# Patient Record
Sex: Male | Born: 1965 | Race: Black or African American | Hispanic: No | Marital: Single | Smoking: Never smoker
Health system: Southern US, Community
[De-identification: ages and names within clinical notes are randomized; demographics above are authoritative.]

## PROBLEM LIST (undated history)

## (undated) HISTORY — PX: SKIN GRAFT: SHX250

## (undated) HISTORY — PX: APPENDECTOMY: SHX54

---

## 2013-04-05 ENCOUNTER — Emergency Department (HOSPITAL_COMMUNITY)
Admission: EM | Admit: 2013-04-05 | Discharge: 2013-04-05 | Disposition: A | Discharge status: Home / Self Care | Payer: BC Managed Care – PPO | Attending: Emergency Medicine | Admitting: Emergency Medicine

## 2013-04-05 ENCOUNTER — Encounter (HOSPITAL_COMMUNITY): Payer: Self-pay | Admitting: Emergency Medicine

## 2013-04-05 DIAGNOSIS — B9789 Other viral agents as the cause of diseases classified elsewhere: Secondary | ICD-10-CM | POA: Insufficient documentation

## 2013-04-05 DIAGNOSIS — J3489 Other specified disorders of nose and nasal sinuses: Secondary | ICD-10-CM | POA: Insufficient documentation

## 2013-04-05 DIAGNOSIS — J029 Acute pharyngitis, unspecified: Secondary | ICD-10-CM

## 2013-04-05 LAB — RAPID STREP SCREEN (MED CTR MEBANE ONLY): Streptococcus, Group A Screen (Direct): NEGATIVE

## 2013-04-05 NOTE — ED Notes (Signed)
Pt dc to home. Pt sts understanding to dc instructions. Pt ambulatory to exit without difficulty. 

## 2013-04-05 NOTE — ED Provider Notes (Signed)
History  This chart was scribed for non-physician practitioner Magnus Sinning, working with Doug Sou, MD, by Yevette Edwards, ED Scribe. This patient was seen in room TR08C/TR08C and the patient's care was started at 6:35 PM.   CSN: [id number]  Arrival date & time 04/05/13  1600   First MD Initiated Contact with Patient 04/05/13 1726      Chief Complaint  Patient presents with  . Sore Throat    The history is provided by the patient. No language interpreter was used.   HPI Comments: Greg Bean is a 47 y.o. male who presents to the Emergency Department complaining of gradual onset, constant pain to the left side of his throat which began three days ago and which became worse this morning. He states that he felt that the pain in his throat is worse when he swallows.  He denies any fever, ear pain, difficulty swallowing, difficulty speaking, SOB, cough, or a headache. Last week, he reports that he had a runny nose. He states that he took Goody's  this morning as well as half of a norco leftover from his root canal, but neither mitigated the pain.   History reviewed. No pertinent past medical history.  Past Surgical History  Procedure Laterality Date  . Skin graft    . Appendectomy      No family history on file.  History  Substance Use Topics  . Smoking status: Never Smoker   . Smokeless tobacco: Not on file  . Alcohol Use: Yes      Review of Systems  Constitutional: Negative for fever and chills.  HENT: Positive for sore throat and rhinorrhea. Negative for ear pain, congestion, trouble swallowing and sinus pressure.     Allergies  Review of patient's allergies indicates no known allergies.  Home Medications  No current outpatient prescriptions on file.  Triage Vitals. BP 153/84  Pulse 83  Temp(Src) 98.7 F (37.1 C) (Oral)  Resp 20  SpO2 100%  Physical Exam  Nursing note and vitals reviewed. Constitutional: He is oriented to person, place, and  time. He appears well-developed and well-nourished. No distress.  HENT:  Head: Normocephalic and atraumatic. No trismus in the jaw.  Right Ear: Tympanic membrane, external ear and ear canal normal.  Mouth/Throat: Uvula is midline and mucous membranes are normal. No edematous. Posterior oropharyngeal erythema present. No oropharyngeal exudate, posterior oropharyngeal edema or tonsillar abscesses.  Some erythema in his throat. No exudate and no edema. Normal voice phonation.  No drooling.  Handling secretions well.  Eyes: EOM are normal.  Neck: Normal range of motion. Neck supple. No tracheal deviation present.  Cardiovascular: Normal rate, regular rhythm and normal heart sounds.   Pulmonary/Chest: Effort normal and breath sounds normal. No respiratory distress.  Musculoskeletal: Normal range of motion.  Lymphadenopathy:    He has cervical adenopathy.  Anterior cervical lymph nodes   Neurological: He is alert and oriented to person, place, and time.  Skin: Skin is warm and dry.  Psychiatric: He has a normal mood and affect. His behavior is normal.    ED Course  Procedures (including critical care time)  DIAGNOSTIC STUDIES: Oxygen Saturation is 100% on room air, normal by my interpretation.    COORDINATION OF CARE:  6:36 PM- Discussed the pt's enlarged lymph nodes which indicates a possible infection. Informed pt that it is most likely not a sinus infection or an ear infection, but that a rapid strep culture will be done. Explained that if it is not  strep, then it may be a viral infection. Pt agreed.   6:59 PM- Informed pt that his strep test was negative.     Labs Reviewed  RAPID STREP SCREEN  CULTURE, GROUP A STREP   No results found.   No diagnosis found.    MDM  Patient presents with a chief complaint of sore throat.  Rapid strep negative.  No drooling, difficulty speaking, or difficulty swallowing.  Most likely Viral Pharyngitis.  Pt stable for discharge.  Return  precautions discussed.  I personally performed the services described in this documentation, which was scribed in my presence. The recorded information has been reviewed and is accurate.    Pascal Lux Brandt, PA-C 04/05/13 1924

## 2013-04-05 NOTE — ED Notes (Signed)
Pt c/o sore throat x 3 days. Pt has been taking ibuprofen with some relief. Pt denies shortness of breath. Pt talking in complete sentences without difficulty.

## 2013-04-05 NOTE — ED Notes (Signed)
PA at bedside.

## 2013-04-06 NOTE — ED Provider Notes (Signed)
Medical screening examination/treatment/procedure(s) were performed by non-physician practitioner and as supervising physician I was immediately available for consultation/collaboration.  Azavier Creson, MD 04/06/13 0128 

## 2013-04-07 LAB — CULTURE, GROUP A STREP

## 2013-10-30 ENCOUNTER — Emergency Department (HOSPITAL_COMMUNITY)
Admission: EM | Admit: 2013-10-30 | Discharge: 2013-10-30 | Disposition: A | Discharge status: Home / Self Care | Payer: BC Managed Care – PPO | Attending: Emergency Medicine | Admitting: Emergency Medicine

## 2013-10-30 ENCOUNTER — Encounter (HOSPITAL_COMMUNITY): Payer: Self-pay | Admitting: Emergency Medicine

## 2013-10-30 DIAGNOSIS — G479 Sleep disorder, unspecified: Secondary | ICD-10-CM | POA: Insufficient documentation

## 2013-10-30 DIAGNOSIS — K649 Unspecified hemorrhoids: Secondary | ICD-10-CM

## 2013-10-30 MED ORDER — POLYETHYLENE GLYCOL 3350 17 GM/SCOOP PO POWD: 17.0000 g | Freq: Two times a day (BID) | ORAL | Status: DC

## 2013-10-30 MED ORDER — OXYCODONE-ACETAMINOPHEN 5-325 MG PO TABS: 2.0000 | ORAL_TABLET | ORAL | Status: DC | PRN

## 2013-10-30 MED ORDER — HYDROCORTISONE 2.5 % RE CREA: TOPICAL_CREAM | RECTAL | Status: DC

## 2013-10-30 NOTE — ED Provider Notes (Signed)
CSN: [id number]     Arrival date & time 10/30/13  1429 History  This chart was scribed for non-physician practitioner working with Audree CamelScott T Goldston, MD by Ashley JacobsBrittany Andrews, ED scribe. This patient was seen in room TR11C/TR11C and the patient's care was started at 3:58 PM.   First MD Initiated Contact with Patient 10/30/13 1511     Chief Complaint  Patient presents with  . Rectal Pain   (Consider location/radiation/quality/duration/timing/severity/associated sxs/prior Treatment) The history is provided by the patient and medical records. No language interpreter was used.   HPI Comments: Greg Bean is a 48 y.o. male who presents to the Emergency Department complaining of constant, moderate, rectal pain for the past three days. Pt states having pain while passing stool. Pt has tried ibuprofen with no relief. He does not have any known allergies to medications. While sleeping at night the pain is worse. Pt does not have any pertinent medical complications. He denies complications with straining while passing stool while passing stool. 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  History reviewed. No pertinent past medical history. Past Surgical History  Procedure Laterality Date  . Skin graft    . Appendectomy     No family history on file. History  Substance Use Topics  . Smoking status: Never Smoker   . Smokeless tobacco: Not on file  . Alcohol Use: Yes    Review of Systems  Gastrointestinal: Positive for rectal pain.  Psychiatric/Behavioral: Positive for sleep disturbance.    Allergies  Review of patient's allergies indicates no known allergies.  Home Medications  No current outpatient prescriptions on file. BP 146/97  Pulse 87  Temp(Src) 98.5 F (36.9 C)  Resp 16  SpO2 98% Physical Exam  Nursing note and vitals reviewed. Constitutional: He is oriented to person, place, and time. He appears well-developed and well-nourished. No distress.  HENT:  Head:  Normocephalic and atraumatic.  Mouth/Throat: Oropharynx is clear and moist.  Eyes: Conjunctivae are normal. Pupils are equal, round, and reactive to light. No scleral icterus.  Neck: Neck supple.  Cardiovascular: Normal rate.   Pulmonary/Chest: Effort normal and breath sounds normal. No stridor. No respiratory distress.  Abdominal: Soft.  Genitourinary:  Grape sized hemorrhoid at the 10 o'clock position Not thrombosed Tender to palpate  Musculoskeletal: Normal range of motion.  Neurological: He is alert and oriented to person, place, and time.  Skin: Skin is warm and dry.  Psychiatric: He has a normal mood and affect. His behavior is normal.    ED Course  Procedures (including critical care time) DIAGNOSTIC STUDIES: Oxygen Saturation is 98% on room air, normal by my interpretation.    COORDINATION OF CARE:  4:02 PM Discussed course of care with pt . Pt understands and agrees.    Labs Review Labs Reviewed - No data to display Imaging Review No results found.  EKG Interpretation   None       MDM   1. Hemorrhoids    4:06 PM Patient has hemorrhoids. No thrombosis noted. Patient will be discharged with percocet for pain, anusol and miralax. Patient advised to follow up with his PCP. Vitals stable and patient afebrile.   I personally performed the services described in this documentation, which was scribed in my presence. The recorded information has been reviewed and is accurate.     Emilia BeckKaitlyn Levander Katzenstein, PA-C 10/30/13 1606

## 2013-10-30 NOTE — ED Provider Notes (Signed)
Medical screening examination/treatment/procedure(s) were performed by non-physician practitioner and as supervising physician I was immediately available for consultation/collaboration.  EKG Interpretation   None         Audree CamelScott T Chauncey Sciulli, MD 10/30/13 [phone]

## 2013-10-30 NOTE — ED Notes (Signed)
Has had rectal pain since Monday   Thinks he has a Hemorid  Motrin not helping

## 2013-10-30 NOTE — Discharge Instructions (Signed)
Take Percocet as needed for pain. Take miralax to help stools pass more comfortably. Use anusol as directed for pain and inflammation relief. Refer to attached documents for more information. Follow up with your doctor for further evaluation.

## 2020-12-01 ENCOUNTER — Other Ambulatory Visit: Payer: Self-pay | Admitting: Gastroenterology

## 2021-02-20 ENCOUNTER — Emergency Department (HOSPITAL_COMMUNITY): Payer: 59

## 2021-02-20 ENCOUNTER — Encounter (HOSPITAL_COMMUNITY): Payer: Self-pay | Admitting: Emergency Medicine

## 2021-02-20 ENCOUNTER — Emergency Department (HOSPITAL_COMMUNITY)
Admission: EM | Admit: 2021-02-20 | Discharge: 2021-02-20 | Disposition: A | Discharge status: Home / Self Care | Payer: 59 | Attending: Emergency Medicine | Admitting: Emergency Medicine

## 2021-02-20 ENCOUNTER — Other Ambulatory Visit: Payer: Self-pay

## 2021-02-20 DIAGNOSIS — M25511 Pain in right shoulder: Secondary | ICD-10-CM | POA: Diagnosis present

## 2021-02-20 DIAGNOSIS — R030 Elevated blood-pressure reading, without diagnosis of hypertension: Secondary | ICD-10-CM

## 2021-02-20 MED ORDER — TRAMADOL HCL 50 MG PO TABS: 50.0000 mg | ORAL_TABLET | Freq: Four times a day (QID) | ORAL | 0 refills | Status: DC | PRN

## 2021-02-20 MED ORDER — IBUPROFEN 400 MG PO TABS
400.0000 mg | ORAL_TABLET | Freq: Once | ORAL | Status: AC
  Administered 2021-02-20: 400 mg via ORAL
  Filled 2021-02-20: qty 1

## 2021-02-20 MED ORDER — TRAMADOL HCL 50 MG PO TABS
50.0000 mg | ORAL_TABLET | Freq: Once | ORAL | Status: AC
  Administered 2021-02-20: 50 mg via ORAL
  Filled 2021-02-20: qty 1

## 2021-02-20 NOTE — ED Triage Notes (Addendum)
R shoulder pain x 3 days.  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  Pain only with movement.

## 2021-02-20 NOTE — ED Provider Notes (Signed)
[facility] EMERGENCY DEPARTMENT Provider Note   CSN: [id number] Arrival date & time: 02/20/21  1452     History Chief Complaint  Patient presents with  . Shoulder Pain    Greg Bean is a 55 y.o. male.  Patient c/o right shoulder pain for the past 3-4 days. Symptoms acute onset, moderate, dull, non radiating, constant, persistent, worse w certain movements including abduction. States works in [employer], and job does involve repetitive lifting and use of shoulders but denies specific trauma or injury. Denies neck/radicular pain. No elbow pain. No arm swelling. No skin changes, rash, redness or increased warmth to shoulder. Pt denies fever or chills (t 100 in ED). Denies other joint pain. States otherwise feels well, not ill.   The history is provided by the patient.  Shoulder Pain Associated symptoms: no back pain, no fever and no neck pain        History reviewed. No pertinent past medical history.  There are no problems to display for this patient.   Past Surgical History:  Procedure Laterality Date  . APPENDECTOMY    . SKIN GRAFT         No family history on file.  Social History   Tobacco Use  . Smoking status: Never Smoker  Substance Use Topics  . Alcohol use: Yes  . Drug use: No    Home Medications Prior to Admission medications   Medication Sig Start Date End Date Taking? Authorizing Provider  hydrocortisone (ANUSOL-HC) 2.5 % rectal cream Apply rectally 2 times daily 10/30/13   Emilia Beck, PA-C  oxyCODONE-acetaminophen (PERCOCET/ROXICET) 5-325 MG per tablet Take 2 tablets by mouth every 4 (four) hours as needed for severe pain. 10/30/13   Szekalski, Yvonna Alanis, PA-C  polyethylene glycol powder (GLYCOLAX/MIRALAX) powder Take 17 g by mouth 2 (two) times daily. Until daily soft stools  OTC 10/30/13   Emilia Beck, PA-C    Allergies    Patient has no known allergies.  Review of Systems   Review of Systems   Constitutional: Negative for chills, diaphoresis and fever.  HENT: Negative for sore throat.   Eyes: Negative for redness.  Respiratory: Negative for cough and shortness of breath.   Cardiovascular: Negative for chest pain.  Gastrointestinal: Negative for abdominal pain, nausea and vomiting.  Genitourinary: Negative for dysuria and flank pain.  Musculoskeletal: Negative for back pain and neck pain.  Skin: Negative for rash.  Neurological: Negative for weakness, numbness and headaches.  Hematological: Does not bruise/bleed easily.  Psychiatric/Behavioral: Negative for confusion.    Physical Exam Updated Vital Signs BP (!) 148/87 (BP Location: Left Arm)   Pulse 95   Temp (!) 100.4 F (38 C) (Oral)   Resp 16   SpO2 100%   Physical Exam Vitals and nursing note reviewed.  Constitutional:      Appearance: Normal appearance. He is well-developed.  HENT:     Head: Atraumatic.     Nose: Nose normal.     Mouth/Throat:     Mouth: Mucous membranes are moist.     Pharynx: Oropharynx is clear.  Eyes:     General: No scleral icterus.    Conjunctiva/sclera: Conjunctivae normal.  Neck:     Trachea: No tracheal deviation.     Comments: No stiffness or rigidity.  Cardiovascular:     Rate and Rhythm: Normal rate and regular rhythm.     Pulses: Normal pulses.     Heart sounds: Normal heart sounds. No murmur heard. No friction rub.  No gallop.   Pulmonary:     Effort: Pulmonary effort is normal. No accessory muscle usage or respiratory distress.     Breath sounds: Normal breath sounds.  Abdominal:     General: Bowel sounds are normal. There is no distension.     Palpations: Abdomen is soft.     Tenderness: There is no abdominal tenderness. There is no guarding.  Genitourinary:    Comments: No cva tenderness. Musculoskeletal:        General: No swelling.     Cervical back: Normal range of motion and neck supple. No rigidity.     Comments: Mild tenderness right shoulder. Skin of  region of normal warmth and color. No rash/lesions. Pain w active abduction. With passive rom shoulder, able to range comfortably with no severe/significant pain. No pain w rom elbow. No RUE swelling. Radial pulse 2+. C spine non tender, aligned, normal rom.   Lymphadenopathy:     Cervical: No cervical adenopathy.  Skin:    General: Skin is warm and dry.     Findings: No rash.  Neurological:     Mental Status: He is alert.     Comments: Alert, speech clear. RUE motor/sens grossly intact. stre 5/5.   Psychiatric:        Mood and Affect: Mood normal.     ED Results / Procedures / Treatments   Labs (all labs ordered are listed, but only abnormal results are displayed) Labs Reviewed - No data to display  EKG None  Radiology DG Shoulder Right  Result Date: 02/20/2021 CLINICAL DATA:  RIGHT shoulder pain. EXAM: RIGHT SHOULDER - 2+ VIEW COMPARISON:  None. FINDINGS: Osseous alignment is normal. No fracture line or displaced fracture fragment is seen. No acute-appearing cortical irregularity or osseous lesion. No significant degenerative change at the glenohumeral or acromioclavicular joint spaces. Surrounding soft tissues are unremarkable. IMPRESSION: Negative. Electronically Signed   By: Bary Richard M.D.   On: 02/20/2021 17:34    Procedures Procedures   Medications Ordered in ED Medications  ibuprofen (ADVIL) tablet 400 mg (has no administration in time range)  traMADol (ULTRAM) tablet 50 mg (has no administration in time range)    ED Course  I have reviewed the triage vital signs and the nursing notes.  Pertinent labs & imaging results that were available during my care of the patient were reviewed by me and considered in my medical decision making (see chart for details).    MDM Rules/Calculators/A&P                         Xrays. No meds today or pta.   Ibuprofen po. Ultram po.  Reviewed nursing notes and prior charts for additional history.   Xrays reviewed/interpreted  by me - no fx.   On recheck pt comfortable, no distress. On exam, no findings of septic joint.   Pt currently appears stable for d/c.   Rec pcp f/u.  Return precautions provided.    Final Clinical Impression(s) / ED Diagnoses Final diagnoses:  None    Rx / DC Orders ED Discharge Orders    None       Cathren Laine, MD 02/20/21 [phone]

## 2021-02-20 NOTE — Discharge Instructions (Addendum)
It was our pleasure to provide your ER care today - we hope that you feel better.  May try heat or cold therapy for symptom relief.  You may use sling for comfort as needed for the next 2-3 days.   Take ibuprofen as need for pain. You may also take ultram as need for pain - no driving for the next  6 hours, or when taking ultram.   Follow up with orthopedic doctor in 1 week - call office to arrange appointment.   Also follow up with primary care doctor in the next 1-2 weeks for recheck of blood pressure which is mildly high today.   Return to ER if worse, new symptoms, high fevers, increased swelling, redness, severe or intractable pain, or other concern.   You m

## 2021-03-04 ENCOUNTER — Encounter (HOSPITAL_COMMUNITY): Payer: Self-pay | Admitting: Internal Medicine

## 2021-03-04 ENCOUNTER — Emergency Department (HOSPITAL_COMMUNITY): Payer: 59

## 2021-03-04 ENCOUNTER — Inpatient Hospital Stay (HOSPITAL_COMMUNITY)
Admission: EM | Admit: 2021-03-04 | Discharge: 2021-03-23 | DRG: 853 | Disposition: E | Payer: 59 | Attending: Internal Medicine | Admitting: Internal Medicine

## 2021-03-04 ENCOUNTER — Other Ambulatory Visit: Payer: Self-pay

## 2021-03-04 DIAGNOSIS — R52 Pain, unspecified: Secondary | ICD-10-CM

## 2021-03-04 DIAGNOSIS — R0600 Dyspnea, unspecified: Secondary | ICD-10-CM

## 2021-03-04 DIAGNOSIS — K7200 Acute and subacute hepatic failure without coma: Secondary | ICD-10-CM | POA: Diagnosis not present

## 2021-03-04 DIAGNOSIS — R0902 Hypoxemia: Secondary | ICD-10-CM

## 2021-03-04 DIAGNOSIS — D6959 Other secondary thrombocytopenia: Secondary | ICD-10-CM | POA: Diagnosis present

## 2021-03-04 DIAGNOSIS — D696 Thrombocytopenia, unspecified: Secondary | ICD-10-CM

## 2021-03-04 DIAGNOSIS — R310 Gross hematuria: Secondary | ICD-10-CM | POA: Diagnosis present

## 2021-03-04 DIAGNOSIS — Z79899 Other long term (current) drug therapy: Secondary | ICD-10-CM

## 2021-03-04 DIAGNOSIS — R531 Weakness: Secondary | ICD-10-CM | POA: Diagnosis not present

## 2021-03-04 DIAGNOSIS — D62 Acute posthemorrhagic anemia: Secondary | ICD-10-CM | POA: Diagnosis present

## 2021-03-04 DIAGNOSIS — J9601 Acute respiratory failure with hypoxia: Secondary | ICD-10-CM | POA: Diagnosis not present

## 2021-03-04 DIAGNOSIS — K7030 Alcoholic cirrhosis of liver without ascites: Secondary | ICD-10-CM | POA: Diagnosis present

## 2021-03-04 DIAGNOSIS — A4101 Sepsis due to Methicillin susceptible Staphylococcus aureus: Secondary | ICD-10-CM | POA: Diagnosis present

## 2021-03-04 DIAGNOSIS — Z20822 Contact with and (suspected) exposure to covid-19: Secondary | ICD-10-CM | POA: Diagnosis present

## 2021-03-04 DIAGNOSIS — M659 Synovitis and tenosynovitis, unspecified: Secondary | ICD-10-CM | POA: Diagnosis present

## 2021-03-04 DIAGNOSIS — R0602 Shortness of breath: Secondary | ICD-10-CM

## 2021-03-04 DIAGNOSIS — S37011A Minor contusion of right kidney, initial encounter: Secondary | ICD-10-CM | POA: Diagnosis present

## 2021-03-04 DIAGNOSIS — B9561 Methicillin susceptible Staphylococcus aureus infection as the cause of diseases classified elsewhere: Secondary | ICD-10-CM

## 2021-03-04 DIAGNOSIS — D689 Coagulation defect, unspecified: Secondary | ICD-10-CM | POA: Diagnosis not present

## 2021-03-04 DIAGNOSIS — R6520 Severe sepsis without septic shock: Secondary | ICD-10-CM | POA: Diagnosis present

## 2021-03-04 DIAGNOSIS — D638 Anemia in other chronic diseases classified elsewhere: Secondary | ICD-10-CM | POA: Diagnosis present

## 2021-03-04 DIAGNOSIS — R7881 Bacteremia: Secondary | ICD-10-CM

## 2021-03-04 DIAGNOSIS — Z832 Family history of diseases of the blood and blood-forming organs and certain disorders involving the immune mechanism: Secondary | ICD-10-CM

## 2021-03-04 DIAGNOSIS — D649 Anemia, unspecified: Secondary | ICD-10-CM | POA: Diagnosis not present

## 2021-03-04 DIAGNOSIS — K7460 Unspecified cirrhosis of liver: Secondary | ICD-10-CM | POA: Diagnosis present

## 2021-03-04 DIAGNOSIS — G9340 Encephalopathy, unspecified: Secondary | ICD-10-CM | POA: Diagnosis present

## 2021-03-04 DIAGNOSIS — D72825 Bandemia: Secondary | ICD-10-CM | POA: Diagnosis not present

## 2021-03-04 DIAGNOSIS — R6510 Systemic inflammatory response syndrome (SIRS) of non-infectious origin without acute organ dysfunction: Secondary | ICD-10-CM | POA: Diagnosis not present

## 2021-03-04 DIAGNOSIS — M25539 Pain in unspecified wrist: Secondary | ICD-10-CM

## 2021-03-04 DIAGNOSIS — M609 Myositis, unspecified: Secondary | ICD-10-CM | POA: Diagnosis present

## 2021-03-04 DIAGNOSIS — N2889 Other specified disorders of kidney and ureter: Secondary | ICD-10-CM | POA: Diagnosis present

## 2021-03-04 DIAGNOSIS — R509 Fever, unspecified: Secondary | ICD-10-CM

## 2021-03-04 DIAGNOSIS — I76 Septic arterial embolism: Secondary | ICD-10-CM | POA: Diagnosis present

## 2021-03-04 DIAGNOSIS — N179 Acute kidney failure, unspecified: Secondary | ICD-10-CM | POA: Diagnosis present

## 2021-03-04 DIAGNOSIS — F1010 Alcohol abuse, uncomplicated: Secondary | ICD-10-CM | POA: Diagnosis present

## 2021-03-04 DIAGNOSIS — E871 Hypo-osmolality and hyponatremia: Secondary | ICD-10-CM | POA: Diagnosis present

## 2021-03-04 DIAGNOSIS — R7989 Other specified abnormal findings of blood chemistry: Secondary | ICD-10-CM | POA: Diagnosis present

## 2021-03-04 DIAGNOSIS — Z811 Family history of alcohol abuse and dependence: Secondary | ICD-10-CM | POA: Diagnosis not present

## 2021-03-04 DIAGNOSIS — I868 Varicose veins of other specified sites: Secondary | ICD-10-CM | POA: Diagnosis present

## 2021-03-04 DIAGNOSIS — N390 Urinary tract infection, site not specified: Secondary | ICD-10-CM | POA: Diagnosis present

## 2021-03-04 DIAGNOSIS — D65 Disseminated intravascular coagulation [defibrination syndrome]: Secondary | ICD-10-CM | POA: Diagnosis not present

## 2021-03-04 DIAGNOSIS — K7290 Hepatic failure, unspecified without coma: Secondary | ICD-10-CM | POA: Diagnosis present

## 2021-03-04 DIAGNOSIS — A419 Sepsis, unspecified organism: Secondary | ICD-10-CM | POA: Diagnosis not present

## 2021-03-04 DIAGNOSIS — N172 Acute kidney failure with medullary necrosis: Secondary | ICD-10-CM | POA: Diagnosis not present

## 2021-03-04 DIAGNOSIS — D61818 Other pancytopenia: Secondary | ICD-10-CM | POA: Diagnosis present

## 2021-03-04 DIAGNOSIS — Z8616 Personal history of COVID-19: Secondary | ICD-10-CM

## 2021-03-04 DIAGNOSIS — I469 Cardiac arrest, cause unspecified: Secondary | ICD-10-CM | POA: Diagnosis not present

## 2021-03-04 DIAGNOSIS — M00032 Staphylococcal arthritis, left wrist: Secondary | ICD-10-CM | POA: Diagnosis present

## 2021-03-04 DIAGNOSIS — K429 Umbilical hernia without obstruction or gangrene: Secondary | ICD-10-CM | POA: Diagnosis present

## 2021-03-04 LAB — LACTIC ACID, PLASMA
Lactic Acid, Venous: 2.9 mmol/L (ref 0.5–1.9)
Lactic Acid, Venous: 1.9 mmol/L (ref 0.5–1.9)

## 2021-03-04 LAB — CBC WITH DIFFERENTIAL/PLATELET
Abs Immature Granulocytes: 0.3 10*3/uL — ABNORMAL HIGH (ref 0.00–0.07)
Basophils Absolute: 0.1 10*3/uL (ref 0.0–0.1)
Basophils Relative: 0 %
Eosinophils Absolute: 0.1 10*3/uL (ref 0.0–0.5)
Eosinophils Relative: 0 %
HCT: 23.2 % — ABNORMAL LOW (ref 39.0–52.0)
Hemoglobin: 7.8 g/dL — ABNORMAL LOW (ref 13.0–17.0)
Immature Granulocytes: 2 %
Lymphocytes Relative: 9 %
Lymphs Abs: 1.8 10*3/uL (ref 0.7–4.0)
MCH: 30.6 pg (ref 26.0–34.0)
MCHC: 33.6 g/dL (ref 30.0–36.0)
MCV: 91 fL (ref 80.0–100.0)
Monocytes Absolute: 0.5 10*3/uL (ref 0.1–1.0)
Monocytes Relative: 3 %
Neutro Abs: 16.2 10*3/uL — ABNORMAL HIGH (ref 1.7–7.7)
Neutrophils Relative %: 86 %
Platelets: 62 10*3/uL — ABNORMAL LOW (ref 150–400)
RBC: 2.55 MIL/uL — ABNORMAL LOW (ref 4.22–5.81)
RDW: 17.1 % — ABNORMAL HIGH (ref 11.5–15.5)
WBC: 18.9 10*3/uL — ABNORMAL HIGH (ref 4.0–10.5)
nRBC: 0.2 % (ref 0.0–0.2)

## 2021-03-04 LAB — COMPREHENSIVE METABOLIC PANEL
ALT: 41 U/L (ref 0–44)
AST: 76 U/L — ABNORMAL HIGH (ref 15–41)
Albumin: 1.6 g/dL — ABNORMAL LOW (ref 3.5–5.0)
Alkaline Phosphatase: 81 U/L (ref 38–126)
Anion gap: 9 (ref 5–15)
BUN: 41 mg/dL — ABNORMAL HIGH (ref 6–20)
CO2: 22 mmol/L (ref 22–32)
Calcium: 7.7 mg/dL — ABNORMAL LOW (ref 8.9–10.3)
Chloride: 95 mmol/L — ABNORMAL LOW (ref 98–111)
Creatinine, Ser: 1.5 mg/dL — ABNORMAL HIGH (ref 0.61–1.24)
GFR, Estimated: 55 mL/min — ABNORMAL LOW (ref >60)
Glucose, Bld: 108 mg/dL — ABNORMAL HIGH (ref 70–99)
Potassium: 3.9 mmol/L (ref 3.5–5.1)
Sodium: 126 mmol/L — ABNORMAL LOW (ref 135–145)
Total Bilirubin: 5.1 mg/dL — ABNORMAL HIGH (ref 0.3–1.2)
Total Protein: 8.3 g/dL — ABNORMAL HIGH (ref 6.5–8.1)

## 2021-03-04 LAB — URINALYSIS, ROUTINE W REFLEX MICROSCOPIC
Bilirubin Urine: NEGATIVE
Glucose, UA: 50 mg/dL — AB
Ketones, ur: NEGATIVE mg/dL
Leukocytes,Ua: NEGATIVE
Nitrite: NEGATIVE
Protein, ur: 100 mg/dL — AB
RBC / HPF: >50 RBC/hpf — ABNORMAL HIGH (ref 0–5)
Specific Gravity, Urine: 1.011 (ref 1.005–1.030)
pH: 6 (ref 5.0–8.0)

## 2021-03-04 LAB — RETICULOCYTES
Immature Retic Fract: 28 % — ABNORMAL HIGH (ref 2.3–15.9)
RBC.: 2.56 MIL/uL — ABNORMAL LOW (ref 4.22–5.81)
Retic Count, Absolute: 64 10*3/uL (ref 19.0–186.0)
Retic Ct Pct: 2.5 % (ref 0.4–3.1)

## 2021-03-04 LAB — RAPID URINE DRUG SCREEN, HOSP PERFORMED
Amphetamines: NOT DETECTED
Barbiturates: NOT DETECTED
Benzodiazepines: NOT DETECTED
Cocaine: NOT DETECTED
Opiates: NOT DETECTED
Tetrahydrocannabinol: NOT DETECTED

## 2021-03-04 LAB — RESP PANEL BY RT-PCR (FLU A&B, COVID) ARPGX2
Influenza A by PCR: NEGATIVE
Influenza B by PCR: NEGATIVE
SARS Coronavirus 2 by RT PCR: NEGATIVE

## 2021-03-04 LAB — ABO/RH: ABO/RH(D): B POS

## 2021-03-04 LAB — PROTIME-INR
INR: 2.7 — ABNORMAL HIGH (ref 0.8–1.2)
Prothrombin Time: 28.7 seconds — ABNORMAL HIGH (ref 11.4–15.2)

## 2021-03-04 LAB — PREPARE RBC (CROSSMATCH)

## 2021-03-04 LAB — MAGNESIUM: Magnesium: 2 mg/dL (ref 1.7–2.4)

## 2021-03-04 LAB — AMMONIA: Ammonia: 48 umol/L — ABNORMAL HIGH (ref 9–35)

## 2021-03-04 LAB — LIPASE, BLOOD: Lipase: 47 U/L (ref 11–51)

## 2021-03-04 LAB — ETHANOL: Alcohol, Ethyl (B): <10 mg/dL (ref <10)

## 2021-03-04 LAB — APTT: aPTT: 35 seconds (ref 24–36)

## 2021-03-04 LAB — FOLATE: Folate: 11 ng/mL (ref >5.9)

## 2021-03-04 MED ORDER — SODIUM CHLORIDE 0.9% IV SOLUTION: Freq: Once | INTRAVENOUS | Status: AC

## 2021-03-04 MED ORDER — ONDANSETRON HCL 4 MG/2ML IJ SOLN
4.0000 mg | Freq: Four times a day (QID) | INTRAMUSCULAR | Status: DC | PRN
  Administered 2021-03-10: 4 mg via INTRAVENOUS
  Filled 2021-03-04: qty 2

## 2021-03-04 MED ORDER — ACETAMINOPHEN 325 MG PO TABS: 650.0000 mg | ORAL_TABLET | Freq: Four times a day (QID) | ORAL | Status: DC | PRN

## 2021-03-04 MED ORDER — ALBUMIN HUMAN 25 % IV SOLN
50.0000 g | Freq: Once | INTRAVENOUS | Status: DC
  Filled 2021-03-04: qty 200

## 2021-03-04 MED ORDER — ACETAMINOPHEN 650 MG RE SUPP: 650.0000 mg | Freq: Four times a day (QID) | RECTAL | Status: DC | PRN

## 2021-03-04 MED ORDER — PHYTONADIONE 5 MG PO TABS: 2.5000 mg | ORAL_TABLET | Freq: Once | ORAL | Status: DC

## 2021-03-04 MED ORDER — FENTANYL CITRATE (PF) 100 MCG/2ML IJ SOLN
25.0000 ug | INTRAMUSCULAR | Status: DC | PRN
  Administered 2021-03-05 – 2021-03-07 (×4): 50 ug via INTRAVENOUS
  Administered 2021-03-10: 25 ug via INTRAVENOUS
  Administered 2021-03-10: 50 ug via INTRAVENOUS
  Filled 2021-03-04 (×6): qty 2

## 2021-03-04 MED ORDER — ALBUMIN HUMAN 25 % IV SOLN: 12.5000 g | Freq: Once | INTRAVENOUS | Status: DC

## 2021-03-04 MED ORDER — LACTATED RINGERS IV BOLUS
1000.0000 mL | Freq: Once | INTRAVENOUS | Status: AC
  Administered 2021-03-04: 1000 mL via INTRAVENOUS

## 2021-03-04 MED ORDER — PHYTONADIONE 5 MG PO TABS
10.0000 mg | ORAL_TABLET | Freq: Once | ORAL | Status: AC
  Administered 2021-03-04: 10 mg via ORAL
  Filled 2021-03-04: qty 2

## 2021-03-04 MED ORDER — LACTATED RINGERS IV BOLUS (SEPSIS)
1000.0000 mL | Freq: Once | INTRAVENOUS | Status: AC
  Administered 2021-03-04: 1000 mL via INTRAVENOUS

## 2021-03-04 MED ORDER — ONDANSETRON HCL 4 MG PO TABS: 4.0000 mg | ORAL_TABLET | Freq: Four times a day (QID) | ORAL | Status: DC | PRN

## 2021-03-04 MED ORDER — ACETAMINOPHEN 325 MG PO TABS
650.0000 mg | ORAL_TABLET | Freq: Once | ORAL | Status: AC
  Administered 2021-03-04: 650 mg via ORAL
  Filled 2021-03-04: qty 2

## 2021-03-04 MED ORDER — LACTATED RINGERS IV SOLN: INTRAVENOUS | Status: AC

## 2021-03-04 MED ORDER — FENTANYL CITRATE (PF) 100 MCG/2ML IJ SOLN
50.0000 ug | Freq: Once | INTRAMUSCULAR | Status: AC
Start: 2021-03-04 — End: 2021-03-04
  Administered 2021-03-04: 50 ug via INTRAVENOUS
  Filled 2021-03-04: qty 2

## 2021-03-04 MED ORDER — SODIUM CHLORIDE 0.9 % IV SOLN
1.0000 g | INTRAVENOUS | Status: DC
  Administered 2021-03-04: 1 g via INTRAVENOUS
  Filled 2021-03-04: qty 10

## 2021-03-04 NOTE — ED Triage Notes (Signed)
Patient arrives via EMS- family reported that the patient has had weight loss over the last 2 weeks and has appeared weak.  Reported that the patient had Covid in January and has not felt normal since.  The patient reports weakness, moments of confusion and urinating blood x 2 weeks

## 2021-03-04 NOTE — Progress Notes (Signed)
Case reviewed with ER provider.  Recommend IVF resuscitation to improve renal function before proceeding with CT angio, start pRBC transfusion, correct INR and continue Rocephin for empiric abx coverage.  No acute surgical intervention from a GU standpoint at this time. If hemorrhaging persists, IR will need to be consulted for angioembolization. Will continue to follow.

## 2021-03-04 NOTE — H&P (Addendum)
History and Physical    Greg Bean ZOX:[id number] DOB: [birth year] DOA: 28-Sep-2021  PCP: Pcp, No  Patient coming from: Home  I have personally briefly reviewed patient's [facility]  Chief Complaint: Hematuria  HPI: Greg Bean is a 55 y.o. male with medical history significant of no known chronic medical conditions.  Does admit to drinking EtOH daily prior to COVID illness a few months ago, but no drinking since that time.  Around Nov he developed intermittent bouts of hematuria.  These have persisted, worsened, now has had hematuria for past 2 weeks straight.  Also reports wt loss and confusion during that time frame.  Associated flank pain.  No subjective fever, dysuria, abd pain, CP, SOB, N/V/D   ED Course: Tm 100.7, WBC 18.9k.  HR 146, improved to 104 after 2L bolus.  RR 31, creat 1.5, BUN 41, hgb 7.8, platelets 62.  INR 2.7, lactate 2.9, AST 76, ALT 41, Tbili 5.1.  UA with mod HGB, > 50 RBC, gross hematuria.  CT AP w/o contrast: 1) cirrhosis, with splenic varicies 2) 5.x cm enhancing R renal lesion suggestive of hematoma, recd repeating CTA AP.  Urology: try to improve renal fxn to GFR > 60 then get CTA.  GI to see in AM.   Review of Systems: As per HPI, otherwise all review of systems negative.  No past medical history on file.  Past Surgical History:  Procedure Laterality Date  . APPENDECTOMY    . SKIN GRAFT       reports that he has never smoked. He does not have any smokeless tobacco history on file. He reports previous alcohol use. He reports that he does not use drugs.  No Known Allergies  Family History  Problem Relation Age of Onset  . Cirrhosis Father   . Alcohol abuse Father      Prior to Admission medications   Medication Sig Start Date End Date Taking? Authorizing Provider  hydrocortisone (ANUSOL-HC) 2.5 % rectal cream Apply rectally 2 times daily 10/30/13   Emilia BeckSzekalski, Kaitlyn, PA-C   oxyCODONE-acetaminophen (PERCOCET/ROXICET) 5-325 MG per tablet Take 2 tablets by mouth every 4 (four) hours as needed for severe pain. 10/30/13   Szekalski, Yvonna AlanisKaitlyn, PA-C  polyethylene glycol powder (GLYCOLAX/MIRALAX) powder Take 17 g by mouth 2 (two) times daily. Until daily soft stools  OTC 10/30/13   Szekalski, ChamberlayneKaitlyn, PA-C  traMADol (ULTRAM) 50 MG tablet Take 1 tablet (50 mg total) by mouth every 6 (six) hours as needed. 02/20/21   Cathren LaineSteinl, Kevin, MD    Physical Exam: Vitals:   01/20/21 2015 01/20/21 2030 01/20/21 2045 01/20/21 2100  BP: 95/64 (!) 104/58 (!) 105/59 (!) 110/58  Pulse: (!) 103 (!) 103 (!) 103 (!) 104  Resp: (!) 31 (!) 30 (!) 30 (!) 31  Temp:      TempSrc:      SpO2: 93% 92% 93% 92%  Weight:      Height:        Constitutional: NAD, calm, comfortable Eyes: PERRL, lids and conjunctivae normal ENMT: Mucous membranes are moist. Posterior pharynx clear of any exudate or lesions.Normal dentition.  Neck: normal, supple, no masses, no thyromegaly Respiratory: clear to auscultation bilaterally, no wheezing, no crackles. Normal respiratory effort. No accessory muscle use.  Cardiovascular: Regular rate and rhythm, no murmurs / rubs / gallops. No extremity edema. 2+ pedal pulses. No carotid bruits.  Abdomen: no tenderness, no masses palpated. No hepatosplenomegaly. Bowel sounds positive.  Musculoskeletal: no clubbing /  cyanosis. No joint deformity upper and lower extremities. Good ROM, no contractures. Normal muscle tone.  Skin: no rashes, lesions, ulcers. No induration Neurologic: CN 2-12 grossly intact. Sensation intact, DTR normal. Strength 5/5 in all 4.  Psychiatric: Normal judgment and insight. Alert and oriented x 3. Normal mood.    Labs on Admission: I have personally reviewed following labs and imaging studies  CBC: Recent Labs  Lab 03/19/2021 1530  WBC 18.9*  NEUTROABS 16.2*  HGB 7.8*  HCT 23.2*  MCV 91.0  PLT 62*   Basic Metabolic Panel: Recent Labs  Lab  03/14/2021 1619  NA 126*  K 3.9  CL 95*  CO2 22  GLUCOSE 108*  BUN 41*  CREATININE 1.50*  CALCIUM 7.7*  MG 2.0   GFR: Estimated Creatinine Clearance: 47.5 mL/min (A) (by C-G formula based on SCr of 1.5 mg/dL (H)). Liver Function Tests: Recent Labs  Lab 02/27/2021 1619  AST 76*  ALT 41  ALKPHOS 81  BILITOT 5.1*  PROT 8.3*  ALBUMIN 1.6*   Recent Labs  Lab 02/21/2021 1619  LIPASE 47   No results for input(s): AMMONIA in the last 168 hours. Coagulation Profile: Recent Labs  Lab 03/02/2021 1530  INR 2.7*   Cardiac Enzymes: No results for input(s): CKTOTAL, CKMB, CKMBINDEX, TROPONINI in the last 168 hours. BNP (last 3 results) No results for input(s): PROBNP in the last 8760 hours. HbA1C: No results for input(s): HGBA1C in the last 72 hours. CBG: No results for input(s): GLUCAP in the last 168 hours. Lipid Profile: No results for input(s): CHOL, HDL, LDLCALC, TRIG, CHOLHDL, LDLDIRECT in the last 72 hours. Thyroid Function Tests: No results for input(s): TSH, T4TOTAL, FREET4, T3FREE, THYROIDAB in the last 72 hours. Anemia Panel: Recent Labs    03/20/2021 1530  RETICCTPCT 2.5   Urine analysis:    Component Value Date/Time   COLORURINE RED (A) 02/25/2021 1528   APPEARANCEUR HAZY (A) 03/02/2021 1528   LABSPEC 1.011 02/25/2021 1528   PHURINE 6.0 03/14/2021 1528   GLUCOSEU 50 (A) 02/26/2021 1528   HGBUR MODERATE (A) 03/14/2021 1528   BILIRUBINUR NEGATIVE 03/13/2021 1528   KETONESUR NEGATIVE 03/06/2021 1528   PROTEINUR 100 (A) 03/21/2021 1528   NITRITE NEGATIVE 03/03/2021 1528   LEUKOCYTESUR NEGATIVE 03/07/2021 1528    Radiological Exams on Admission: CT ABDOMEN PELVIS WO CONTRAST  Result Date: 03/21/2021 CLINICAL DATA:  Fever with hematuria EXAM: CT ABDOMEN AND PELVIS WITHOUT CONTRAST TECHNIQUE: Multidetector CT imaging of the abdomen and pelvis was performed following the standard protocol without oral or IV contrast. COMPARISON:  None. FINDINGS: Lower chest:  There are small pleural effusions bilaterally. There is scarring with lower lobe bronchiectatic change in the base regions. There are scattered foci of coronary artery calcification. Hepatobiliary: The liver contour is nodular, likely due to a degree of underlying hepatic cirrhosis. No focal liver lesions are appreciable on this noncontrast enhanced study. Gallbladder wall is not appreciably thickened. There is no biliary duct dilatation. Pancreas: There is no appreciable pancreatic mass or inflammatory focus. Spleen: No splenic lesions are evident. There are apparent varices in the splenic region. Adrenals/Urinary Tract: Adrenals bilaterally appear normal. The right kidney appears edematous. There is mild soft tissue stranding in the perinephric fascia on the right. There is an area of increased attenuation in the upper pole the right kidney measuring 5.5 x 5.3 x 5.2 cm. This lesion has an appearance suggesting focal hemorrhage within this area. This lesion may connect with the upper pole collecting system.  There is no hydronephrosis on the right. There is no demonstrable mass in the left kidney on this noncontrast enhanced study. No hydronephrosis. There is a 3 x 3 mm calculus in the lower pole the right kidney. There is no appreciable ureteral calculus on either side. Urinary bladder is midline with wall thickness within normal limits. Stomach/Bowel: There is no appreciable bowel wall or mesenteric thickening. No appreciable bowel obstruction. Terminal ileum appears normal. Appendix absent. No free air or portal venous air. Vascular/Lymphatic: No abdominal aortic aneurysm. Foci of calcification noted in each common iliac artery. No adenopathy is evident in the abdomen or pelvis. Subcentimeter inguinal lymph nodes are considered nonspecific. Reproductive: Occasional prostatic calculi noted. Prostate and seminal vesicles normal in size and contour. Other: There is a focal umbilical region hernia containing fat and  mild panniculitis. This hernia at its neck measures 1.4 cm from right to left dimension and 1.5 cm from superior to inferior dimension. No bowel containing hernia is evident. There is no abscess or ascites in the abdomen or pelvis. Musculoskeletal: No blastic or lytic bone lesions. No intramuscular lesions. IMPRESSION: 1. Area of increased attenuation in the upper pole of the right kidney measuring 5.5 x 5.3 x 5.2 cm which has an appearance concerning for localized hemorrhage. Etiology for focal hemorrhage in this area is uncertain. Hemorrhage within a mass or vascular malformation could present in this manner. This finding may warrant CT angiogram for further assessment and characterization. This lesion abuts and may invade a portion of the upper pole collecting system which could account for hematuria. Note that the right kidney appears edematous with mild stranding in the right perinephric fascia. 2. Nonobstructing calculus in the lower pole right kidney measuring 3 x 3 mm. 3. 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 No focal liver lesions evident on noncontrast enhanced study. 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 Spleen is not enlarged. 4. Umbilical hernia containing fat and mild panniculitis. No bowel containing hernia evident. 5. No evident bowel obstruction. No abscess in the abdomen or pelvis. Appendix absent. 6. Small pleural effusions bilaterally. Areas of scarring and bronchiectasis in the lung bases. 7.  Foci of iliac artery and coronary artery calcification. Electronically Signed   By: Bretta Bang III M.D.   On: 02/22/2021 20:08   DG Chest Port 1 View  Result Date: 03/02/2021 CLINICAL DATA:  Weakness EXAM: PORTABLE CHEST 1 VIEW COMPARISON:  None. FINDINGS: There is mild fullness of the AP window, possibly due to technique. Heart is normal in size. No pleural effusion. No pneumothorax. No acute pleuroparenchymal abnormality. Visualized  abdomen is unremarkable. No acute osseous abnormality noted. IMPRESSION: 1. Mild fullness of the AP window, likely due to technique. Consider repeat PA and lateral chest radiograph for improved evaluation. 2. Otherwise no acute cardiopulmonary abnormality. Electronically Signed   By: Meda Klinefelter MD   On: 02/20/2021 16:29    EKG: Independently reviewed.  Assessment/Plan Principal Problem:   Renal hemorrhage, right Active Problems:   SIRS (systemic inflammatory response syndrome) (HCC)   Cirrhosis of liver (HCC)   ABLA (acute blood loss anemia)   AKI (acute kidney injury) (HCC)    1. Renal hemorrhage - 1. Correcting coagulopathy as best as possible: 1. 3u FFP 2. 10mg  vit K 3. Repeat INR post transfusions 2. IVF: 2L LR bolus and LR at 100 3. See what albumin is post transfusions (FFP contains albumin I believe so will hold off on ordering  albumin for the moment). 4. Urology consulted: 1. Try to resuscitate for now 2. If creat improves, try to get CTA AP 3. No emergent surgery or IR just yet 2. ABLA - 1. 1u PRBC transfusion 2. Repeat CBC post transfusion 3. Cirrhosis of liver - 1. GI consult to see in AM 2. Repeat CMP post transfusions 3. Ammonia pending 4. AKI - 1. Likely combo of pre-renal due to above as well as direct renal injury due to the hemorrhage from R kidney 2. resuscitate as above and repeat CMP 5. SIRS - 1. Empiric rocephin for the moment 2. BCx 3. UCx  DVT prophylaxis: SCDs Code Status: Full Family Communication: No family in room Disposition Plan: Home after treatment for renal hemorrhage, cirrhosis work up etc Consults called: Dr. Ewing Schlein, Dr. Liliane Shi (GI and urology respectively) Admission status: Admit to inpatient  Severity of Illness: The appropriate patient status for this patient is INPATIENT. Inpatient status is judged to be reasonable and necessary in order to provide the required intensity of service to ensure the patient's safety. The  patient's presenting symptoms, physical exam findings, and initial radiographic and laboratory data in the context of their chronic comorbidities is felt to place them at high risk for further clinical deterioration. Furthermore, it is not anticipated that the patient will be medically stable for discharge from the hospital within 2 midnights of admission. The following factors support the patient status of inpatient.   IP status due to renal hemorrhage, ABLA requiring transfusions, coagulopathy from new dx of apparent cirrhosis, etc.   * I certify that at the point of admission it is my clinical judgment that the patient will require inpatient hospital care spanning beyond 2 midnights from the point of admission due to high intensity of service, high risk for further deterioration and high frequency of surveillance required.*    Greg Bean M. DO [facility]  How to contact the [facility] Attending or Consulting provider 7A - 7P or covering provider during after hours 7P -7A, for this patient?  1. Check the care team in [facility] and look for a) attending/consulting [facility] provider listed and b) the [facility] team listed 2. Log into [email or web address]  [employer] Physician Scheduling and messaging for groups and whole hospitals  On call and physician scheduling software for group practices, residents, hospitalists and other medical providers for call, clinic, rotation and shift schedules. On[employer] is a hospital-wide system for scheduling doctors and paging doctors on call. EasyPlot is for scientific plotting and data analysis.  [email or web address]  and use [place]'s universal password to access. If you do not have the password, please contact the hospital operator.  3. Locate the [facility] provider you are looking for under [facility] and page to a number that you can be directly reached. 4. If you still have difficulty reaching the provider, please page the [facility] (Director on Call) for the Hospitalists listed  on amion for assistance.  03/18/2021, 10:10 PM

## 2021-03-04 NOTE — ED Provider Notes (Signed)
[facility]-EMERGENCY DEPT Provider Note   CSN: [id number] Arrival date & time: 02/20/2021  1427     History Chief Complaint  Patient presents with  . Fever    Greg Bean is a 55 y.o. male.  HPI      Greg Bean is a 55 y.o. male, patient with no known past medical history., presenting to the ED with hematuria for the last 2 weeks. Also endorses fatigue, chills, loss of appetite, and body aches over the same time period. However, the above information was only obtained later in the interview.  Initially, patient states he came to the ED because "my girlfriend kept bugging me to get checked out because I was not eating." Denies any recent antibiotics.  Denies IV drug use, HIV, alcohol use, immune compromise.  Denies known tick bites.  Denies known fever at home, dysuria, rectal pain, abdominal pain, chest pain, shortness of breath, cough, back/flank pain, wounds, neck pain/stiffness, rashes, focused joint pain, N/V/D, or any other complaints.  History reviewed. No pertinent past medical history.  Patient Active Problem List   Diagnosis Date Noted  . Renal hemorrhage, right 02/25/2021  . SIRS (systemic inflammatory response syndrome) (HCC) 03/16/2021  . Cirrhosis of liver (HCC) 03/03/2021  . ABLA (acute blood loss anemia) 02/22/2021  . AKI (acute kidney injury) (HCC) 02/20/2021    Past Surgical History:  Procedure Laterality Date  . APPENDECTOMY    . SKIN GRAFT         Family History  Problem Relation Age of Onset  . Cirrhosis Father   . Alcohol abuse Father     Social History   Tobacco Use  . Smoking status: Never Smoker  Substance Use Topics  . Alcohol use: Not Currently    Comment: Daily drinker prior to Nov.  . Drug use: No    Home Medications Prior to Admission medications   Medication Sig Start Date End Date Taking? Authorizing Provider  hydrocortisone (ANUSOL-HC) 2.5 % rectal cream Apply rectally 2 times daily 10/30/13    Emilia Beck, PA-C  oxyCODONE-acetaminophen (PERCOCET/ROXICET) 5-325 MG per tablet Take 2 tablets by mouth every 4 (four) hours as needed for severe pain. 10/30/13   Szekalski, Yvonna Alanis, PA-C  polyethylene glycol powder (GLYCOLAX/MIRALAX) powder Take 17 g by mouth 2 (two) times daily. Until daily soft stools  OTC 10/30/13   Szekalski, [place], PA-C  traMADol (ULTRAM) 50 MG tablet Take 1 tablet (50 mg total) by mouth every 6 (six) hours as needed. 02/20/21   Cathren Laine, MD    Allergies    Patient has no known allergies.  Review of Systems   Review of Systems  Constitutional: Positive for appetite change, chills and fatigue.  HENT: Negative for congestion, dental problem, ear pain, facial swelling, sore throat, trouble swallowing and voice change.   Respiratory: Negative for cough and shortness of breath.   Gastrointestinal: Negative for abdominal pain, blood in stool, diarrhea, nausea and vomiting.  Genitourinary: Positive for hematuria. Negative for dysuria, flank pain, frequency, penile discharge, penile swelling, scrotal swelling and testicular pain.  Musculoskeletal: Positive for myalgias. Negative for back pain, neck pain and neck stiffness.  Skin: Negative for rash and wound.  Neurological: Negative for dizziness, seizures, syncope, speech difficulty and numbness.  All other systems reviewed and are negative.   Physical Exam Updated Vital Signs BP 125/76 (BP Location: Left Arm)   Pulse (!) 114   Temp (!) 100.7 F (38.2 C) (Oral)   Resp (!) 22  Ht  (1.753 m)   Wt 60.3 kg   SpO2 98%   BMI 19.64 kg/m   Physical Exam Vitals and nursing note reviewed.  Constitutional:      General: He is not in acute distress.    Appearance: He is well-developed. He is ill-appearing. He is not diaphoretic.  HENT:     Head: Normocephalic and atraumatic.     Mouth/Throat:     Mouth: Mucous membranes are moist.     Pharynx: Oropharynx is clear.  Eyes:     General: Scleral icterus  present.     Conjunctiva/sclera: Conjunctivae normal.  Cardiovascular:     Rate and Rhythm: Regular rhythm. Tachycardia present.     Pulses: Normal pulses.          Radial pulses are 2+ on the right side and 2+ on the left side.       Posterior tibial pulses are 2+ on the right side and 2+ on the left side.     Heart sounds: Normal heart sounds.     Comments: Tactile temperature in the extremities appropriate and equal bilaterally. Pulmonary:     Effort: Pulmonary effort is normal. No respiratory distress.     Breath sounds: Normal breath sounds.  Abdominal:     Palpations: Abdomen is soft.     Tenderness: There is no abdominal tenderness. There is no guarding.  Genitourinary:    Comments: Genital Exam: Penis, scrotum, and testicles without swelling, lesions, or tenderness. No penile discharge.   No inguinal hernia noted. No inguinal lymphadenopathy.  Overall normal male genitalia.  RN, Aram Beecham, served as chaperone during the exam. Musculoskeletal:     Cervical back: Neck supple.     Right lower leg: No edema.     Left lower leg: No edema.  Lymphadenopathy:     Cervical: No cervical adenopathy.  Skin:    General: Skin is warm and dry.  Neurological:     Mental Status: He is alert.  Psychiatric:        Mood and Affect: Mood and affect normal.        Speech: Speech normal.        Behavior: Behavior normal.     ED Results / Procedures / Treatments   Labs (all labs ordered are listed, but only abnormal results are displayed) Labs Reviewed  LACTIC ACID, PLASMA - Abnormal; Notable for the following components:      Result Value   Lactic Acid, Venous 2.9 (*)    All other components within normal limits  CBC WITH DIFFERENTIAL/PLATELET - Abnormal; Notable for the following components:   WBC 18.9 (*)    RBC 2.55 (*)    Hemoglobin 7.8 (*)    HCT 23.2 (*)    RDW 17.1 (*)    Platelets 62 (*)    Neutro Abs 16.2 (*)    Abs Immature Granulocytes 0.30 (*)    All other components  within normal limits  PROTIME-INR - Abnormal; Notable for the following components:   Prothrombin Time 28.7 (*)    INR 2.7 (*)    All other components within normal limits  URINALYSIS, ROUTINE W REFLEX MICROSCOPIC - Abnormal; Notable for the following components:   Color, Urine RED (*)    APPearance HAZY (*)    Glucose, UA 50 (*)    Hgb urine dipstick MODERATE (*)    Protein, ur 100 (*)    RBC / HPF >50 (*)    Bacteria, UA RARE (*)  All other components within normal limits  COMPREHENSIVE METABOLIC PANEL - Abnormal; Notable for the following components:   Sodium 126 (*)    Chloride 95 (*)    Glucose, Bld 108 (*)    BUN 41 (*)    Creatinine, Ser 1.50 (*)    Calcium 7.7 (*)    Total Protein 8.3 (*)    Albumin 1.6 (*)    AST 76 (*)    Total Bilirubin 5.1 (*)    GFR, Estimated 55 (*)    All other components within normal limits  AMMONIA - Abnormal; Notable for the following components:   Ammonia 48 (*)    All other components within normal limits  RETICULOCYTES - Abnormal; Notable for the following components:   RBC. 2.56 (*)    Immature Retic Fract 28.0 (*)    All other components within normal limits  RESP PANEL BY RT-PCR (FLU A&B, COVID) ARPGX2  CULTURE, BLOOD (ROUTINE X 2)  CULTURE, BLOOD (ROUTINE X 2)  URINE CULTURE  LACTIC ACID, PLASMA  APTT  RAPID URINE DRUG SCREEN, HOSP PERFORMED  LIPASE, BLOOD  MAGNESIUM  ETHANOL  FOLATE  HEPATITIS PANEL, ACUTE  VITAMIN B12  IRON AND TIBC  FERRITIN  HEPATIC FUNCTION PANEL  HIV ANTIBODY (ROUTINE TESTING W REFLEX)  CBC  COMPREHENSIVE METABOLIC PANEL  TYPE AND SCREEN  PREPARE RBC (CROSSMATCH)  PREPARE FRESH FROZEN PLASMA  ABO/RH    EKG EKG Interpretation  Date/Time:  Friday 2021/03/25 16:51:26 EDT Ventricular Rate:  113 PR Interval:  152 QRS Duration: 90 QT Interval:  368 QTC Calculation: 505 R Axis:   57 Text Interpretation: Sinus tachycardia Left ventricular hypertrophy Prolonged QT interval No old tracing  to compare Confirmed by Mancel Bale [phone]) on 03-25-2021 11:34:54 PM   Radiology CT ABDOMEN PELVIS WO CONTRAST  Result Date: 03-25-2021 CLINICAL DATA:  Fever with hematuria EXAM: CT ABDOMEN AND PELVIS WITHOUT CONTRAST TECHNIQUE: Multidetector CT imaging of the abdomen and pelvis was performed following the standard protocol without oral or IV contrast. COMPARISON:  None. FINDINGS: Lower chest: There are small pleural effusions bilaterally. There is scarring with lower lobe bronchiectatic change in the base regions. There are scattered foci of coronary artery calcification. Hepatobiliary: The liver contour is nodular, likely due to a degree of underlying hepatic cirrhosis. No focal liver lesions are appreciable on this noncontrast enhanced study. Gallbladder wall is not appreciably thickened. There is no biliary duct dilatation. Pancreas: There is no appreciable pancreatic mass or inflammatory focus. Spleen: No splenic lesions are evident. There are apparent varices in the splenic region. Adrenals/Urinary Tract: Adrenals bilaterally appear normal. The right kidney appears edematous. There is mild soft tissue stranding in the perinephric fascia on the right. There is an area of increased attenuation in the upper pole the right kidney measuring 5.5 x 5.3 x 5.2 cm. This lesion has an appearance suggesting focal hemorrhage within this area. This lesion may connect with the upper pole collecting system. There is no hydronephrosis on the right. There is no demonstrable mass in the left kidney on this noncontrast enhanced study. No hydronephrosis. There is a 3 x 3 mm calculus in the lower pole the right kidney. There is no appreciable ureteral calculus on either side. Urinary bladder is midline with wall thickness within normal limits. Stomach/Bowel: There is no appreciable bowel wall or mesenteric thickening. No appreciable bowel obstruction. Terminal ileum appears normal. Appendix absent. No free air or portal  venous air. Vascular/Lymphatic: No abdominal aortic aneurysm. Foci  of calcification noted in each common iliac artery. No adenopathy is evident in the abdomen or pelvis. Subcentimeter inguinal lymph nodes are considered nonspecific. Reproductive: Occasional prostatic calculi noted. Prostate and seminal vesicles normal in size and contour. Other: There is a focal umbilical region hernia containing fat and mild panniculitis. This hernia at its neck measures 1.4 cm from right to left dimension and 1.5 cm from superior to inferior dimension. No bowel containing hernia is evident. There is no abscess or ascites in the abdomen or pelvis. Musculoskeletal: No blastic or lytic bone lesions. No intramuscular lesions. IMPRESSION: 1. Area of increased attenuation in the upper pole of the right kidney measuring 5.5 x 5.3 x 5.2 cm which has an appearance concerning for localized hemorrhage. Etiology for focal hemorrhage in this area is uncertain. Hemorrhage within a mass or vascular malformation could present in this manner. This finding may warrant CT angiogram for further assessment and characterization. This lesion abuts and may invade a portion of the upper pole collecting system which could account for hematuria. Note that the right kidney appears edematous with mild stranding in the right perinephric fascia. 2. Nonobstructing calculus in the lower pole right kidney measuring 3 x 3 mm. 3. 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 No focal liver lesions evident on noncontrast enhanced study. 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 Spleen is not enlarged. 4. Umbilical hernia containing fat and mild panniculitis. No bowel containing hernia evident. 5. No evident bowel obstruction. No abscess in the abdomen or pelvis. Appendix absent. 6. Small pleural effusions bilaterally. Areas of scarring and bronchiectasis in the lung bases. 7.  Foci of iliac artery and coronary artery  calcification. Electronically Signed   By: Bretta Bang III M.D.   On: Apr 03, 2021 20:08   DG Chest Port 1 View  Result Date: 03-Apr-2021 CLINICAL DATA:  Weakness EXAM: PORTABLE CHEST 1 VIEW COMPARISON:  None. FINDINGS: There is mild fullness of the AP window, possibly due to technique. Heart is normal in size. No pleural effusion. No pneumothorax. No acute pleuroparenchymal abnormality. Visualized abdomen is unremarkable. No acute osseous abnormality noted. IMPRESSION: 1. Mild fullness of the AP window, likely due to technique. Consider repeat PA and lateral chest radiograph for improved evaluation. 2. Otherwise no acute cardiopulmonary abnormality. Electronically Signed   By: Meda Klinefelter MD   On: 2021-04-03 16:29    Procedures .Critical Care E&M Performed by: Anselm Pancoast, PA-C  Critical care provider statement:    Critical care time (minutes):  60   Critical care time was exclusive of:  Separately billable procedures and treating other patients   Critical care was necessary to treat or prevent imminent or life-threatening deterioration of the following conditions:  Hepatic failure and sepsis   Critical care was time spent personally by me on the following activities:  Ordering and performing treatments and interventions, ordering and review of radiographic studies, ordering and review of laboratory studies, pulse oximetry, re-evaluation of patient's condition, review of old charts, development of treatment plan with patient or surrogate, discussions with consultants, evaluation of patient's response to treatment, examination of patient and obtaining history from patient or surrogate   I assumed direction of critical care for this patient from another provider in my specialty: no     Care discussed with: admitting provider   After initial E/M assessment, critical care services were subsequently performed that were exclusive of separately billable procedures or treatment.  Medications Ordered in ED Medications  lactated ringers infusion ( Intravenous Rate/Dose Change 03/12/2021 2222)  cefTRIAXone (ROCEPHIN) 1 g in sodium chloride 0.9 % 100 mL IVPB (0 g Intravenous Stopped 03/12/2021 1812)  0.9 %  sodium chloride infusion (Manually program via Guardrails IV Fluids) (has no administration in time range)  ondansetron (ZOFRAN) tablet 4 mg (has no administration in time range)    Or  ondansetron (ZOFRAN) injection 4 mg (has no administration in time range)  fentaNYL (SUBLIMAZE) injection 25-50 mcg (has no administration in time range)  lactated ringers bolus 1,000 mL (0 mLs Intravenous Stopped 02/21/2021 1812)  acetaminophen (TYLENOL) tablet 650 mg (650 mg Oral Given 02/28/2021 1807)  lactated ringers bolus 1,000 mL (0 mLs Intravenous Stopped 03/12/2021 1910)  fentaNYL (SUBLIMAZE) injection 50 mcg (50 mcg Intravenous Given 03/20/2021 2125)  phytonadione (VITAMIN K) tablet 10 mg (10 mg Oral Given 03/07/2021 2226)    ED Course  I have reviewed the triage vital signs and the nursing notes.  Pertinent labs & imaging results that were available during my care of the patient were reviewed by me and considered in my medical decision making (see chart for details).  Clinical Course as of 03/02/2021 2355  Fri Mar 04, 2021  1714 Patient reassessed.  No additional complaints.  No increased work of breathing.  No rales on exam.  No orthopnea. [SJ]  2102 Spoke with Dr. Liliane ShiWinter, urology.  Reviewed the patient's presenting complaints, vital signs, lab abnormalities, CT scan.  Specifically, we spoke about the likely area of hemorrhage in the right kidney. He states there is nothing immediately surgical on the CT.  We do not need to contact IR at this time.  Would be valuable to obtain repeat CT abdomen/pelvis with contrast once the GFR has improved to greater than 60. Recommends RBC transfusion. There may also be an element of pyelonephritis based on CT scan appearance.  The already administered 1  g of Rocephin is sufficient at this time. [SJ]  2119 Spoke with Dr[facility] Ewing [facility]od[facility]. Requests we give the patient vitamin K. GI will see patient in the morning. [SJ]  2123 Spoke with Dr. Julian ReilGardner, hospitalist. States they will take care of all of the further transfusion orders. [SJ]    Clinical Course User Index [SJ] Cregg Jutte C, PA-C   MDM Rules/Calculators/A&P                          Patient arrives complaining of hematuria, fatigue, loss of appetite for the last couple weeks. Febrile, ill-appearing, tachycardic, but not toxic appearing, not hypotensive, not hypoxic.  Code sepsis activated.  Suspected source of fever as urinary due to the patient's complaint.   I reviewed the patient's chart for any additional information. I personally reviewed and interpreted the patient's labs and imaging studies. Anemia.  Thrombocytopenia.  No previous CBC with which to compare. Hyperbilirubinemia with total bilirubin of 5.1, last value noted in the system 1.4 two years ago. Hyponatremia. CT with evidence of hepatic cirrhosis, area of right renal hemorrhage, splenic varices.  Patient admitted for further management.  Systemwide critical shortage of IV contrast dye necessitated this patient's CT performed as noncontrast.  Findings and plan of care discussed with attending physician, Mancel BaleElliott Wentz, MD.   Vitals:   03/22/2021 2045 03/07/2021 2100 03/16/2021 2300 03/02/2021 2316  BP: (!) 105/59 (!) 110/58 103/60 105/65  Pulse: (!) 103 (!) 104 (!) 104 (!) 105  Resp: (!) 30 (!) 31 (!)  30 19  Temp:   98.2 F (36.8 C) 98 F (36.7 C)  TempSrc:   Oral Oral  SpO2: 93% 92% 90% 96%  Weight:      Height:         Final Clinical Impression(s) / ED Diagnoses Final diagnoses:  Febrile illness  Anemia, unspecified type  Thrombocytopenia (HCC)  Hyponatremia  Hyperbilirubinemia  Alcoholic cirrhosis of liver without ascites [facility])    Rx / DC Orders ED Discharge Orders    None       Concepcion Living 03/22/2021 2355    Mancel Bale, MD 03/07/21 1555

## 2021-03-04 NOTE — Progress Notes (Signed)
elink monitoring for sepsis protocol 

## 2021-03-05 ENCOUNTER — Inpatient Hospital Stay (HOSPITAL_COMMUNITY): Payer: 59

## 2021-03-05 ENCOUNTER — Other Ambulatory Visit: Payer: Self-pay

## 2021-03-05 ENCOUNTER — Encounter (HOSPITAL_COMMUNITY): Payer: Self-pay | Admitting: Internal Medicine

## 2021-03-05 DIAGNOSIS — R7881 Bacteremia: Secondary | ICD-10-CM

## 2021-03-05 DIAGNOSIS — A419 Sepsis, unspecified organism: Secondary | ICD-10-CM | POA: Diagnosis not present

## 2021-03-05 DIAGNOSIS — K7460 Unspecified cirrhosis of liver: Secondary | ICD-10-CM

## 2021-03-05 DIAGNOSIS — R6510 Systemic inflammatory response syndrome (SIRS) of non-infectious origin without acute organ dysfunction: Secondary | ICD-10-CM

## 2021-03-05 DIAGNOSIS — N2889 Other specified disorders of kidney and ureter: Secondary | ICD-10-CM

## 2021-03-05 DIAGNOSIS — R6520 Severe sepsis without septic shock: Secondary | ICD-10-CM

## 2021-03-05 DIAGNOSIS — K7200 Acute and subacute hepatic failure without coma: Secondary | ICD-10-CM

## 2021-03-05 DIAGNOSIS — D689 Coagulation defect, unspecified: Secondary | ICD-10-CM

## 2021-03-05 DIAGNOSIS — D72825 Bandemia: Secondary | ICD-10-CM

## 2021-03-05 DIAGNOSIS — R6521 Severe sepsis with septic shock: Secondary | ICD-10-CM

## 2021-03-05 DIAGNOSIS — B9561 Methicillin susceptible Staphylococcus aureus infection as the cause of diseases classified elsewhere: Secondary | ICD-10-CM

## 2021-03-05 DIAGNOSIS — N179 Acute kidney failure, unspecified: Secondary | ICD-10-CM | POA: Diagnosis not present

## 2021-03-05 DIAGNOSIS — N172 Acute kidney failure with medullary necrosis: Secondary | ICD-10-CM

## 2021-03-05 DIAGNOSIS — M00032 Staphylococcal arthritis, left wrist: Secondary | ICD-10-CM

## 2021-03-05 DIAGNOSIS — D62 Acute posthemorrhagic anemia: Secondary | ICD-10-CM

## 2021-03-05 DIAGNOSIS — D65 Disseminated intravascular coagulation [defibrination syndrome]: Secondary | ICD-10-CM

## 2021-03-05 DIAGNOSIS — I76 Septic arterial embolism: Secondary | ICD-10-CM

## 2021-03-05 LAB — COMPREHENSIVE METABOLIC PANEL
ALT: 37 U/L (ref 0–44)
AST: 64 U/L — ABNORMAL HIGH (ref 15–41)
Albumin: 1.8 g/dL — ABNORMAL LOW (ref 3.5–5.0)
Alkaline Phosphatase: 85 U/L (ref 38–126)
Anion gap: 8 (ref 5–15)
BUN: 38 mg/dL — ABNORMAL HIGH (ref 6–20)
CO2: 24 mmol/L (ref 22–32)
Calcium: 8.2 mg/dL — ABNORMAL LOW (ref 8.9–10.3)
Chloride: 101 mmol/L (ref 98–111)
Creatinine, Ser: 1.22 mg/dL (ref 0.61–1.24)
GFR, Estimated: >60 mL/min (ref >60)
Glucose, Bld: 101 mg/dL — ABNORMAL HIGH (ref 70–99)
Potassium: 3.8 mmol/L (ref 3.5–5.1)
Sodium: 133 mmol/L — ABNORMAL LOW (ref 135–145)
Total Bilirubin: 6.5 mg/dL — ABNORMAL HIGH (ref 0.3–1.2)
Total Protein: 8 g/dL (ref 6.5–8.1)

## 2021-03-05 LAB — ECHOCARDIOGRAM COMPLETE
Area-P 1/2: 4.21 cm2
Height: 69 in
S' Lateral: 2.8 cm
Weight: 2306.89 oz

## 2021-03-05 LAB — BLOOD CULTURE ID PANEL (REFLEXED) - BCID2

## 2021-03-05 LAB — HEPATITIS PANEL, ACUTE
HCV Ab: NONREACTIVE
Hep A IgM: NONREACTIVE
Hep B C IgM: NONREACTIVE
Hepatitis B Surface Ag: NONREACTIVE

## 2021-03-05 LAB — PROTIME-INR
INR: 1.9 — ABNORMAL HIGH (ref 0.8–1.2)
Prothrombin Time: 21.8 seconds — ABNORMAL HIGH (ref 11.4–15.2)

## 2021-03-05 LAB — CBC
HCT: 25 % — ABNORMAL LOW (ref 39.0–52.0)
Hemoglobin: 8.6 g/dL — ABNORMAL LOW (ref 13.0–17.0)
MCH: 30.9 pg (ref 26.0–34.0)
MCHC: 34.4 g/dL (ref 30.0–36.0)
MCV: 89.9 fL (ref 80.0–100.0)
Platelets: 97 10*3/uL — ABNORMAL LOW (ref 150–400)
RBC: 2.78 MIL/uL — ABNORMAL LOW (ref 4.22–5.81)
RDW: 16.6 % — ABNORMAL HIGH (ref 11.5–15.5)
WBC: 19 10*3/uL — ABNORMAL HIGH (ref 4.0–10.5)
nRBC: 0 % (ref 0.0–0.2)

## 2021-03-05 LAB — HEPATIC FUNCTION PANEL
ALT: 40 U/L (ref 0–44)
AST: 71 U/L — ABNORMAL HIGH (ref 15–41)
Albumin: 1.4 g/dL — ABNORMAL LOW (ref 3.5–5.0)
Alkaline Phosphatase: 76 U/L (ref 38–126)
Bilirubin, Direct: 2.1 mg/dL — ABNORMAL HIGH (ref 0.0–0.2)
Indirect Bilirubin: 2.6 mg/dL — ABNORMAL HIGH (ref 0.3–0.9)
Total Bilirubin: 4.7 mg/dL — ABNORMAL HIGH (ref 0.3–1.2)
Total Protein: 7.3 g/dL (ref 6.5–8.1)

## 2021-03-05 LAB — FERRITIN: Ferritin: 1805 ng/mL — ABNORMAL HIGH (ref 24–336)

## 2021-03-05 LAB — MRSA PCR SCREENING: MRSA by PCR: NEGATIVE

## 2021-03-05 LAB — IRON AND TIBC
Iron: 109 ug/dL (ref 45–182)
Saturation Ratios: 82 % — ABNORMAL HIGH (ref 17.9–39.5)
TIBC: 133 ug/dL — ABNORMAL LOW (ref 250–450)
UIBC: 24 ug/dL

## 2021-03-05 LAB — URIC ACID: Uric Acid, Serum: 5 mg/dL (ref 3.7–8.6)

## 2021-03-05 LAB — VITAMIN B12: Vitamin B-12: 2357 pg/mL — ABNORMAL HIGH (ref 180–914)

## 2021-03-05 MED ORDER — CEFAZOLIN SODIUM-DEXTROSE 2-4 GM/100ML-% IV SOLN
2.0000 g | Freq: Three times a day (TID) | INTRAVENOUS | Status: DC
  Administered 2021-03-05 – 2021-03-10 (×16): 2 g via INTRAVENOUS
  Filled 2021-03-05 (×20): qty 100

## 2021-03-05 MED ORDER — SODIUM CHLORIDE (PF) 0.9 % IJ SOLN
INTRAMUSCULAR | Status: AC
  Filled 2021-03-05: qty 50

## 2021-03-05 MED ORDER — CHLORHEXIDINE GLUCONATE CLOTH 2 % EX PADS
6.0000 | MEDICATED_PAD | Freq: Every day | CUTANEOUS | Status: DC
  Administered 2021-03-06 – 2021-03-10 (×5): 6 via TOPICAL

## 2021-03-05 MED ORDER — CEFAZOLIN SODIUM-DEXTROSE 2-4 GM/100ML-% IV SOLN
2.0000 g | Freq: Three times a day (TID) | INTRAVENOUS | Status: DC
  Filled 2021-03-05: qty 100

## 2021-03-05 MED ORDER — GADOBUTROL 1 MMOL/ML IV SOLN
6.0000 mL | Freq: Once | INTRAVENOUS | Status: AC | PRN
  Administered 2021-03-05: 6 mL via INTRAVENOUS

## 2021-03-05 MED ORDER — DEXTROSE IN LACTATED RINGERS 5 % IV SOLN: INTRAVENOUS | Status: AC

## 2021-03-05 MED ORDER — IOHEXOL 350 MG/ML SOLN
100.0000 mL | Freq: Once | INTRAVENOUS | Status: AC | PRN
  Administered 2021-03-05: 100 mL via INTRAVENOUS

## 2021-03-05 MED ORDER — PHYTONADIONE 5 MG PO TABS
10.0000 mg | ORAL_TABLET | Freq: Once | ORAL | Status: AC
  Administered 2021-03-05: 10 mg via ORAL
  Filled 2021-03-05: qty 2

## 2021-03-05 MED ORDER — ORAL CARE MOUTH RINSE
15.0000 mL | Freq: Two times a day (BID) | OROMUCOSAL | Status: DC
  Administered 2021-03-05: 15 mL via OROMUCOSAL

## 2021-03-05 MED ORDER — NAFCILLIN SODIUM 2 G IJ SOLR
2.0000 g | INTRAMUSCULAR | Status: DC
  Filled 2021-03-05 (×2): qty 2000

## 2021-03-05 NOTE — Consult Note (Signed)
Reason for Consult: Probable cirrhosis Referring Physician: Hospital team  Greg Bean is an 55 y.o. male.  HPI: Patient seen and examined in hospital computer chart reviewed and case discussed last night with the ER physician and he denies ever being told he had a liver problem and his last set of labs 2 years ago were only minimally elevated but alcoholism runs in the family but other than his hematuria he has no other complaints including no specific GI complaints and has not seen any blood in his bowels and is actually been constipated but is tired of questions and just wants to rest History reviewed. No pertinent past medical history.  Past Surgical History:  Procedure Laterality Date  . APPENDECTOMY    . SKIN GRAFT      Family History  Problem Relation Age of Onset  . Cirrhosis Father   . Alcohol abuse Father     Social History:  reports that he has never smoked. He has never used smokeless tobacco. He reports previous alcohol use. He reports that he does not use drugs.  Allergies: No Known Allergies  Medications: I have reviewed the patient's current medications.  Results for orders placed or performed during the hospital encounter of 03/02/2021 (from the past 48 hour(s))  Resp Panel by RT-PCR (Flu A&B, Covid) Nasopharyngeal Swab     Status: None   Collection Time: 02/27/2021  3:28 PM   Specimen: Nasopharyngeal Swab; Nasopharyngeal(NP) swabs in vial transport medium  Result Value Ref Range   SARS Coronavirus 2 by RT PCR NEGATIVE NEGATIVE    Comment: (NOTE) SARS-CoV-2 target nucleic acids are NOT DETECTED.  The SARS-CoV-2 RNA is generally detectable in upper respiratory specimens during the acute phase of infection. The lowest concentration of SARS-CoV-2 viral copies this assay can detect is 138 copies/mL. A negative result does not preclude SARS-Cov-2 infection and should not be used as the sole basis for treatment or other patient management decisions. A negative result  may occur with  improper specimen collection/handling, submission of specimen other than nasopharyngeal swab, presence of viral mutation(s) within the areas targeted by this assay, and inadequate number of viral copies(<138 copies/mL). A negative result must be combined with clinical observations, patient history, and epidemiological information. The expected result is Negative.  Fact Sheet for Patients:  BloggerCourse.com  Fact Sheet for Healthcare Providers:  SeriousBroker.it  This test is no t yet approved or cleared by the [place] FDA and  has been authorized for detection and/or diagnosis of SARS-CoV-2 by FDA under an Emergency Use Authorization (EUA). This EUA will remain  in effect (meaning this test can be used) for the duration of the COVID-19 declaration under Section 564(b)(1) of the Act, 21 U.S.C.section 360bbb-3(b)(1), unless the authorization is terminated  or revoked sooner.       Influenza A by PCR NEGATIVE NEGATIVE   Influenza B by PCR NEGATIVE NEGATIVE    Comment: (NOTE) The Xpert Xpress SARS-CoV-2/FLU/RSV plus assay is intended as an aid in the diagnosis of influenza from Nasopharyngeal swab specimens and should not be used as a sole basis for treatment. Nasal washings and aspirates are unacceptable for Xpert Xpress SARS-CoV-2/FLU/RSV testing.  Fact Sheet for Patients: BloggerCourse.com  Fact Sheet for Healthcare Providers: SeriousBroker.it  This test is not yet approved or cleared by the [place] FDA and has been authorized for detection and/or diagnosis of SARS-CoV-2 by FDA under an Emergency Use Authorization (EUA). This EUA will remain in effect (meaning this test can be used)  for the duration of the COVID-19 declaration under Section 564(b)(1) of the Act, 21 U.S.C. section 360bbb-3(b)(1), unless the authorization is terminated  or revoked.  Performed at [facility], [address], [place], [place] [address]   Lactic acid, plasma     Status: Abnormal   Collection Time: 03/18/21  3:28 PM  Result Value Ref Range   Lactic Acid, Venous 2.9 (HH) 0.5 - 1.9 mmol/L    Comment: CRITICAL RESULT CALLED TO, READ BACK BY AND VERIFIED WITH: ARIEL, RN AT 1652 ON Mar 18, 2021 BY N.THOMPSON Performed at [facility], [address] [place], [place] [address]   Urinalysis, Routine w reflex microscopic Urine, Clean Catch     Status: Abnormal   Collection Time: 2021/03/18  3:28 PM  Result Value Ref Range   Color, Urine RED (A) YELLOW   APPearance HAZY (A) CLEAR   Specific Gravity, Urine 1.011 1.005 - 1.030   pH 6.0 5.0 - 8.0   Glucose, UA 50 (A) NEGATIVE mg/dL   Hgb urine dipstick MODERATE (A) NEGATIVE   Bilirubin Urine NEGATIVE NEGATIVE   Ketones, ur NEGATIVE NEGATIVE mg/dL   Protein, ur 098 (A) NEGATIVE mg/dL   Nitrite NEGATIVE NEGATIVE   Leukocytes,Ua NEGATIVE NEGATIVE   RBC / HPF >50 (H) 0 - 5 RBC/hpf   Bacteria, UA RARE (A) NONE SEEN    Comment: Performed at [facility], [address], [place], [place] [address]  Urine rapid drug screen (hosp performed)     Status: None   Collection Time: 2021/03/18  3:28 PM  Result Value Ref Range   Opiates NONE DETECTED NONE DETECTED   Cocaine NONE DETECTED NONE DETECTED   Benzodiazepines NONE DETECTED NONE DETECTED   Amphetamines NONE DETECTED NONE DETECTED   Tetrahydrocannabinol NONE DETECTED NONE DETECTED   Barbiturates NONE DETECTED NONE DETECTED    Comment: (NOTE) DRUG SCREEN FOR MEDICAL PURPOSES ONLY.  IF CONFIRMATION IS NEEDED FOR ANY PURPOSE, NOTIFY LAB WITHIN 5 DAYS.  LOWEST DETECTABLE LIMITS FOR URINE DRUG SCREEN Drug Class                     Cutoff (ng/mL) Amphetamine and metabolites    1000 Barbiturate and metabolites    200 Benzodiazepine                 200 Tricyclics and metabolites     300 Opiates  and metabolites        300 Cocaine and metabolites        300 THC                            50 Performed at [facility], [address], [place], [place] [address]   CBC WITH DIFFERENTIAL     Status: Abnormal   Collection Time: 03-18-21  3:30 PM  Result Value Ref Range   WBC 18.9 (H) 4.0 - 10.5 K/uL   RBC 2.55 (L) 4.22 - 5.81 MIL/uL   Hemoglobin 7.8 (L) 13.0 - 17.0 g/dL   HCT 62.1 (L) 30.8 - 65.7 %   MCV 91.0 80.0 - 100.0 fL   MCH 30.6 26.0 - 34.0 pg   MCHC 33.6 30.0 - 36.0 g/dL   RDW 84.6 (H) 96.2 - 95.2 %   Platelets 62 (L) 150 - 400 K/uL    Comment: Immature Platelet Fraction may be clinically indicated, consider ordering this additional test [id number]  nRBC 0.2 0.0 - 0.2 %   Neutrophils Relative % 86 %   Neutro Abs 16.2 (H) 1.7 - 7.7 K/uL   Lymphocytes Relative 9 %   Lymphs Abs 1.8 0.7 - 4.0 K/uL   Monocytes Relative 3 %   Monocytes Absolute 0.5 0.1 - 1.0 K/uL   Eosinophils Relative 0 %   Eosinophils Absolute 0.1 0.0 - 0.5 K/uL   Basophils Relative 0 %   Basophils Absolute 0.1 0.0 - 0.1 K/uL   Immature Granulocytes 2 %   Abs Immature Granulocytes 0.30 (H) 0.00 - 0.07 K/uL    Comment: Performed at [facility], [address], [place], [place] [address]  Protime-INR     Status: Abnormal   Collection Time: 2021/03/19  3:30 PM  Result Value Ref Range   Prothrombin Time 28.7 (H) 11.4 - 15.2 seconds   INR 2.7 (H) 0.8 - 1.2    Comment: (NOTE) INR goal varies based on device and disease states. Performed at [facility], [address] [place], [place] [address]   APTT     Status: None   Collection Time: 19-Mar-2021  3:30 PM  Result Value Ref Range   aPTT 35 24 - 36 seconds    Comment: Performed at [facility], [address], [place], [place] [address]  Blood Culture (routine x 2)     Status: None (Preliminary result)   Collection Time: 03/19/2021  3:30 PM   Specimen: BLOOD  Result Value Ref  Range   Specimen Description      BLOOD LEFT ARM Performed at [facility], [address] [place], [place] [address]    Special Requests      BOTTLES DRAWN AEROBIC AND ANAEROBIC Blood Culture results may not be optimal due to an inadequate volume of blood received in culture bottles Performed at [facility], [address] [place], [place] [address]    Culture  Setup Time      GRAM POSITIVE COCCI IN CLUSTERS IN BOTH AEROBIC AND ANAEROBIC BOTTLES CRITICAL RESULT CALLED TO, READ BACK BY AND VERIFIED WITH: SCOTT CHRISTY PHARMD  03/05/21 EB Performed at [facility], [address], [place], [place] [address]    Culture GRAM POSITIVE COCCI    Report Status PENDING   Reticulocytes     Status: Abnormal   Collection Time: 2021-03-19  3:30 PM  Result Value Ref Range   Retic Ct Pct 2.5 0.4 - 3.1 %   RBC. 2.56 (L) 4.22 - 5.81 MIL/uL   Retic Count, Absolute 64.0 19.0 - 186.0 K/uL   Immature Retic Fract 28.0 (H) 2.3 - 15.9 %    Comment: Performed at [facility], [address] [place], [place] [address]  Blood Culture ID Panel (Reflexed)     Status: Abnormal   Collection Time: Mar 19, 2021  3:30 PM  Result Value Ref Range   Enterococcus faecalis NOT DETECTED NOT DETECTED   Enterococcus Faecium NOT DETECTED NOT DETECTED   Listeria monocytogenes NOT DETECTED NOT DETECTED   Staphylococcus species DETECTED (A) NOT DETECTED    Comment: CRITICAL RESULT CALLED TO, READ BACK BY AND VERIFIED WITH: SCOTT CHRISTY PHARMD  03/05/21 EB    Staphylococcus aureus (BCID) DETECTED (A) NOT DETECTED    Comment: CRITICAL RESULT CALLED TO, READ BACK BY AND VERIFIED WITH: SCOTT CHRISTY PHARMD  03/05/21 EB    Staphylococcus epidermidis NOT DETECTED NOT DETECTED   Staphylococcus lugdunensis NOT DETECTED NOT  DETECTED   Streptococcus species NOT DETECTED NOT DETECTED   Streptococcus agalactiae NOT DETECTED NOT DETECTED   Streptococcus  pneumoniae NOT DETECTED NOT DETECTED   Streptococcus pyogenes NOT DETECTED NOT DETECTED   A.calcoaceticus-baumannii NOT DETECTED NOT DETECTED   Bacteroides fragilis NOT DETECTED NOT DETECTED   Enterobacterales NOT DETECTED NOT DETECTED   Enterobacter cloacae complex NOT DETECTED NOT DETECTED   Escherichia coli NOT DETECTED NOT DETECTED   Klebsiella aerogenes NOT DETECTED NOT DETECTED   Klebsiella oxytoca NOT DETECTED NOT DETECTED   Klebsiella pneumoniae NOT DETECTED NOT DETECTED   Proteus species NOT DETECTED NOT DETECTED   Salmonella species NOT DETECTED NOT DETECTED   Serratia marcescens NOT DETECTED NOT DETECTED   Haemophilus influenzae NOT DETECTED NOT DETECTED   Neisseria meningitidis NOT DETECTED NOT DETECTED   Pseudomonas aeruginosa NOT DETECTED NOT DETECTED   Stenotrophomonas maltophilia NOT DETECTED NOT DETECTED   Candida albicans NOT DETECTED NOT DETECTED   Candida auris NOT DETECTED NOT DETECTED   Candida glabrata NOT DETECTED NOT DETECTED   Candida krusei NOT DETECTED NOT DETECTED   Candida parapsilosis NOT DETECTED NOT DETECTED   Candida tropicalis NOT DETECTED NOT DETECTED   Cryptococcus neoformans/gattii NOT DETECTED NOT DETECTED   Meth resistant mecA/C and MREJ NOT DETECTED NOT DETECTED    Comment: Performed at [facility], [address] [place], [place] [address]  Comprehensive metabolic panel     Status: Abnormal   Collection Time: 02/28/2021  4:19 PM  Result Value Ref Range   Sodium 126 (L) 135 - 145 mmol/L   Potassium 3.9 3.5 - 5.1 mmol/L   Chloride 95 (L) 98 - 111 mmol/L   CO2 22 22 - 32 mmol/L   Glucose, Bld 108 (H) 70 - 99 mg/dL    Comment: Glucose reference range applies only to samples taken after fasting for at least 8 hours.   BUN 41 (H) 6 - 20 mg/dL   Creatinine, Ser 6.19 (H) 0.61 - 1.24 mg/dL   Calcium 7.7 (L) 8.9 - 10.3 mg/dL   Total Protein 8.3 (H) 6.5 - 8.1 g/dL   Albumin 1.6 (L) 3.5 - 5.0 g/dL   AST 76 (H) 15 - 41 U/L   ALT 41 0 - 44  U/L   Alkaline Phosphatase 81 38 - 126 U/L   Total Bilirubin 5.1 (H) 0.3 - 1.2 mg/dL   GFR, Estimated 55 (L) >60 mL/min    Comment: (NOTE) Calculated using the CKD-EPI Creatinine Equation (2021)    Anion gap 9 5 - 15    Comment: Performed at [facility], [address], [place], [place] [address]  Lipase, blood     Status: None   Collection Time: Mar 08, 2021  4:19 PM  Result Value Ref Range   Lipase 47 11 - 51 U/L    Comment: Performed at [facility], [address] [place], [place] [address]  Magnesium     Status: None   Collection Time: 03/02/2021  4:19 PM  Result Value Ref Range   Magnesium 2.0 1.7 - 2.4 mg/dL    Comment: Performed at [facility], [address] [place], [place] [address]  Hepatitis panel, acute     Status: None   Collection Time: 08-Mar-2021  6:50 PM  Result Value Ref Range   Hepatitis B Surface Ag NON REACTIVE NON REACTIVE   HCV Ab NON REACTIVE NON REACTIVE    Comment: (NOTE) Nonreactive HCV antibody screen is consistent with no  HCV infections,  unless recent infection is suspected or other evidence exists to indicate HCV infection.     Hep A IgM NON REACTIVE NON REACTIVE   Hep B C IgM NON REACTIVE NON REACTIVE    Comment: Performed at [facility], [address], [place], [place] [address]  Type and screen [facility]     Status: None (Preliminary result)   Collection Time: 03/09/2021  6:58 PM  Result Value Ref Range   ABO/RH(D) B POS    Antibody Screen NEG    Sample Expiration 03/07/2021,2359    Unit Number [id number]    Blood Component Type RED CELLS,LR    Unit division 00    Status of Unit ISSUED    Transfusion Status OK TO TRANSFUSE    Crossmatch Result      Compatible Performed at [facility], [address] [place], [place] [address]   Folate     Status: None   Collection Time: 03/06/2021  7:02 PM  Result Value Ref Range   Folate 11.0 >5.9  ng/mL    Comment: Performed at [facility], [address], [place], [place] [address]  ABO/Rh     Status: None   Collection Time: 03/05/2021  7:20 PM  Result Value Ref Range   ABO/RH(D)      B POS Performed at [facility], [address], [place], [place] [address]   Prepare RBC (crossmatch)     Status: None   Collection Time: 03/18/2021  9:25 PM  Result Value Ref Range   Order Confirmation      ORDER PROCESSED BY BLOOD BANK Performed at [facility], [address] [place], [place] [address]   Prepare fresh frozen plasma     Status: None (Preliminary result)   Collection Time: 03/21/2021  9:25 PM  Result Value Ref Range   Unit Number [id number]    Blood Component Type THW PLS APHR    Unit division B0    Status of Unit ISSUED    Transfusion Status OK TO TRANSFUSE    Unit Number [id number]    Blood Component Type THAWED PLASMA    Unit division 00    Status of Unit ISSUED    Transfusion Status      OK TO TRANSFUSE Performed at [facility], [address], [place], [place] [address]    Unit Number [id number]    Blood Component Type THW PLS APHR    Unit division 00    Status of Unit ISSUED    Transfusion Status OK TO TRANSFUSE   Lactic acid, plasma     Status: None   Collection Time: 03/14/2021  9:27 PM  Result Value Ref Range   Lactic Acid, Venous 1.9 0.5 - 1.9 mmol/L    Comment: Performed at [facility], [address], [place], [place] [address]  Ammonia     Status: Abnormal   Collection Time: 03/22/2021  9:27 PM  Result Value Ref Range   Ammonia 48 (H) 9 - 35 umol/L    Comment: Performed at [facility], [address], [place], [place] [address]  Ethanol     Status: None   Collection Time: 03/15/2021  9:27 PM  Result Value Ref Range   Alcohol, Ethyl (B) <10 <10 mg/dL    Comment: (NOTE) Lowest detectable limit for serum alcohol is 10 mg/dL.  For  medical purposes only. Performed at  [facility], [address] [place], [place] [address]   Hepatic function panel     Status: Abnormal   Collection Time: 02/22/2021  9:27 PM  Result Value Ref Range   Total Protein 7.3 6.5 - 8.1 g/dL   Albumin 1.4 (L) 3.5 - 5.0 g/dL   AST 71 (H) 15 - 41 U/L   ALT 40 0 - 44 U/L   Alkaline Phosphatase 76 38 - 126 U/L   Total Bilirubin 4.7 (H) 0.3 - 1.2 mg/dL   Bilirubin, Direct 2.1 (H) 0.0 - 0.2 mg/dL   Indirect Bilirubin 2.6 (H) 0.3 - 0.9 mg/dL    Comment: Performed at [facility], [address] [place], [place] [address]  Protime-INR     Status: Abnormal   Collection Time: 03/05/21  8:24 AM  Result Value Ref Range   Prothrombin Time 21.8 (H) 11.4 - 15.2 seconds   INR 1.9 (H) 0.8 - 1.2    Comment: (NOTE) INR goal varies based on device and disease states. Performed at [facility], [address], [place], [place] [address]   CBC     Status: Abnormal   Collection Time: 03/05/21  8:24 AM  Result Value Ref Range   WBC 19.0 (H) 4.0 - 10.5 K/uL   RBC 2.78 (L) 4.22 - 5.81 MIL/uL   Hemoglobin 8.6 (L) 13.0 - 17.0 g/dL   HCT 91.4 (L) 78.2 - 95.6 %   MCV 89.9 80.0 - 100.0 fL   MCH 30.9 26.0 - 34.0 pg   MCHC 34.4 30.0 - 36.0 g/dL   RDW 21.3 (H) 08.6 - 57.8 %   Platelets 97 (L) 150 - 400 K/uL    Comment: Immature Platelet Fraction may be clinically indicated, consider ordering this additional test [id number]    nRBC 0.0 0.0 - 0.2 %    Comment: Performed at [facility], [address], [place], [place] [address]  Comprehensive metabolic panel     Status: Abnormal   Collection Time: 03/05/21  8:24 AM  Result Value Ref Range   Sodium 133 (L) 135 - 145 mmol/L    Comment: DELTA CHECK NOTED   Potassium 3.8 3.5 - 5.1 mmol/L   Chloride 101 98 - 111 mmol/L   CO2 24 22 - 32 mmol/L   Glucose, Bld 101 (H) 70 - 99 mg/dL    Comment: Glucose reference range applies only to  samples taken after fasting for at least 8 hours.   BUN 38 (H) 6 - 20 mg/dL   Creatinine, Ser 4.40 0.61 - 1.24 mg/dL   Calcium 8.2 (L) 8.9 - 10.3 mg/dL   Total Protein 8.0 6.5 - 8.1 g/dL   Albumin 1.8 (L) 3.5 - 5.0 g/dL   AST 64 (H) 15 - 41 U/L   ALT 37 0 - 44 U/L   Alkaline Phosphatase 85 38 - 126 U/L   Total Bilirubin 6.5 (H) 0.3 - 1.2 mg/dL   GFR, Estimated >10 >27 mL/min    Comment: (NOTE) Calculated using the CKD-EPI Creatinine Equation (2021)    Anion gap 8 5 - 15    Comment: Performed at [facility], [address], [place], [place] [address]    CT ABDOMEN PELVIS WO CONTRAST  Result Date: 03/01/2021 CLINICAL DATA:  Fever with hematuria EXAM: CT ABDOMEN AND PELVIS WITHOUT CONTRAST TECHNIQUE: Multidetector CT imaging of the abdomen and pelvis was performed following the standard protocol without oral or IV contrast. COMPARISON:  None. FINDINGS: Lower chest: There are small pleural effusions bilaterally. There is scarring with lower lobe bronchiectatic change in the base regions. There are scattered foci of coronary artery calcification. Hepatobiliary: The liver contour is nodular, likely due to a degree of underlying hepatic cirrhosis. No focal liver lesions are appreciable on this noncontrast enhanced study. Gallbladder wall is not appreciably thickened. There is no biliary duct dilatation. Pancreas: There is no appreciable pancreatic mass or inflammatory focus. Spleen: No splenic lesions are evident. There are apparent varices in the splenic region. Adrenals/Urinary Tract: Adrenals bilaterally appear normal. The right kidney appears edematous. There is mild soft tissue stranding in the perinephric fascia on the right. There is an area of increased attenuation in the upper pole the right kidney measuring 5.5 x 5.3 x 5.2 cm. This lesion has an appearance suggesting focal hemorrhage within this area. This lesion may connect with the upper pole collecting system. There is  no hydronephrosis on the right. There is no demonstrable mass in the left kidney on this noncontrast enhanced study. No hydronephrosis. There is a 3 x 3 mm calculus in the lower pole the right kidney. There is no appreciable ureteral calculus on either side. Urinary bladder is midline with wall thickness within normal limits. Stomach/Bowel: There is no appreciable bowel wall or mesenteric thickening. No appreciable bowel obstruction. Terminal ileum appears normal. Appendix absent. No free air or portal venous air. Vascular/Lymphatic: No abdominal aortic aneurysm. Foci of calcification noted in each common iliac artery. No adenopathy is evident in the abdomen or pelvis. Subcentimeter inguinal lymph nodes are considered nonspecific. Reproductive: Occasional prostatic calculi noted. Prostate and seminal vesicles normal in size and contour. Other: There is a focal umbilical region hernia containing fat and mild panniculitis. This hernia at its neck measures 1.4 cm from right to left dimension and 1.5 cm from superior to inferior dimension. No bowel containing hernia is evident. There is no abscess or ascites in the abdomen or pelvis. Musculoskeletal: No blastic or lytic bone lesions. No intramuscular lesions. IMPRESSION: 1. Area of increased attenuation in the upper pole of the right kidney measuring 5.5 x 5.3 x 5.2 cm which has an appearance concerning for localized hemorrhage. Etiology for focal hemorrhage in this area is uncertain. Hemorrhage within a mass or vascular malformation could present in this manner. This finding may warrant CT angiogram for further assessment and characterization. This lesion abuts and may invade a portion of the upper pole collecting system which could account for hematuria. Note that the right kidney appears edematous with mild stranding in the right perinephric fascia. 2. Nonobstructing calculus in the lower pole right kidney measuring 3 x 3 mm. 3. Liver contour is nodular, likely  indicative of a degree of hepatic cirrhosis. No focal liver lesions evident on noncontrast enhanced study. 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 Spleen is not enlarged. 4. Umbilical hernia containing fat and mild panniculitis. No bowel containing hernia evident. 5. No evident bowel obstruction. No abscess in the abdomen or pelvis. Appendix absent. 6. Small pleural effusions bilaterally. Areas of scarring and bronchiectasis in the lung bases. 7.  Foci of iliac artery and coronary artery calcification. Electronically Signed   By: Bretta Bang III M.D.   On: 03/21/2021 20:08   DG Chest Port 1 View  Result Date: 02/25/2021 CLINICAL DATA:  Weakness EXAM: PORTABLE CHEST 1 VIEW COMPARISON:  None. FINDINGS: There is mild fullness of the AP window, possibly due to technique. Heart is normal in size.  No pleural effusion. No pneumothorax. No acute pleuroparenchymal abnormality. Visualized abdomen is unremarkable. No acute osseous abnormality noted. IMPRESSION: 1. Mild fullness of the AP window, likely due to technique. Consider repeat PA and lateral chest radiograph for improved evaluation. 2. Otherwise no acute cardiopulmonary abnormality. Electronically Signed   By: Meda KlinefelterStephanie  Peacock MD   On: 03/19/2021 16:29    Review of Systems negative except above Blood pressure 130/79, pulse (!) 112, temperature 99.2 F (37.3 C), temperature source Axillary, resp. rate (!) 22, height 5\' 9"  (1.753 m), weight 65.4 kg, SpO2 94 %. Physical Exam vital signs stable afebrile no acute distress abdomen is soft nontender no pedal edema CT reviewed probable cirrhosis increased BUN and creatinine bilirubin slightly more indirect minimal elevated other liver test INR increased improved with FFP viral serologies negative white count increased hemoglobin decreased low platelet count  Assessment/Plan: Almost certainly alcoholic cirrhosis Plan: Recommend urologic consultation probably needs inpatient rehab for  alcohol consider drawing other cirrhotic labs to include ferritin and iron studies ceruloplasmin alpha-1 antitrypsin and anti-smooth muscle antibody just to be complete otherwise correct INR with vitamin K and FFP as needed and may slowly advance diet since no signs of bleeding or obvious GI issues currently and please let me know if I can be of any further assistance this weekend  [facility] Medical CenterMAGOD,Giorgio Chabot E 03/05/2021, 9:46 AM

## 2021-03-05 NOTE — Progress Notes (Signed)
Pharmacy Antibiotic Note  Greg Bean is a 55 y.o. male admitted on 03/06/2021 with renal hemorrhage.  Pharmacy has been consulted for cefazolin dosing for MSSA bacteremia.  Plan: Cefazolin 2g IV q8h No dose adjustments needed, Pharmacy will sign off note writing, but will continue to follow renal function peripherally  Height: 5\' 9"  (175.3 cm) Weight: 65.4 kg (144 lb 2.9 oz) IBW/kg (Calculated) : 70.7  Temp (24hrs), Avg:99.1 F (37.3 C), Min:98 F (36.7 C), Max:100.7 F (38.2 C)  Recent Labs  Lab 03/09/2021 1528 03/08/2021 1530 03/09/2021 1619 03/09/2021 2127 03/05/21 0824  WBC  --  18.9*  --   --  19.0*  CREATININE  --   --  1.50*  --  1.22  LATICACIDVEN 2.9*  --   --  1.9  --     Estimated Creatinine Clearance: 63.3 mL/min (by C-G formula based on SCr of 1.22 mg/dL).    No Known Allergies  Antimicrobials this admission:  5/13 CTX x 1 5/14 Ancef >>  Dose adjustments this admission:   Microbiology results:  5/13 BCx: both bottles of 1 set MSSA 5/13 UCx: 5/13 Hepatitis panel: neg 5/14 MRSA PCR: Thank you for allowing pharmacy to be a part of this patient's care.  6/14, PharmD, BCPS Pharmacy: [phone] 03/05/2021 10:12 AM

## 2021-03-05 NOTE — Progress Notes (Addendum)
TTE with PFO but no other significant finding.  MRI advised rest voice infectious myocarditis and 1.6 cm abscess as well as small radiocarpal and distal radial ulnar joint complex effusion concerning for septic arthritis.  Consulted hand surgery, Dr. Izora Ribas. Left message with an operator.  Addendum Cr. Coley called back and recommended IR consult for tapping. He will see patient.

## 2021-03-05 NOTE — Plan of Care (Signed)
  Problem: Education: Goal: Knowledge of General Education information will improve Description: Including pain rating scale, medication(s)/side effects and non-pharmacologic comfort measures Outcome: Progressing   Problem: Health Behavior/Discharge Planning: Goal: Ability to manage health-related needs will improve Outcome: Progressing   Problem: Clinical Measurements: Goal: Ability to maintain clinical measurements within normal limits will improve Outcome: Progressing Goal: Will remain free from infection Outcome: Progressing Goal: Diagnostic test results will improve Outcome: Progressing Goal: Respiratory complications will improve Outcome: Progressing Goal: Cardiovascular complication will be avoided Outcome: Progressing   Problem: Activity: Goal: Risk for activity intolerance will decrease Outcome: Progressing   Problem: Nutrition: Goal: Adequate nutrition will be maintained Outcome: Progressing   Problem: Coping: Goal: Level of anxiety will decrease Outcome: Progressing   Problem: Elimination: Goal: Will not experience complications related to bowel motility Outcome: Progressing Goal: Will not experience complications related to urinary retention Outcome: Progressing   Problem: Pain Managment: Goal: General experience of comfort will improve Outcome: Progressing   Problem: Safety: Goal: Ability to remain free from injury will improve Outcome: Progressing   Problem: Skin Integrity: Goal: Risk for impaired skin integrity will decrease Outcome: Progressing   Problem: Fluid Volume: Goal: Hemodynamic stability will improve Outcome: Progressing   Problem: Clinical Measurements: Goal: Diagnostic test results will improve Outcome: Progressing Goal: Signs and symptoms of infection will decrease Outcome: Progressing   Problem: Respiratory: Goal: Ability to maintain adequate ventilation will improve Outcome: Progressing   Problem: Education: Goal:  Knowledge of disease and its progression will improve Outcome: Progressing   Problem: Health Behavior/Discharge Planning: Goal: Ability to manage health-related needs will improve Outcome: Progressing   Problem: Clinical Measurements: Goal: Complications related to the disease process or treatment will be avoided or minimized Outcome: Progressing Goal: Dialysis access will remain free of complications Outcome: Progressing   Problem: Activity: Goal: Activity intolerance will improve Outcome: Progressing   Problem: Fluid Volume: Goal: Fluid volume balance will be maintained or improved Outcome: Progressing   Problem: Nutritional: Goal: Ability to make appropriate dietary choices will improve Outcome: Progressing   Problem: Respiratory: Goal: Respiratory symptoms related to disease process will be avoided Outcome: Progressing   Problem: Self-Concept: Goal: Body image disturbance will be avoided or minimized Outcome: Progressing   Problem: Urinary Elimination: Goal: Progression of disease will be identified and treated Outcome: Progressing

## 2021-03-05 NOTE — Progress Notes (Signed)
PROGRESS NOTE  Greg Bean UVO:[id number] DOB: 1965-12-25   PCP: Pcp, No  Patient is from: Home  DOA: 03/02/2021 LOS: 1  Chief complaints: Hematuria, confusion and flank pain  Brief Narrative / Interim history: 55 year old M with no PMH other than previous alcohol abuse and COVID-19 illness presenting with hematuria, flank pain, weight loss and confusion, and found to have SIRS, liver cirrhosis with splenic varices, coagulopathy, hyperbilirubinemia, ABLA and AKI.   Patient received 3 units of FFP, 10 mg of IV vitamin K and 1u pRBCs.  Started on IV ceftriaxone for SIRS, and IVF for AKI.  GI and urology consulted.   The next day, blood culture with MSSA in 3 out of 4 bottles.  ID also consulted.   Subjective: Seen and examined earlier this morning.  No major events overnight or this morning.  Complains left wrist pain that has been going on for about a week.  Also feels thirsty.  He says his urine is clear now.  He denies chest pain, shortness of breath, nausea, vomiting, abdominal pain and back pain but states that his back is tight after he stopped working. He seems uncomfortable in bed.  He says he has not had alcohol since November 2021.  Denies history of hepatitis.  Denies IVDU.  Objective: Vitals:   03/05/21 0625 03/05/21 0700 03/05/21 0800 03/05/21 1000  BP: 120/69 125/67 130/79 133/74  Pulse:  (!) 112 (!) 112 (!) 114  Resp:  (!) 30 (!) 22 (!) 22  Temp: 98.8 F (37.1 C) 98.9 F (37.2 C) 99.2 F (37.3 C)   TempSrc: Oral Oral Axillary   SpO2:  91% 94% 95%  Weight:      Height:        Intake/Output Summary (Last 24 hours) at 03/05/2021 1300 Last data filed at 03/05/2021 1053 Gross per 24 hour  Intake 6029.51 ml  Output 1325 ml  Net 4704.51 ml   Filed Weights   03/11/2021 1451 03/05/21 0040  Weight: 60.3 kg 65.4 kg    Examination:  GENERAL: No apparent distress.  Nontoxic. HEENT: Chapped lips.  Sclera anicteric. NECK: Supple.  No apparent JVD.  RESP:  No IWOB.   Fair aeration bilaterally. CVS: Tachycardic to 110s.  Regular rhythm. Heart sounds normal.  No murmur ABD/GI/GU: BS+. Abd soft, NTND.  MSK/EXT:  Moves extremities. No apparent deformity.  TTP in his left wrist.  Limited ROM. SKIN: no apparent skin lesion or wound NEURO: Awake, alert and oriented appropriately.  No apparent focal neuro deficit. PSYCH: Calm. Normal affect.   Procedures:  None  Microbiology summarized: [id number] and influenza PCR nonreactive. Blood cultures with MSSA.  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  Met criteria with fever, tachycardia, leukocytosis and multiorgan failure including coagulopathy, hyperbilirubinemia, encephalopathy and lactic acidosis.  Blood cultures with MSSA. -ID autoconsulted by the system -Ceftriaxone>> Ancef -Follow MRI left wrist, thoracic spine, TTE, repeat blood cultures  Renal hemorrhage/hematuria: CT abdomen and pelvis w/o contrast showed 5 cm enhancing right renal lesion suggestive for hematoma.  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 AST slightly elevated suggesting recent alcohol consumption although patient denies alcohol in the last 4 to 5 months.  Acute hepatitis panel negative.  Low suspicion for SBP. -Appreciate input by GI -Follow speciality labs -Recheck LFTs and bilirubin in the morning  Elevated LFT/hyperbilirubinemia-bilirubin trended up. Recent Labs  Lab 03/05/2021 1619 03/09/2021 2127 03/05/21 0824  AST 76* 71* 64*  ALT 41 40 37  ALKPHOS 81 76 85  BILITOT 5.1* 4.7* 6.5*  PROT 8.3* 7.3 8.0  ALBUMIN 1.6* 1.4* 1.8*  -Management as above  ABLA due to hematuria.  He could have underlying anemia of chronic disease Recent Labs    02/22/2021 1530 03/05/21 0824  HGB 7.8* 8.6*  -Transfused 1 unit with appropriate  response. -Continue monitoring  Coagulopathy: APTT/PT/INR 35/28.7/2.7.  INR down to 1.9.  Coagulopathy could be due to cirrhosis and sepsis -Received 3 units of FFP and 10 mg vitamin K -We will give p.o. vitamin K x1 today  Hepatic encephalopathy?-Oriented x4 but slow.  Ammonia 48. -Start p.o. lactulose after CT and MRI results.  AKI/azotemia: Resolving. Recent Labs    03/15/2021 1619 03/05/21 0824  BUN 41* 38*  CREATININE 1.50* 1.22  -Continue monitoring  Leukocytosis/bandemia: Likely due to #1. -Continue trending.   Body mass index is 21.29 kg/m.         DVT prophylaxis:  SCDs Start: 03/06/2021 2147  Code Status: Full code Family Communication: Patient and/or RN.  Attempted to call patient's mother for update but no answer. Level of care: Stepdown Status is: Inpatient  Remains inpatient appropriate because:Hemodynamically unstable, Ongoing active pain requiring inpatient pain management, Ongoing diagnostic testing needed not appropriate for outpatient work up, Unsafe d/c plan, IV treatments appropriate due to intensity of illness or inability to take PO and Inpatient level of care appropriate due to severity of illness   Dispo: The patient is from: Home              Anticipated d/c is to: Home              Patient currently is not medically stable to d/c.   Difficult to place patient No       Consultants:  Infectious disease Urology Gastroenterology   Sch Meds:  Scheduled Meds: . Chlorhexidine Gluconate Cloth  6 each Topical Daily  . mouth rinse  15 mL Mouth Rinse BID  . sodium chloride (PF)       Continuous Infusions: .  ceFAZolin (ANCEF) IV     PRN Meds:.fentaNYL (SUBLIMAZE) injection, iohexol, ondansetron **OR** ondansetron (ZOFRAN) IV  Antimicrobials: Anti-infectives (From admission, onward)   Start     Dose/Rate Route Frequency Ordered Stop   03/05/21 1030  nafcillin injection 2 g  Status:  Discontinued        2 g Intravenous Every 4 hours  03/05/21 0917 03/05/21 0937   03/05/21 1030  ceFAZolin (ANCEF) IVPB 2g/100 mL premix  Status:  Discontinued        2 g 200 mL/hr over 30 Minutes Intravenous Every 8 hours 03/05/21 0938 03/05/21 0941   03/05/21 1030  ceFAZolin (ANCEF) IVPB 2g/100 mL premix        2 g 200 mL/hr over 30 Minutes Intravenous Every 8 hours 03/05/21 0950     03/03/2021 1530  cefTRIAXone (ROCEPHIN) 1 g in sodium chloride 0.9 % 100 mL IVPB  Status:  Discontinued        1 g 200 mL/hr over 30 Minutes Intravenous Every 24 hours 03/11/2021 1529 03/05/21 0915       I have personally reviewed the following labs and images: CBC: Recent Labs  Lab 02/26/2021 1530 03/05/21 0824  WBC 18.9* 19.0*  NEUTROABS 16.2*  --   HGB 7.8* 8.6*  HCT 23.2* 25.0*  MCV 91.0 89.9  PLT 62* 97*  BMP &GFR Recent Labs  Lab 03/14/2021 1619 03/05/21 0824  NA 126* 133*  K 3.9 3.8  CL 95* 101  CO2 22 24  GLUCOSE 108* 101*  BUN 41* 38*  CREATININE 1.50* 1.22  CALCIUM 7.7* 8.2*  MG 2.0  --    Estimated Creatinine Clearance: 63.3 mL/min (by C-G formula based on SCr of 1.22 mg/dL). Liver & Pancreas: Recent Labs  Lab 03/18/2021 1619 03/21/2021 2127 03/05/21 0824  AST 76* 71* 64*  ALT 41 40 37  ALKPHOS 81 76 85  BILITOT 5.1* 4.7* 6.5*  PROT 8.3* 7.3 8.0  ALBUMIN 1.6* 1.4* 1.8*   Recent Labs  Lab 03/09/2021 1619  LIPASE 47   Recent Labs  Lab 03/01/2021 2127  AMMONIA 48*   Diabetic: No results for input(s): HGBA1C in the last 72 hours. No results for input(s): GLUCAP in the last 168 hours. Cardiac Enzymes: No results for input(s): CKTOTAL, CKMB, CKMBINDEX, TROPONINI in the last 168 hours. No results for input(s): PROBNP in the last 8760 hours. Coagulation Profile: Recent Labs  Lab 03/01/2021 1530 03/05/21 0824  INR 2.7* 1.9*   Thyroid Function Tests: No results for input(s): TSH, T4TOTAL, FREET4, T3FREE, THYROIDAB in the last 72 hours. Lipid Profile: No results for input(s): CHOL, HDL, LDLCALC, TRIG, CHOLHDL, LDLDIRECT  in the last 72 hours. Anemia Panel: Recent Labs    03/07/2021 1530 03/09/2021 1902 03/05/21 0824  VITAMINB12  --   --  2,357*  FOLATE  --  11.0  --   FERRITIN  --   --  1,805*  TIBC  --   --  133*  IRON  --   --  109  RETICCTPCT 2.5  --   --    Urine analysis:    Component Value Date/Time   COLORURINE RED (A) 03/20/2021 1528   APPEARANCEUR HAZY (A) 02/21/2021 1528   LABSPEC 1.011 03/09/2021 1528   PHURINE 6.0 02/25/2021 1528   GLUCOSEU 50 (A) 03/18/2021 1528   HGBUR MODERATE (A) 03/19/2021 1528   BILIRUBINUR NEGATIVE 02/22/2021 [place] 02/25/2021 1528   PROTEINUR 100 (A) 03/16/2021 1528   NITRITE NEGATIVE 03/20/2021 [place] 03/03/2021 1528   Sepsis Labs: Invalid input(s): PROCALCITONIN, [place]  Microbiology: Recent Results (from the past 240 hour(s))  Resp Panel by RT-PCR (Flu A&B, Covid) Nasopharyngeal Swab     Status: None   Collection Time: 02/28/2021  3:28 PM   Specimen: Nasopharyngeal Swab; Nasopharyngeal(NP) swabs in vial transport medium  Result Value Ref Range Status   SARS Coronavirus 2 by RT PCR NEGATIVE NEGATIVE Final    Comment: (NOTE) SARS-CoV-2 target nucleic acids are NOT DETECTED.  The SARS-CoV-2 RNA is generally detectable in upper respiratory specimens during the acute phase of infection. The lowest concentration of SARS-CoV-2 viral copies this assay can detect is 138 copies/mL. A negative result does not preclude SARS-Cov-2 infection and should not be used as the sole basis for treatment or other patient management decisions. A negative result may occur with  improper specimen collection/handling, submission of specimen other than nasopharyngeal swab, presence of viral mutation(s) within the areas targeted by this assay, and inadequate number of viral copies(<138 copies/mL). A negative result must be combined with clinical observations, patient history, and epidemiological information. The expected result is  Negative.  Fact Sheet for Patients:  EntrepreneurPulse.com.au  Fact Sheet for Healthcare Providers:  IncredibleEmployment.be  This test is no t yet approved or cleared by the [place] FDA and  has  been authorized for detection and/or diagnosis of SARS-CoV-2 by FDA under an Emergency Use Authorization (EUA). This EUA will remain  in effect (meaning this test can be used) for the duration of the COVID-19 declaration under Section 564(b)(1) of the Act, 21 U.S.C.section 360bbb-3(b)(1), unless the authorization is terminated  or revoked sooner.       Influenza A by PCR NEGATIVE NEGATIVE Final   Influenza B by PCR NEGATIVE NEGATIVE Final    Comment: (NOTE) The Xpert Xpress SARS-CoV-2/FLU/RSV plus assay is intended as an aid in the diagnosis of influenza from Nasopharyngeal swab specimens and should not be used as a sole basis for treatment. Nasal washings and aspirates are unacceptable for Xpert Xpress SARS-CoV-2/FLU/RSV testing.  Fact Sheet for Patients: EntrepreneurPulse.com.au  Fact Sheet for Healthcare Providers: IncredibleEmployment.be  This test is not yet approved or cleared by the [place] FDA and has been authorized for detection and/or diagnosis of SARS-CoV-2 by FDA under an Emergency Use Authorization (EUA). This EUA will remain in effect (meaning this test can be used) for the duration of the COVID-19 declaration under Section 564(b)(1) of the Act, 21 U.S.C. section 360bbb-3(b)(1), unless the authorization is terminated or revoked.  Performed at [facility], [place] [address] [place], [place] [address]   Blood Culture (routine x 2)     Status: None (Preliminary result)   Collection Time: 03/03/2021  3:30 PM   Specimen: BLOOD  Result Value Ref Range Status   Specimen Description   Final    BLOOD LEFT ARM Performed at [facility] [address] [place], [place] [address]    Special Requests   Final    BOTTLES DRAWN AEROBIC AND ANAEROBIC Blood Culture results may not be optimal due to an inadequate volume of blood received in culture bottles Performed at [facility] [address], [place], [place] [address]    Culture  Setup Time   Final    GRAM POSITIVE COCCI IN CLUSTERS IN BOTH AEROBIC AND ANAEROBIC BOTTLES CRITICAL RESULT CALLED TO, READ BACK BY AND VERIFIED WITH: SCOTT CHRISTY PHARMD '@0850'  03/05/21 EB Performed at [facility], [place] [address], [place], [place] [address]    Culture GRAM POSITIVE COCCI  Final   Report Status PENDING  Incomplete  Blood Culture ID Panel (Reflexed)     Status: Abnormal   Collection Time: 02/20/2021  3:30 PM  Result Value Ref Range Status   Enterococcus faecalis NOT DETECTED NOT DETECTED Final   Enterococcus Faecium NOT DETECTED NOT DETECTED Final   Listeria monocytogenes NOT DETECTED NOT DETECTED Final   Staphylococcus species DETECTED (A) NOT DETECTED Final    Comment: CRITICAL RESULT CALLED TO, READ BACK BY AND VERIFIED WITH: SCOTT CHRISTY PHARMD '@0850'  03/05/21 EB    Staphylococcus aureus (BCID) DETECTED (A) NOT DETECTED Final    Comment: CRITICAL RESULT CALLED TO, READ BACK BY AND VERIFIED WITH: SCOTT CHRISTY PHARMD '@0850'  03/05/21 EB    Staphylococcus epidermidis NOT DETECTED NOT DETECTED Final   Staphylococcus lugdunensis NOT DETECTED NOT DETECTED Final   Streptococcus species NOT DETECTED NOT DETECTED Final   Streptococcus agalactiae NOT DETECTED NOT DETECTED Final   Streptococcus pneumoniae NOT DETECTED NOT DETECTED Final   Streptococcus pyogenes NOT DETECTED NOT DETECTED Final   A.calcoaceticus-baumannii NOT DETECTED NOT DETECTED Final   Bacteroides fragilis NOT DETECTED NOT DETECTED Final   Enterobacterales NOT DETECTED NOT DETECTED Final   Enterobacter cloacae complex NOT DETECTED NOT DETECTED Final   Escherichia  coli NOT DETECTED NOT DETECTED Final    Klebsiella aerogenes NOT DETECTED NOT DETECTED Final   Klebsiella oxytoca NOT DETECTED NOT DETECTED Final   Klebsiella pneumoniae NOT DETECTED NOT DETECTED Final   Proteus species NOT DETECTED NOT DETECTED Final   Salmonella species NOT DETECTED NOT DETECTED Final   Serratia marcescens NOT DETECTED NOT DETECTED Final   Haemophilus influenzae NOT DETECTED NOT DETECTED Final   Neisseria meningitidis NOT DETECTED NOT DETECTED Final   Pseudomonas aeruginosa NOT DETECTED NOT DETECTED Final   Stenotrophomonas maltophilia NOT DETECTED NOT DETECTED Final   Candida albicans NOT DETECTED NOT DETECTED Final   Candida auris NOT DETECTED NOT DETECTED Final   Candida glabrata NOT DETECTED NOT DETECTED Final   Candida krusei NOT DETECTED NOT DETECTED Final   Candida parapsilosis NOT DETECTED NOT DETECTED Final   Candida tropicalis NOT DETECTED NOT DETECTED Final   Cryptococcus neoformans/gattii NOT DETECTED NOT DETECTED Final   Meth resistant mecA/C and MREJ NOT DETECTED NOT DETECTED Final    Comment: Performed at [facility], [place] [address] [place], [place] [address]  Blood Culture (routine x 2)     Status: None (Preliminary result)   Collection Time: 03/03/2021  3:33 PM   Specimen: BLOOD  Result Value Ref Range Status   Specimen Description   Final    BLOOD RIGHT ANTECUBITAL Performed at [facility] [address], [place], [place] [address]    Special Requests   Final    BOTTLES DRAWN AEROBIC AND ANAEROBIC Blood Culture results may not be optimal due to an inadequate volume of blood received in culture bottles Performed at [facility] [address], [place], [place] [address]    Culture  Setup Time   Final    GRAM POSITIVE COCCI IN CLUSTERS ANAEROBIC BOTTLE ONLY CRITICAL VALUE NOTED.  VALUE IS CONSISTENT WITH PREVIOUSLY REPORTED AND CALLED VALUE. Performed at [facility], [place] [address] [place], [place] [address]    Culture GRAM  POSITIVE COCCI  Final   Report Status PENDING  Incomplete  MRSA PCR Screening     Status: None   Collection Time: 03/05/21  8:19 AM   Specimen: Nasal Mucosa; Nasopharyngeal  Result Value Ref Range Status   MRSA by PCR NEGATIVE NEGATIVE Final    Comment:        The GeneXpert MRSA Assay (FDA approved for NASAL specimens only), is one component of a comprehensive MRSA colonization surveillance program. It is not intended to diagnose MRSA infection nor to guide or monitor treatment for MRSA infections. Performed at [facility], [place] [address] [place], [place] [address]     Radiology Studies: CT ABDOMEN PELVIS WO CONTRAST  Result Date: 03/12/2021 CLINICAL DATA:  Fever with hematuria EXAM: CT ABDOMEN AND PELVIS WITHOUT CONTRAST TECHNIQUE: Multidetector CT imaging of the abdomen and pelvis was performed following the standard protocol without oral or IV contrast. COMPARISON:  None. FINDINGS: Lower chest: There are small pleural effusions bilaterally. There is scarring with lower lobe bronchiectatic change in the base regions. There are scattered foci of coronary artery calcification. Hepatobiliary: The liver contour is nodular, likely due to a degree of underlying hepatic cirrhosis. No focal liver lesions are appreciable on this noncontrast enhanced study. Gallbladder wall is not appreciably thickened. There is no biliary duct dilatation. Pancreas: There is no appreciable pancreatic mass or inflammatory focus. Spleen: No splenic lesions are evident. There are apparent varices in the splenic region. Adrenals/Urinary  Tract: Adrenals bilaterally appear normal. The right kidney appears edematous. There is mild soft tissue stranding in the perinephric fascia on the right. There is an area of increased attenuation in the upper pole the right kidney measuring 5.5 x 5.3 x 5.2 cm. This lesion has an appearance suggesting focal hemorrhage within this area. This lesion may connect with the  upper pole collecting system. There is no hydronephrosis on the right. There is no demonstrable mass in the left kidney on this noncontrast enhanced study. No hydronephrosis. There is a 3 x 3 mm calculus in the lower pole the right kidney. There is no appreciable ureteral calculus on either side. Urinary bladder is midline with wall thickness within normal limits. Stomach/Bowel: There is no appreciable bowel wall or mesenteric thickening. No appreciable bowel obstruction. Terminal ileum appears normal. Appendix absent. No free air or portal venous air. Vascular/Lymphatic: No abdominal aortic aneurysm. Foci of calcification noted in each common iliac artery. No adenopathy is evident in the abdomen or pelvis. Subcentimeter inguinal lymph nodes are considered nonspecific. Reproductive: Occasional prostatic calculi noted. Prostate and seminal vesicles normal in size and contour. Other: There is a focal umbilical region hernia containing fat and mild panniculitis. This hernia at its neck measures 1.4 cm from right to left dimension and 1.5 cm from superior to inferior dimension. No bowel containing hernia is evident. There is no abscess or ascites in the abdomen or pelvis. Musculoskeletal: No blastic or lytic bone lesions. No intramuscular lesions. IMPRESSION: 1. Area of increased attenuation in the upper pole of the right kidney measuring 5.5 x 5.3 x 5.2 cm which has an appearance concerning for localized hemorrhage. Etiology for focal hemorrhage in this area is uncertain. Hemorrhage within a mass or vascular malformation could present in this manner. This finding may warrant CT angiogram for further assessment and characterization. This lesion abuts and may invade a portion of the upper pole collecting system which could account for hematuria. Note that the right kidney appears edematous with mild stranding in the right perinephric fascia. 2. Nonobstructing calculus in the lower pole right kidney measuring 3 x 3 mm. 3.  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 No focal liver lesions evident on noncontrast enhanced study. 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 Spleen is not enlarged. 4. Umbilical hernia containing fat and mild panniculitis. No bowel containing hernia evident. 5. No evident bowel obstruction. No abscess in the abdomen or pelvis. Appendix absent. 6. Small pleural effusions bilaterally. Areas of scarring and bronchiectasis in the lung bases. 7.  Foci of iliac artery and coronary artery calcification. Electronically Signed   By: Lowella Grip III M.D.   On: 03/17/2021 20:08   DG Wrist Complete Left  Result Date: 03/05/2021 CLINICAL DATA:  Left wrist pain for years. Base of first metacarpal phalangeal joint pain. EXAM: LEFT WRIST - COMPLETE 3+ VIEW COMPARISON:  None. FINDINGS: No signs of acute fracture or dislocation. There is narrowing of the radiocarpal joint with degenerative changes at the radiocarpal and distal radioulnar joint. Mild degenerative changes are also noted at the basilar joint. IMPRESSION: 1. 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 2. No acute fracture or dislocation. Electronically Signed   By: Kerby Moors M.D.   On: 03/05/2021 11:18   DG Chest Port 1 View  Result Date: 03/01/2021 CLINICAL DATA:  Weakness EXAM: PORTABLE CHEST 1 VIEW COMPARISON:  None. FINDINGS: There is mild fullness of the  AP window, possibly due to technique. Heart is normal in size. No pleural effusion. No pneumothorax. No acute pleuroparenchymal abnormality. Visualized abdomen is unremarkable. No acute osseous abnormality noted. IMPRESSION: 1. Mild fullness of the AP window, likely due to technique. Consider repeat PA and lateral chest radiograph for improved evaluation. 2. Otherwise no acute cardiopulmonary abnormality. Electronically Signed   By: Valentino Saxon MD   On: 03/02/2021 16:29       Greg Bean [address]  If 7PM-7AM, please contact night-coverage [email or web address] 03/05/2021, 1:00 PM

## 2021-03-05 NOTE — Consult Note (Signed)
Date of Admission:  02/22/2021          Reason for Consult: MSSA bacteremia    Referring Provider: [facility] consult and Dr. Alanda Slim   Assessment:  1. MSSA bacteremia (NOTE NEVER EVER EVER regard MSSA or MRSA as a contaminant no matter how many blood cultures are +--unless seen by [place] and we believe it to be which is nearly never) 2. 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 Including renal hemorrhage that could be an embolization 4. Worsening left wrist pain rule out septic arthritis of left wrist 5. Alcoholic cirrhosis 6. Acute hepatic injury --likely multifactorial in setting of alcoholism and sepsis 7. Pancytopenia 8. Acute renal injury 9. Coagulopathy    Plan:  1. Narrow antibiotics to cefazolin 2. Repeat blood cultures 3. DO NOT PLACE central line until he is cleared his blood cultures 4. 2D echocardiogram 5. MRI of the left wrist 6. TEE when and if it is safe to do so but I would also presumptively treat him for endocarditis and I do not think he would be a surgical candidate should he have a surgical need for treatment of his endocarditis such as continued embolization on effective antibiotic therapy 7. Consider MRI of T-spine with contrast  Principal Problem:   MSSA bacteremia Active Problems:   Renal hemorrhage, right   SIRS (systemic inflammatory response syndrome) (HCC)   Cirrhosis of liver (HCC)   ABLA (acute blood loss anemia)   AKI (acute kidney injury) (HCC)   Scheduled Meds: . Chlorhexidine Gluconate Cloth  6 each Topical Daily  . mouth rinse  15 mL Mouth Rinse BID   Continuous Infusions: .  ceFAZolin (ANCEF) IV     PRN Meds:.fentaNYL (SUBLIMAZE) injection, ondansetron **OR** ondansetron (ZOFRAN) IV  HPI: Greg Bean is a 55 y.o. male past medical history for heavy alcohol abuse, prior COVID infection in January who was developing hematuria long with worsening weight loss flank pain and then confusion.  Currently his wife  and mother pushed him to come to the hospital and he was evaluated in the ER where he was febrile and had leukocytosis of nearly 19,000, pancytopenia acute on acute renal failure, coagulopathy and transaminitis with hyperbilirubinemia he had gross hematuria on his UA.  Blood cultures were taken CT of the abdomen and pelvis was performed which showed in the upper pole of the right kidney 5 x 5 by 5 x 3 x 5.2 cm concerning for hemorrhage.  He also has edema in this kidney with some right perinephric stranding and a nonobstructive calculus in the left pole of the right kidney 3 x 3 cm CT also Verdene Lennert showed evidence of cirrhosis with splenic varices.  Blood cultures have come back with 1 out of 2 sites positive for methicillin sensitive Staph aureus.  On exam he is complaining of back pain though he says he only has the pain when he tries to get out of bed and was not having it much before he came into the hospital it is in the mid spine.  Has had some chronic pain in his left wrist but it is more painful than before and on exam he is quite tender there does not like me manipulating the wrist at all as a look for Janeway lesions splinter hemorrhages or Osler's nodes.  His wrist is also warm to palpation and I suspect he may have a septic wrist.   I spent greater than 80 minutes with the patient including greater than  50% of time in face to face counsel of the patient and personally reviewing his radiographic studies his laboratory data and in coordination of his care.   Review of Systems: Review of Systems  Constitutional: Negative for chills, diaphoresis, fever, malaise/fatigue and weight loss.  HENT: Negative for congestion, hearing loss, sore throat and tinnitus.   Eyes: Negative for blurred vision and double vision.  Respiratory: Negative for cough, sputum production, shortness of breath and wheezing.   Cardiovascular: Negative for chest pain, palpitations and leg swelling.  Gastrointestinal:  Positive for abdominal pain. Negative for blood in stool, constipation, diarrhea, heartburn, melena, nausea and vomiting.  Genitourinary: Negative for dysuria, flank pain and hematuria.  Musculoskeletal: Positive for back pain, joint pain and myalgias. Negative for falls.  Skin: Negative for itching and rash.  Neurological: Negative for dizziness, sensory change, focal weakness, loss of consciousness, weakness and headaches.  Endo/Heme/Allergies: Does not bruise/bleed easily.  Psychiatric/Behavioral: Positive for substance abuse. Negative for depression, memory loss and suicidal ideas. The patient is not nervous/anxious.     History reviewed. No pertinent past medical history.  Social History   Tobacco Use  . Smoking status: Never Smoker  . Smokeless tobacco: Never Used  Substance Use Topics  . Alcohol use: Not Currently    Comment: Daily drinker prior to Nov.  . Drug use: No    Family History  Problem Relation Age of Onset  . Cirrhosis Father   . Alcohol abuse Father    No Known Allergies  OBJECTIVE: Blood pressure 133/74, pulse (!) 114, temperature 99.2 F (37.3 C), temperature source Axillary, resp. rate (!) 22, height 5\' 9"  (1.753 m), weight 65.4 kg, SpO2 95 %.  Physical Exam Constitutional:      General: He is not in acute distress.    Appearance: Normal appearance. He is well-developed. He is not ill-appearing or diaphoretic.  HENT:     Head: Normocephalic and atraumatic.     Right Ear: Hearing and external ear normal.     Left Ear: Hearing and external ear normal.     Nose: No nasal deformity or rhinorrhea.  Eyes:     General: No scleral icterus.    Conjunctiva/sclera: Conjunctivae normal.     Right eye: Right conjunctiva is not injected.     Left eye: Left conjunctiva is not injected.     Pupils: Pupils are equal, round, and reactive to light.  Neck:     Vascular: No JVD.  Cardiovascular:     Rate and Rhythm: Regular rhythm. Tachycardia present.     Heart  sounds: Normal heart sounds, S1 normal and S2 normal. No murmur heard. No friction rub.  Pulmonary:     Effort: Pulmonary effort is normal. No respiratory distress.     Breath sounds: Normal breath sounds. No stridor. No wheezing or rhonchi.  Abdominal:     General: Abdomen is flat. Bowel sounds are normal. There is no distension.     Palpations: Abdomen is soft. There is no mass.     Tenderness: There is no abdominal tenderness.     Hernia: No hernia is present.  Musculoskeletal:     Right shoulder: Normal.     Left shoulder: Tenderness and bony tenderness present. Decreased range of motion.     Cervical back: Normal range of motion and neck supple.     Right hip: Normal.     Left hip: Normal.     Right knee: Normal.     Left knee: Normal.  Lymphadenopathy:     Head:     Right side of head: No submandibular, preauricular or posterior auricular adenopathy.     Left side of head: No submandibular, preauricular or posterior auricular adenopathy.     Cervical: No cervical adenopathy.     Right cervical: No superficial or deep cervical adenopathy.    Left cervical: No superficial or deep cervical adenopathy.  Skin:    General: Skin is warm and dry.     Coloration: Skin is not pale.     Findings: No abrasion, bruising, ecchymosis, erythema, lesion or rash.     Nails: There is no clubbing.  Neurological:     General: No focal deficit present.     Mental Status: He is alert and oriented to person, place, and time.     Sensory: No sensory deficit.     Coordination: Coordination normal.     Gait: Gait normal.  Psychiatric:        Attention and Perception: He is attentive.        Mood and Affect: Mood is depressed.        Speech: Speech is delayed.        Behavior: Behavior is slowed. Behavior is cooperative.        Thought Content: Thought content normal.        Cognition and Memory: Memory is impaired. He exhibits impaired recent memory.     Lab Results Lab Results  Component  Value Date   WBC 19.0 (H) 03/05/2021   HGB 8.6 (L) 03/05/2021   HCT 25.0 (L) 03/05/2021   MCV 89.9 03/05/2021   PLT 97 (L) 03/05/2021    Lab Results  Component Value Date   CREATININE 1.22 03/05/2021   BUN 38 (H) 03/05/2021   NA 133 (L) 03/05/2021   K 3.8 03/05/2021   CL 101 03/05/2021   CO2 24 03/05/2021    Lab Results  Component Value Date   ALT 37 03/05/2021   AST 64 (H) 03/05/2021   ALKPHOS 85 03/05/2021   BILITOT 6.5 (H) 03/05/2021     Microbiology: Recent Results (from the past 240 hour(s))  Resp Panel by RT-PCR (Flu A&B, Covid) Nasopharyngeal Swab     Status: None   Collection Time: 03-13-21  3:28 PM   Specimen: Nasopharyngeal Swab; Nasopharyngeal(NP) swabs in vial transport medium  Result Value Ref Range Status   SARS Coronavirus 2 by RT PCR NEGATIVE NEGATIVE Final    Comment: (NOTE) SARS-CoV-2 target nucleic acids are NOT DETECTED.  The SARS-CoV-2 RNA is generally detectable in upper respiratory specimens during the acute phase of infection. The lowest concentration of SARS-CoV-2 viral copies this assay can detect is 138 copies/mL. A negative result does not preclude SARS-Cov-2 infection and should not be used as the sole basis for treatment or other patient management decisions. A negative result may occur with  improper specimen collection/handling, submission of specimen other than nasopharyngeal swab, presence of viral mutation(s) within the areas targeted by this assay, and inadequate number of viral copies(<138 copies/mL). A negative result must be combined with clinical observations, patient history, and epidemiological information. The expected result is Negative.  Fact Sheet for Patients:  BloggerCourse.com  Fact Sheet for Healthcare Providers:  SeriousBroker.it  This test is no t yet approved or cleared by the [place] FDA and  has been authorized for detection and/or diagnosis of SARS-CoV-2  by FDA under an Emergency Use Authorization (EUA). This EUA will remain  in effect (meaning this test  can be used) for the duration of the COVID-19 declaration under Section 564(b)(1) of the Act, 21 U.S.C.section 360bbb-3(b)(1), unless the authorization is terminated  or revoked sooner.       Influenza A by PCR NEGATIVE NEGATIVE Final   Influenza B by PCR NEGATIVE NEGATIVE Final    Comment: (NOTE) The Xpert Xpress SARS-CoV-2/FLU/RSV plus assay is intended as an aid in the diagnosis of influenza from Nasopharyngeal swab specimens and should not be used as a sole basis for treatment. Nasal washings and aspirates are unacceptable for Xpert Xpress SARS-CoV-2/FLU/RSV testing.  Fact Sheet for Patients: BloggerCourse.comhttps://www.fda.gov/media/152166/download  Fact Sheet for Healthcare Providers: SeriousBroker.ithttps://www.fda.gov/media/152162/download  This test is not yet approved or cleared by the [place] FDA and has been authorized for detection and/or diagnosis of SARS-CoV-2 by FDA under an Emergency Use Authorization (EUA). This EUA will remain in effect (meaning this test can be used) for the duration of the COVID-19 declaration under Section 564(b)(1) of the Act, 21 U.S.C. section 360bbb-3(b)(1), unless the authorization is terminated or revoked.  Performed at [facility], [address] [place], [place] [address]   Blood Culture (routine x 2)     Status: None (Preliminary result)   Collection Time: 06-16-21  3:30 PM   Specimen: BLOOD  Result Value Ref Range Status   Specimen Description   Final    BLOOD LEFT ARM Performed at [facility], [address] [place], [place] [address]    Special Requests   Final    BOTTLES DRAWN AEROBIC AND ANAEROBIC Blood Culture results may not be optimal due to an inadequate volume of blood received in culture bottles Performed at [facility], [address] [place], [place] [address]    Culture   Setup Time   Final    GRAM POSITIVE COCCI IN CLUSTERS IN BOTH AEROBIC AND ANAEROBIC BOTTLES CRITICAL RESULT CALLED TO, READ BACK BY AND VERIFIED WITH: SCOTT CHRISTY PHARMD @0850  03/05/21 EB Performed at [facility], [address] [place], [place] [address]    Culture GRAM POSITIVE COCCI  Final   Report Status PENDING  Incomplete  Blood Culture ID Panel (Reflexed)     Status: Abnormal   Collection Time: 06-16-21  3:30 PM  Result Value Ref Range Status   Enterococcus faecalis NOT DETECTED NOT DETECTED Final   Enterococcus Faecium NOT DETECTED NOT DETECTED Final   Listeria monocytogenes NOT DETECTED NOT DETECTED Final   Staphylococcus species DETECTED (A) NOT DETECTED Final    Comment: CRITICAL RESULT CALLED TO, READ BACK BY AND VERIFIED WITH: SCOTT CHRISTY PHARMD @0850  03/05/21 EB    Staphylococcus aureus (BCID) DETECTED (A) NOT DETECTED Final    Comment: CRITICAL RESULT CALLED TO, READ BACK BY AND VERIFIED WITH: SCOTT CHRISTY PHARMD @0850  03/05/21 EB    Staphylococcus epidermidis NOT DETECTED NOT DETECTED Final   Staphylococcus lugdunensis NOT DETECTED NOT DETECTED Final   Streptococcus species NOT DETECTED NOT DETECTED Final   Streptococcus agalactiae NOT DETECTED NOT DETECTED Final   Streptococcus pneumoniae NOT DETECTED NOT DETECTED Final   Streptococcus pyogenes NOT DETECTED NOT DETECTED Final   A.calcoaceticus-baumannii NOT DETECTED NOT DETECTED Final   Bacteroides fragilis NOT DETECTED NOT DETECTED Final   Enterobacterales NOT DETECTED NOT DETECTED Final   Enterobacter cloacae complex NOT DETECTED NOT DETECTED Final   Escherichia coli NOT DETECTED NOT DETECTED Final   Klebsiella aerogenes NOT DETECTED NOT DETECTED Final   Klebsiella oxytoca NOT DETECTED NOT DETECTED Final  Klebsiella pneumoniae NOT DETECTED NOT DETECTED Final   Proteus species NOT DETECTED NOT DETECTED Final   Salmonella species NOT DETECTED NOT DETECTED Final   Serratia marcescens NOT DETECTED NOT  DETECTED Final   Haemophilus influenzae NOT DETECTED NOT DETECTED Final   Neisseria meningitidis NOT DETECTED NOT DETECTED Final   Pseudomonas aeruginosa NOT DETECTED NOT DETECTED Final   Stenotrophomonas maltophilia NOT DETECTED NOT DETECTED Final   Candida albicans NOT DETECTED NOT DETECTED Final   Candida auris NOT DETECTED NOT DETECTED Final   Candida glabrata NOT DETECTED NOT DETECTED Final   Candida krusei NOT DETECTED NOT DETECTED Final   Candida parapsilosis NOT DETECTED NOT DETECTED Final   Candida tropicalis NOT DETECTED NOT DETECTED Final   Cryptococcus neoformans/gattii NOT DETECTED NOT DETECTED Final   Meth resistant mecA/C and MREJ NOT DETECTED NOT DETECTED Final    Comment: Performed at [facility], [address], [place], [place] [address]  Blood Culture (routine x 2)     Status: None (Preliminary result)   Collection Time: 03/13/2021  3:33 PM   Specimen: BLOOD  Result Value Ref Range Status   Specimen Description   Final    BLOOD RIGHT ANTECUBITAL Performed at [facility], [address], [place], [place] [address]    Special Requests   Final    BOTTLES DRAWN AEROBIC AND ANAEROBIC Blood Culture results may not be optimal due to an inadequate volume of blood received in culture bottles Performed at [facility], [address], [place], [place] [address]    Culture  Setup Time   Final    GRAM POSITIVE COCCI IN CLUSTERS ANAEROBIC BOTTLE ONLY CRITICAL VALUE NOTED.  VALUE IS CONSISTENT WITH PREVIOUSLY REPORTED AND CALLED VALUE. Performed at [facility], [address], [place], [place] [address]    Culture GRAM POSITIVE COCCI  Final   Report Status PENDING  Incomplete  MRSA PCR Screening     Status: None   Collection Time: 03/05/21  8:19 AM   Specimen: Nasal Mucosa; Nasopharyngeal  Result Value Ref Range Status   MRSA by PCR NEGATIVE NEGATIVE Final    Comment:        The GeneXpert MRSA Assay (FDA approved for  NASAL specimens only), is one component of a comprehensive MRSA colonization surveillance program. It is not intended to diagnose MRSA infection nor to guide or monitor treatment for MRSA infections. Performed at [facility], [address], [place], [place] [address]     Acey Lav, MD [facility] for Infectious Disease [facility] [phone] pager  03/05/2021, 11:31 AM

## 2021-03-05 NOTE — Progress Notes (Signed)
PHARMACY - PHYSICIAN COMMUNICATION CRITICAL VALUE ALERT - BLOOD CULTURE IDENTIFICATION (BCID)  Greg Bean is an 55 y.o. male who presented to [facility] on 03-13-21 with a chief complaint of hematuria and flank pain.   Assessment:  Blood cultures with GPC in clusters in 2/4 bottles, 1/2 sets. MSSA per BCID. This could represent contaminants but is in 2 bottles. MSSA is sensitive to ceftriaxone so would make sense to continue current course until we have more information, especially since we are treating a urinary source   Name of physician (or Provider) Contacted: Dr. Alanda Slim  Current antibiotics: ceftriaxone  Changes to prescribed antibiotics recommended:  none  Results for orders placed or performed during the hospital encounter of 03/13/21  Blood Culture ID Panel (Reflexed) (Collected: 03/13/2021  3:30 PM)  Result Value Ref Range   Enterococcus faecalis NOT DETECTED NOT DETECTED   Enterococcus Faecium NOT DETECTED NOT DETECTED   Listeria monocytogenes NOT DETECTED NOT DETECTED   Staphylococcus species DETECTED (A) NOT DETECTED   Staphylococcus aureus (BCID) DETECTED (A) NOT DETECTED   Staphylococcus epidermidis NOT DETECTED NOT DETECTED   Staphylococcus lugdunensis NOT DETECTED NOT DETECTED   Streptococcus species NOT DETECTED NOT DETECTED   Streptococcus agalactiae NOT DETECTED NOT DETECTED   Streptococcus pneumoniae NOT DETECTED NOT DETECTED   Streptococcus pyogenes NOT DETECTED NOT DETECTED   A.calcoaceticus-baumannii NOT DETECTED NOT DETECTED   Bacteroides fragilis NOT DETECTED NOT DETECTED   Enterobacterales NOT DETECTED NOT DETECTED   Enterobacter cloacae complex NOT DETECTED NOT DETECTED   Escherichia coli NOT DETECTED NOT DETECTED   Klebsiella aerogenes NOT DETECTED NOT DETECTED   Klebsiella oxytoca NOT DETECTED NOT DETECTED   Klebsiella pneumoniae NOT DETECTED NOT DETECTED   Proteus species NOT DETECTED NOT DETECTED   Salmonella species NOT DETECTED NOT DETECTED    Serratia marcescens NOT DETECTED NOT DETECTED   Haemophilus influenzae NOT DETECTED NOT DETECTED   Neisseria meningitidis NOT DETECTED NOT DETECTED   Pseudomonas aeruginosa NOT DETECTED NOT DETECTED   Stenotrophomonas maltophilia NOT DETECTED NOT DETECTED   Candida albicans NOT DETECTED NOT DETECTED   Candida auris NOT DETECTED NOT DETECTED   Candida glabrata NOT DETECTED NOT DETECTED   Candida krusei NOT DETECTED NOT DETECTED   Candida parapsilosis NOT DETECTED NOT DETECTED   Candida tropicalis NOT DETECTED NOT DETECTED   Cryptococcus neoformans/gattii NOT DETECTED NOT DETECTED   Meth resistant mecA/C and MREJ NOT DETECTED NOT DETECTED    Luisa Hart D 03/05/2021  9:07 AM

## 2021-03-05 NOTE — Consult Note (Signed)
Urology Consult   Physician requesting consult: Nestor LewandowskyJerry Gardner, DO  Reason for consult: Right renal hemorrhage  History of Present Illness: Greg Glenetta Bean is a 55 y.o. male with a 2 to 3 week intermittent episodes of history of gross hematuria associated with dull, intermittent, nonradiating right-sided flank pain.  He presented to the [facility] emergency department on 10-21-2021 with worsening flank pain and fatigue.  CT stone study revealed a hemorrhagic lesion involving the right kidney with concern for an underlying mass.  He was found to be profoundly anemic and thrombocytopenic on admission with an H&H of 7.8/23.2.  He states that he smoked on and off in his 5320s.  The patient denies a history of voiding or storage urinary symptoms, UTIs, STDs, urolithiasis, GU malignancy/trauma/surgery.  History reviewed. No pertinent past medical history.  Past Surgical History:  Procedure Laterality Date  . APPENDECTOMY    . SKIN GRAFT      Current Hospital Medications:  Home Meds:  No outpatient medications have been marked as taking for the 12/25/2020 encounter [facility](Hospital Encounter).    Scheduled Meds: . Chlorhexidine Gluconate Cloth  6 each Topical Daily  . mouth rinse  15 mL Mouth Rinse BID   Continuous Infusions: .  ceFAZolin (ANCEF) IV    . lactated ringers 100 mL/hr at 03/05/21 0925   PRN Meds:.fentaNYL (SUBLIMAZE) injection, ondansetron **OR** ondansetron (ZOFRAN) IV  Allergies: No Known Allergies  Family History  Problem Relation Age of Onset  . Cirrhosis Father   . Alcohol abuse Father     Social History:  reports that he has never smoked. He has never used smokeless tobacco. He reports previous alcohol use. He reports that he does not use drugs.  ROS: A complete review of systems was performed.  All systems are negative except for pertinent findings as noted.  Physical Exam:  Vital signs in last 24 hours: Temp:  [98 F (36.7 C)-100.7 F (38.2 C)] 99.2 F (37.3 C)  (05/14 0800) Pulse Rate:  [103-146] 112 (05/14 0800) Resp:  [17-42] 22 (05/14 0800) BP: (95-140)/(55-85) 130/79 (05/14 0800) SpO2:  [89 %-100 %] 94 % (05/14 0800) Weight:  [60.3 kg-65.4 kg] 65.4 kg (05/14 0040) Constitutional:  Alert and oriented, No acute distress Cardiovascular: Regular rate and rhythm, No JVD Respiratory: Normal respiratory effort, Lungs clear bilaterally GI: Abdomen is soft, nontender, nondistended, no abdominal masses GU: No CVA tenderness Lymphatic: No lymphadenopathy Neurologic: Grossly intact, no focal deficits Psychiatric: Normal mood and affect  Laboratory Data:  Recent Labs    003/02/2021 1530 03/05/21 0824  WBC 18.9* 19.0*  HGB 7.8* 8.6*  HCT 23.2* 25.0*  PLT 62* 97*    Recent Labs    003/02/2021 1619 03/05/21 0824  NA 126* 133*  K 3.9 3.8  CL 95* 101  GLUCOSE 108* 101*  BUN 41* 38*  CALCIUM 7.7* 8.2*  CREATININE 1.50* 1.22     Results for orders placed or performed during the hospital encounter of 003/02/2021 (from the past 24 hour(s))  Resp Panel by RT-PCR (Flu A&B, Covid) Nasopharyngeal Swab     Status: None   Collection Time: 003/02/2021  3:28 PM   Specimen: Nasopharyngeal Swab; Nasopharyngeal(NP) swabs in vial transport medium  Result Value Ref Range   SARS Coronavirus 2 by RT PCR NEGATIVE NEGATIVE   Influenza A by PCR NEGATIVE NEGATIVE   Influenza B by PCR NEGATIVE NEGATIVE  Lactic acid, plasma     Status: Abnormal   Collection Time: 003/02/2021  3:28 PM  Result  Value Ref Range   Lactic Acid, Venous 2.9 (HH) 0.5 - 1.9 mmol/L  Urinalysis, Routine w reflex microscopic Urine, Clean Catch     Status: Abnormal   Collection Time: 02/27/2021  3:28 PM  Result Value Ref Range   Color, Urine RED (A) YELLOW   APPearance HAZY (A) CLEAR   Specific Gravity, Urine 1.011 1.005 - 1.030   pH 6.0 5.0 - 8.0   Glucose, UA 50 (A) NEGATIVE mg/dL   Hgb urine dipstick MODERATE (A) NEGATIVE   Bilirubin Urine NEGATIVE NEGATIVE   Ketones, ur NEGATIVE NEGATIVE mg/dL    Protein, ur 191 (A) NEGATIVE mg/dL   Nitrite NEGATIVE NEGATIVE   Leukocytes,Ua NEGATIVE NEGATIVE   RBC / HPF >50 (H) 0 - 5 RBC/hpf   Bacteria, UA RARE (A) NONE SEEN  Urine rapid drug screen (hosp performed)     Status: None   Collection Time: 03/20/2021  3:28 PM  Result Value Ref Range   Opiates NONE DETECTED NONE DETECTED   Cocaine NONE DETECTED NONE DETECTED   Benzodiazepines NONE DETECTED NONE DETECTED   Amphetamines NONE DETECTED NONE DETECTED   Tetrahydrocannabinol NONE DETECTED NONE DETECTED   Barbiturates NONE DETECTED NONE DETECTED  CBC WITH DIFFERENTIAL     Status: Abnormal   Collection Time: 02/22/2021  3:30 PM  Result Value Ref Range   WBC 18.9 (H) 4.0 - 10.5 K/uL   RBC 2.55 (L) 4.22 - 5.81 MIL/uL   Hemoglobin 7.8 (L) 13.0 - 17.0 g/dL   HCT 47.8 (L) 29.5 - 62.1 %   MCV 91.0 80.0 - 100.0 fL   MCH 30.6 26.0 - 34.0 pg   MCHC 33.6 30.0 - 36.0 g/dL   RDW 30.8 (H) 65.7 - 84.6 %   Platelets 62 (L) 150 - 400 K/uL   nRBC 0.2 0.0 - 0.2 %   Neutrophils Relative % 86 %   Neutro Abs 16.2 (H) 1.7 - 7.7 K/uL   Lymphocytes Relative 9 %   Lymphs Abs 1.8 0.7 - 4.0 K/uL   Monocytes Relative 3 %   Monocytes Absolute 0.5 0.1 - 1.0 K/uL   Eosinophils Relative 0 %   Eosinophils Absolute 0.1 0.0 - 0.5 K/uL   Basophils Relative 0 %   Basophils Absolute 0.1 0.0 - 0.1 K/uL   Immature Granulocytes 2 %   Abs Immature Granulocytes 0.30 (H) 0.00 - 0.07 K/uL  Protime-INR     Status: Abnormal   Collection Time: 03/11/2021  3:30 PM  Result Value Ref Range   Prothrombin Time 28.7 (H) 11.4 - 15.2 seconds   INR 2.7 (H) 0.8 - 1.2  APTT     Status: None   Collection Time: 03/19/2021  3:30 PM  Result Value Ref Range   aPTT 35 24 - 36 seconds  Blood Culture (routine x 2)     Status: None (Preliminary result)   Collection Time: 03/02/2021  3:30 PM   Specimen: BLOOD  Result Value Ref Range   Specimen Description      BLOOD LEFT ARM Performed at [facility], [address]  [place], [place] [address]    Special Requests      BOTTLES DRAWN AEROBIC AND ANAEROBIC Blood Culture results may not be optimal due to an inadequate volume of blood received in culture bottles Performed at [facility], [address], [place], [place] [address]    Culture  Setup Time      GRAM POSITIVE COCCI IN CLUSTERS IN BOTH AEROBIC  AND ANAEROBIC BOTTLES CRITICAL RESULT CALLED TO, READ BACK BY AND VERIFIED WITH: SCOTT CHRISTY PHARMD  03/05/21 EB Performed at [facility], [address] [place], [place] [address]    Culture GRAM POSITIVE COCCI    Report Status PENDING   Reticulocytes     Status: Abnormal   Collection Time: 02/28/2021  3:30 PM  Result Value Ref Range   Retic Ct Pct 2.5 0.4 - 3.1 %   RBC. 2.56 (L) 4.22 - 5.81 MIL/uL   Retic Count, Absolute 64.0 19.0 - 186.0 K/uL   Immature Retic Fract 28.0 (H) 2.3 - 15.9 %  Blood Culture ID Panel (Reflexed)     Status: Abnormal   Collection Time: 03/01/2021  3:30 PM  Result Value Ref Range   Enterococcus faecalis NOT DETECTED NOT DETECTED   Enterococcus Faecium NOT DETECTED NOT DETECTED   Listeria monocytogenes NOT DETECTED NOT DETECTED   Staphylococcus species DETECTED (A) NOT DETECTED   Staphylococcus aureus (BCID) DETECTED (A) NOT DETECTED   Staphylococcus epidermidis NOT DETECTED NOT DETECTED   Staphylococcus lugdunensis NOT DETECTED NOT DETECTED   Streptococcus species NOT DETECTED NOT DETECTED   Streptococcus agalactiae NOT DETECTED NOT DETECTED   Streptococcus pneumoniae NOT DETECTED NOT DETECTED   Streptococcus pyogenes NOT DETECTED NOT DETECTED   A.calcoaceticus-baumannii NOT DETECTED NOT DETECTED   Bacteroides fragilis NOT DETECTED NOT DETECTED   Enterobacterales NOT DETECTED NOT DETECTED   Enterobacter cloacae complex NOT DETECTED NOT DETECTED   Escherichia coli NOT DETECTED NOT DETECTED   Klebsiella aerogenes NOT DETECTED NOT DETECTED   Klebsiella oxytoca NOT DETECTED NOT DETECTED    Klebsiella pneumoniae NOT DETECTED NOT DETECTED   Proteus species NOT DETECTED NOT DETECTED   Salmonella species NOT DETECTED NOT DETECTED   Serratia marcescens NOT DETECTED NOT DETECTED   Haemophilus influenzae NOT DETECTED NOT DETECTED   Neisseria meningitidis NOT DETECTED NOT DETECTED   Pseudomonas aeruginosa NOT DETECTED NOT DETECTED   Stenotrophomonas maltophilia NOT DETECTED NOT DETECTED   Candida albicans NOT DETECTED NOT DETECTED   Candida auris NOT DETECTED NOT DETECTED   Candida glabrata NOT DETECTED NOT DETECTED   Candida krusei NOT DETECTED NOT DETECTED   Candida parapsilosis NOT DETECTED NOT DETECTED   Candida tropicalis NOT DETECTED NOT DETECTED   Cryptococcus neoformans/gattii NOT DETECTED NOT DETECTED   Meth resistant mecA/C and MREJ NOT DETECTED NOT DETECTED  Blood Culture (routine x 2)     Status: None (Preliminary result)   Collection Time: 03/21/2021  3:33 PM   Specimen: BLOOD  Result Value Ref Range   Specimen Description      BLOOD RIGHT ANTECUBITAL Performed at [facility], [address] [place], [place] [address]    Special Requests      BOTTLES DRAWN AEROBIC AND ANAEROBIC Blood Culture results may not be optimal due to an inadequate volume of blood received in culture bottles Performed at [facility], [address], [place], [place] [address]    Culture  Setup Time      GRAM POSITIVE COCCI IN CLUSTERS ANAEROBIC BOTTLE ONLY CRITICAL VALUE NOTED.  VALUE IS CONSISTENT WITH PREVIOUSLY REPORTED AND CALLED VALUE. Performed at [facility], [address] [place], [place] [address]    Culture GRAM POSITIVE COCCI    Report Status PENDING   Comprehensive metabolic panel     Status: Abnormal   Collection Time: 02/20/2021  4:19 PM  Result Value Ref Range   Sodium 126 (L) 135 -  145 mmol/L   Potassium 3.9 3.5 - 5.1 mmol/L   Chloride 95 (L) 98 - 111 mmol/L   CO2 22 22 - 32 mmol/L   Glucose, Bld 108 (H) 70 - 99 mg/dL    BUN 41 (H) 6 - 20 mg/dL   Creatinine, Ser 1.61 (H) 0.61 - 1.24 mg/dL   Calcium 7.7 (L) 8.9 - 10.3 mg/dL   Total Protein 8.3 (H) 6.5 - 8.1 g/dL   Albumin 1.6 (L) 3.5 - 5.0 g/dL   AST 76 (H) 15 - 41 U/L   ALT 41 0 - 44 U/L   Alkaline Phosphatase 81 38 - 126 U/L   Total Bilirubin 5.1 (H) 0.3 - 1.2 mg/dL   GFR, Estimated 55 (L) >60 mL/min   Anion gap 9 5 - 15  Lipase, blood     Status: None   Collection Time: 02/22/2021  4:19 PM  Result Value Ref Range   Lipase 47 11 - 51 U/L  Magnesium     Status: None   Collection Time: 02/23/2021  4:19 PM  Result Value Ref Range   Magnesium 2.0 1.7 - 2.4 mg/dL  Hepatitis panel, acute     Status: None   Collection Time: 03/19/2021  6:50 PM  Result Value Ref Range   Hepatitis B Surface Ag NON REACTIVE NON REACTIVE   HCV Ab NON REACTIVE NON REACTIVE   Hep A IgM NON REACTIVE NON REACTIVE   Hep B C IgM NON REACTIVE NON REACTIVE  Type and screen [facility]     Status: None (Preliminary result)   Collection Time: 02/24/2021  6:58 PM  Result Value Ref Range   ABO/RH(D) B POS    Antibody Screen NEG    Sample Expiration 03/07/2021,2359    Unit Number [id number]    Blood Component Type RED CELLS,LR    Unit division 00    Status of Unit ISSUED    Transfusion Status OK TO TRANSFUSE    Crossmatch Result      Compatible Performed at [facility], [address], [place], [place] [address]   Folate     Status: None   Collection Time: 02/28/2021  7:02 PM  Result Value Ref Range   Folate 11.0 >5.9 ng/mL  ABO/Rh     Status: None   Collection Time: 03/03/2021  7:20 PM  Result Value Ref Range   ABO/RH(D)      B POS Performed at [facility], [address] [place], [place] [address]   Prepare RBC (crossmatch)     Status: None   Collection Time: 03/20/2021  9:25 PM  Result Value Ref Range   Order Confirmation      ORDER PROCESSED BY BLOOD BANK Performed at [facility], [address], [place], [place] [address]   Prepare fresh frozen plasma     Status: None (Preliminary result)   Collection Time: 03/14/2021  9:25 PM  Result Value Ref Range   Unit Number [id number]    Blood Component Type THW PLS APHR    Unit division B0    Status of Unit ISSUED    Transfusion Status OK TO TRANSFUSE    Unit Number [id number]    Blood Component Type THAWED PLASMA    Unit division 00    Status of Unit ISSUED    Transfusion Status      OK TO TRANSFUSE Performed at [facility], [address] [place], [place] [address]  Unit Number [id number]    Blood Component Type THW PLS APHR    Unit division 00    Status of Unit ISSUED    Transfusion Status OK TO TRANSFUSE   Lactic acid, plasma     Status: None   Collection Time: 03/22/2021  9:27 PM  Result Value Ref Range   Lactic Acid, Venous 1.9 0.5 - 1.9 mmol/L  Ammonia     Status: Abnormal   Collection Time: Mar 22, 2021  9:27 PM  Result Value Ref Range   Ammonia 48 (H) 9 - 35 umol/L  Ethanol     Status: None   Collection Time: Mar 22, 2021  9:27 PM  Result Value Ref Range   Alcohol, Ethyl (B) <10 <10 mg/dL  Hepatic function panel     Status: Abnormal   Collection Time: 03-22-2021  9:27 PM  Result Value Ref Range   Total Protein 7.3 6.5 - 8.1 g/dL   Albumin 1.4 (L) 3.5 - 5.0 g/dL   AST 71 (H) 15 - 41 U/L   ALT 40 0 - 44 U/L   Alkaline Phosphatase 76 38 - 126 U/L   Total Bilirubin 4.7 (H) 0.3 - 1.2 mg/dL   Bilirubin, Direct 2.1 (H) 0.0 - 0.2 mg/dL   Indirect Bilirubin 2.6 (H) 0.3 - 0.9 mg/dL  MRSA PCR Screening     Status: None   Collection Time: 03/05/21  8:19 AM   Specimen: Nasal Mucosa; Nasopharyngeal  Result Value Ref Range   MRSA by PCR NEGATIVE NEGATIVE  Protime-INR     Status: Abnormal   Collection Time: 03/05/21  8:24 AM  Result Value Ref Range   Prothrombin Time 21.8 (H) 11.4 - 15.2 seconds   INR 1.9 (H) 0.8 - 1.2  CBC     Status: Abnormal   Collection Time: 03/05/21  8:24 AM  Result  Value Ref Range   WBC 19.0 (H) 4.0 - 10.5 K/uL   RBC 2.78 (L) 4.22 - 5.81 MIL/uL   Hemoglobin 8.6 (L) 13.0 - 17.0 g/dL   HCT 11.9 (L) 14.7 - 82.9 %   MCV 89.9 80.0 - 100.0 fL   MCH 30.9 26.0 - 34.0 pg   MCHC 34.4 30.0 - 36.0 g/dL   RDW 56.2 (H) 13.0 - 86.5 %   Platelets 97 (L) 150 - 400 K/uL   nRBC 0.0 0.0 - 0.2 %  Comprehensive metabolic panel     Status: Abnormal   Collection Time: 03/05/21  8:24 AM  Result Value Ref Range   Sodium 133 (L) 135 - 145 mmol/L   Potassium 3.8 3.5 - 5.1 mmol/L   Chloride 101 98 - 111 mmol/L   CO2 24 22 - 32 mmol/L   Glucose, Bld 101 (H) 70 - 99 mg/dL   BUN 38 (H) 6 - 20 mg/dL   Creatinine, Ser 7.84 0.61 - 1.24 mg/dL   Calcium 8.2 (L) 8.9 - 10.3 mg/dL   Total Protein 8.0 6.5 - 8.1 g/dL   Albumin 1.8 (L) 3.5 - 5.0 g/dL   AST 64 (H) 15 - 41 U/L   ALT 37 0 - 44 U/L   Alkaline Phosphatase 85 38 - 126 U/L   Total Bilirubin 6.5 (H) 0.3 - 1.2 mg/dL   GFR, Estimated >69 >62 mL/min   Anion gap 8 5 - 15  Uric acid     Status: None   Collection Time: 03/05/21  8:24 AM  Result Value Ref Range   Uric Acid, Serum 5.0 3.7 - 8.6 mg/dL  Recent Results (from the past 240 hour(s))  Resp Panel by RT-PCR (Flu A&B, Covid) Nasopharyngeal Swab     Status: None   Collection Time: 2021/03/17  3:28 PM   Specimen: Nasopharyngeal Swab; Nasopharyngeal(NP) swabs in vial transport medium  Result Value Ref Range Status   SARS Coronavirus 2 by RT PCR NEGATIVE NEGATIVE Final    Comment: (NOTE) SARS-CoV-2 target nucleic acids are NOT DETECTED.  The SARS-CoV-2 RNA is generally detectable in upper respiratory specimens during the acute phase of infection. The lowest concentration of SARS-CoV-2 viral copies this assay can detect is 138 copies/mL. A negative result does not preclude SARS-Cov-2 infection and should not be used as the sole basis for treatment or other patient management decisions. A negative result may occur with  improper specimen collection/handling, submission  of specimen other than nasopharyngeal swab, presence of viral mutation(s) within the areas targeted by this assay, and inadequate number of viral copies(<138 copies/mL). A negative result must be combined with clinical observations, patient history, and epidemiological information. The expected result is Negative.  Fact Sheet for Patients:  BloggerCourse.com  Fact Sheet for Healthcare Providers:  SeriousBroker.it  This test is no t yet approved or cleared by the [place] FDA and  has been authorized for detection and/or diagnosis of SARS-CoV-2 by FDA under an Emergency Use Authorization (EUA). This EUA will remain  in effect (meaning this test can be used) for the duration of the COVID-19 declaration under Section 564(b)(1) of the Act, 21 U.S.C.section 360bbb-3(b)(1), unless the authorization is terminated  or revoked sooner.       Influenza A by PCR NEGATIVE NEGATIVE Final   Influenza B by PCR NEGATIVE NEGATIVE Final    Comment: (NOTE) The Xpert Xpress SARS-CoV-2/FLU/RSV plus assay is intended as an aid in the diagnosis of influenza from Nasopharyngeal swab specimens and should not be used as a sole basis for treatment. Nasal washings and aspirates are unacceptable for Xpert Xpress SARS-CoV-2/FLU/RSV testing.  Fact Sheet for Patients: BloggerCourse.com  Fact Sheet for Healthcare Providers: SeriousBroker.it  This test is not yet approved or cleared by the [place] FDA and has been authorized for detection and/or diagnosis of SARS-CoV-2 by FDA under an Emergency Use Authorization (EUA). This EUA will remain in effect (meaning this test can be used) for the duration of the COVID-19 declaration under Section 564(b)(1) of the Act, 21 U.S.C. section 360bbb-3(b)(1), unless the authorization is terminated or revoked.  Performed at [facility], [address] [place], [place] [address]   Blood Culture (routine x 2)     Status: None (Preliminary result)   Collection Time: 2021/03/17  3:30 PM   Specimen: BLOOD  Result Value Ref Range Status   Specimen Description   Final    BLOOD LEFT ARM Performed at [facility], [address] [place], [place] [address]    Special Requests   Final    BOTTLES DRAWN AEROBIC AND ANAEROBIC Blood Culture results may not be optimal due to an inadequate volume of blood received in culture bottles Performed at [facility], [address], [place], [place] [address]    Culture  Setup Time   Final    GRAM POSITIVE COCCI IN CLUSTERS IN BOTH AEROBIC AND ANAEROBIC BOTTLES CRITICAL RESULT CALLED TO, READ BACK BY AND VERIFIED WITH: SCOTT CHRISTY PHARMD @0850  03/05/21 EB Performed at [facility], [address] [place], [place]    Culture GRAM POSITIVE COCCI  Final   Report Status PENDING  Incomplete  Blood Culture ID Panel (Reflexed)     Status: Abnormal   Collection Time: 03/09/2021  3:30 PM  Result Value Ref Range Status   Enterococcus faecalis NOT DETECTED NOT DETECTED Final   Enterococcus Faecium NOT DETECTED NOT DETECTED Final   Listeria monocytogenes NOT DETECTED NOT DETECTED Final   Staphylococcus species DETECTED (A) NOT DETECTED Final    Comment: CRITICAL RESULT CALLED TO, READ BACK BY AND VERIFIED WITH: SCOTT CHRISTY PHARMD @0850  03/05/21 EB    Staphylococcus aureus (BCID) DETECTED (A) NOT DETECTED Final    Comment: CRITICAL RESULT CALLED TO, READ BACK BY AND VERIFIED WITH: SCOTT CHRISTY PHARMD @0850  03/05/21 EB    Staphylococcus epidermidis NOT DETECTED NOT DETECTED Final   Staphylococcus lugdunensis NOT DETECTED NOT DETECTED Final   Streptococcus species NOT DETECTED NOT DETECTED Final   Streptococcus agalactiae NOT DETECTED NOT DETECTED Final   Streptococcus pneumoniae NOT DETECTED NOT DETECTED Final   Streptococcus pyogenes NOT DETECTED  NOT DETECTED Final   A.calcoaceticus-baumannii NOT DETECTED NOT DETECTED Final   Bacteroides fragilis NOT DETECTED NOT DETECTED Final   Enterobacterales NOT DETECTED NOT DETECTED Final   Enterobacter cloacae complex NOT DETECTED NOT DETECTED Final   Escherichia coli NOT DETECTED NOT DETECTED Final   Klebsiella aerogenes NOT DETECTED NOT DETECTED Final   Klebsiella oxytoca NOT DETECTED NOT DETECTED Final   Klebsiella pneumoniae NOT DETECTED NOT DETECTED Final   Proteus species NOT DETECTED NOT DETECTED Final   Salmonella species NOT DETECTED NOT DETECTED Final   Serratia marcescens NOT DETECTED NOT DETECTED Final   Haemophilus influenzae NOT DETECTED NOT DETECTED Final   Neisseria meningitidis NOT DETECTED NOT DETECTED Final   Pseudomonas aeruginosa NOT DETECTED NOT DETECTED Final   Stenotrophomonas maltophilia NOT DETECTED NOT DETECTED Final   Candida albicans NOT DETECTED NOT DETECTED Final   Candida auris NOT DETECTED NOT DETECTED Final   Candida glabrata NOT DETECTED NOT DETECTED Final   Candida krusei NOT DETECTED NOT DETECTED Final   Candida parapsilosis NOT DETECTED NOT DETECTED Final   Candida tropicalis NOT DETECTED NOT DETECTED Final   Cryptococcus neoformans/gattii NOT DETECTED NOT DETECTED Final   Meth resistant mecA/C and MREJ NOT DETECTED NOT DETECTED Final    Comment: Performed at [facility], [address], [place], [place] [address]  Blood Culture (routine x 2)     Status: None (Preliminary result)   Collection Time: 03/02/2021  3:33 PM   Specimen: BLOOD  Result Value Ref Range Status   Specimen Description   Final    BLOOD RIGHT ANTECUBITAL Performed at [facility], [address] [place], [place] [address]    Special Requests   Final    BOTTLES DRAWN AEROBIC AND ANAEROBIC Blood Culture results may not be optimal due to an inadequate volume of blood received in culture bottles Performed at [facility], [address] [place], [place] [address]    Culture  Setup Time   Final    GRAM POSITIVE COCCI IN CLUSTERS ANAEROBIC BOTTLE ONLY CRITICAL VALUE NOTED.  VALUE IS CONSISTENT WITH PREVIOUSLY REPORTED AND CALLED VALUE. Performed at [facility], [address] [place], [place] [address]    Culture GRAM POSITIVE COCCI  Final   Report Status PENDING  Incomplete  MRSA PCR Screening     Status: None   Collection Time: 03/05/21  8:19 AM   Specimen: Nasal Mucosa; Nasopharyngeal  Result Value Ref  Range Status   MRSA by PCR NEGATIVE NEGATIVE Final    Comment:        The GeneXpert MRSA Assay (FDA approved for NASAL specimens only), is one component of a comprehensive MRSA colonization surveillance program. It is not intended to diagnose MRSA infection nor to guide or monitor treatment for MRSA infections. Performed at [facility], [address], [place], [place] [address]     Renal Function: Recent Labs    03/20/2021 1619 03/05/21 0824  CREATININE 1.50* 1.22   Estimated Creatinine Clearance: 63.3 mL/min (by C-G formula based on SCr of 1.22 mg/dL).  Radiologic Imaging: CT ABDOMEN PELVIS WO CONTRAST  Result Date: 02/22/2021 CLINICAL DATA:  Fever with hematuria EXAM: CT ABDOMEN AND PELVIS WITHOUT CONTRAST TECHNIQUE: Multidetector CT imaging of the abdomen and pelvis was performed following the standard protocol without oral or IV contrast. COMPARISON:  None. FINDINGS: Lower chest: There are small pleural effusions bilaterally. There is scarring with lower lobe bronchiectatic change in the base regions. There are scattered foci of coronary artery calcification. Hepatobiliary: The liver contour is nodular, likely due to a degree of underlying hepatic cirrhosis. No focal liver lesions are appreciable on this noncontrast enhanced study. Gallbladder wall is not appreciably thickened. There is no biliary duct dilatation. Pancreas: There is no appreciable pancreatic mass or inflammatory  focus. Spleen: No splenic lesions are evident. There are apparent varices in the splenic region. Adrenals/Urinary Tract: Adrenals bilaterally appear normal. The right kidney appears edematous. There is mild soft tissue stranding in the perinephric fascia on the right. There is an area of increased attenuation in the upper pole the right kidney measuring 5.5 x 5.3 x 5.2 cm. This lesion has an appearance suggesting focal hemorrhage within this area. This lesion may connect with the upper pole collecting system. There is no hydronephrosis on the right. There is no demonstrable mass in the left kidney on this noncontrast enhanced study. No hydronephrosis. There is a 3 x 3 mm calculus in the lower pole the right kidney. There is no appreciable ureteral calculus on either side. Urinary bladder is midline with wall thickness within normal limits. Stomach/Bowel: There is no appreciable bowel wall or mesenteric thickening. No appreciable bowel obstruction. Terminal ileum appears normal. Appendix absent. No free air or portal venous air. Vascular/Lymphatic: No abdominal aortic aneurysm. Foci of calcification noted in each common iliac artery. No adenopathy is evident in the abdomen or pelvis. Subcentimeter inguinal lymph nodes are considered nonspecific. Reproductive: Occasional prostatic calculi noted. Prostate and seminal vesicles normal in size and contour. Other: There is a focal umbilical region hernia containing fat and mild panniculitis. This hernia at its neck measures 1.4 cm from right to left dimension and 1.5 cm from superior to inferior dimension. No bowel containing hernia is evident. There is no abscess or ascites in the abdomen or pelvis. Musculoskeletal: No blastic or lytic bone lesions. No intramuscular lesions. IMPRESSION: 1. Area of increased attenuation in the upper pole of the right kidney measuring 5.5 x 5.3 x 5.2 cm which has an appearance concerning for localized hemorrhage. Etiology for focal  hemorrhage in this area is uncertain. Hemorrhage within a mass or vascular malformation could present in this manner. This finding may warrant CT angiogram for further assessment and characterization. This lesion abuts and may invade a portion of the upper pole collecting system which could account for hematuria. Note that the right kidney appears edematous with mild stranding in the right perinephric fascia. 2. Nonobstructing calculus  in the lower pole right kidney measuring 3 x 3 mm. 3. 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 No focal liver lesions evident on noncontrast enhanced study. 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 Spleen is not enlarged. 4. Umbilical hernia containing fat and mild panniculitis. No bowel containing hernia evident. 5. No evident bowel obstruction. No abscess in the abdomen or pelvis. Appendix absent. 6. Small pleural effusions bilaterally. Areas of scarring and bronchiectasis in the lung bases. 7.  Foci of iliac artery and coronary artery calcification. Electronically Signed   By: Bretta Bang III M.D.   On: 03-09-2021 20:08   DG Chest Port 1 View  Result Date: 03/09/2021 CLINICAL DATA:  Weakness EXAM: PORTABLE CHEST 1 VIEW COMPARISON:  None. FINDINGS: There is mild fullness of the AP window, possibly due to technique. Heart is normal in size. No pleural effusion. No pneumothorax. No acute pleuroparenchymal abnormality. Visualized abdomen is unremarkable. No acute osseous abnormality noted. IMPRESSION: 1. Mild fullness of the AP window, likely due to technique. Consider repeat PA and lateral chest radiograph for improved evaluation. 2. Otherwise no acute cardiopulmonary abnormality. Electronically Signed   By: Meda Klinefelter MD   On: 2021/03/09 16:29    I independently reviewed the above imaging studies.  Impression/Recommendation 55 year old male with a hemorrhagic mass involving the right kidney associated  with symptomatic anemia, elevated INR, acute kidney injury and leukocytosis  -CT angio pending to assess for persistent hemorrhage from the right kidney and to identify an area that could be amenable to angioembolization. -Continue IV fluid resuscitation -Transfuse as needed -Will continue to monitor  Rhoderick Moody, MD [facility] 03/05/2021, 10:32 AM

## 2021-03-05 NOTE — Progress Notes (Signed)
  Echocardiogram 2D Echocardiogram has been performed.  Greg Bean 03/05/2021, 2:27 PM

## 2021-03-06 ENCOUNTER — Inpatient Hospital Stay (HOSPITAL_COMMUNITY): Payer: 59

## 2021-03-06 DIAGNOSIS — N179 Acute kidney failure, unspecified: Secondary | ICD-10-CM | POA: Diagnosis not present

## 2021-03-06 DIAGNOSIS — K7030 Alcoholic cirrhosis of liver without ascites: Secondary | ICD-10-CM

## 2021-03-06 DIAGNOSIS — D696 Thrombocytopenia, unspecified: Secondary | ICD-10-CM

## 2021-03-06 DIAGNOSIS — D649 Anemia, unspecified: Secondary | ICD-10-CM

## 2021-03-06 DIAGNOSIS — R7881 Bacteremia: Secondary | ICD-10-CM | POA: Diagnosis not present

## 2021-03-06 DIAGNOSIS — D62 Acute posthemorrhagic anemia: Secondary | ICD-10-CM | POA: Diagnosis not present

## 2021-03-06 DIAGNOSIS — R531 Weakness: Secondary | ICD-10-CM

## 2021-03-06 DIAGNOSIS — A419 Sepsis, unspecified organism: Secondary | ICD-10-CM | POA: Diagnosis not present

## 2021-03-06 DIAGNOSIS — M00032 Staphylococcal arthritis, left wrist: Secondary | ICD-10-CM

## 2021-03-06 LAB — COMPREHENSIVE METABOLIC PANEL
ALT: 32 U/L (ref 0–44)
AST: 59 U/L — ABNORMAL HIGH (ref 15–41)
Albumin: 1.6 g/dL — ABNORMAL LOW (ref 3.5–5.0)
Alkaline Phosphatase: 85 U/L (ref 38–126)
Anion gap: 9 (ref 5–15)
BUN: 31 mg/dL — ABNORMAL HIGH (ref 6–20)
CO2: 21 mmol/L — ABNORMAL LOW (ref 22–32)
Calcium: 8 mg/dL — ABNORMAL LOW (ref 8.9–10.3)
Chloride: 103 mmol/L (ref 98–111)
Creatinine, Ser: 1.07 mg/dL (ref 0.61–1.24)
GFR, Estimated: >60 mL/min (ref >60)
Glucose, Bld: 122 mg/dL — ABNORMAL HIGH (ref 70–99)
Potassium: 3.7 mmol/L (ref 3.5–5.1)
Sodium: 133 mmol/L — ABNORMAL LOW (ref 135–145)
Total Bilirubin: 5.2 mg/dL — ABNORMAL HIGH (ref 0.3–1.2)
Total Protein: 7.7 g/dL (ref 6.5–8.1)

## 2021-03-06 LAB — HIV ANTIBODY (ROUTINE TESTING W REFLEX): HIV Screen 4th Generation wRfx: NONREACTIVE

## 2021-03-06 LAB — MAGNESIUM: Magnesium: 1.9 mg/dL (ref 1.7–2.4)

## 2021-03-06 LAB — CBC
HCT: 24.7 % — ABNORMAL LOW (ref 39.0–52.0)
Hemoglobin: 8.3 g/dL — ABNORMAL LOW (ref 13.0–17.0)
MCH: 30.7 pg (ref 26.0–34.0)
MCHC: 33.6 g/dL (ref 30.0–36.0)
MCV: 91.5 fL (ref 80.0–100.0)
Platelets: 103 10*3/uL — ABNORMAL LOW (ref 150–400)
RBC: 2.7 MIL/uL — ABNORMAL LOW (ref 4.22–5.81)
RDW: 16.5 % — ABNORMAL HIGH (ref 11.5–15.5)
WBC: 16.3 10*3/uL — ABNORMAL HIGH (ref 4.0–10.5)
nRBC: 0 % (ref 0.0–0.2)

## 2021-03-06 LAB — PROTIME-INR
INR: 2.2 — ABNORMAL HIGH (ref 0.8–1.2)
Prothrombin Time: 24.4 seconds — ABNORMAL HIGH (ref 11.4–15.2)

## 2021-03-06 LAB — APTT: aPTT: 35 seconds (ref 24–36)

## 2021-03-06 LAB — AMMONIA: Ammonia: 47 umol/L — ABNORMAL HIGH (ref 9–35)

## 2021-03-06 LAB — PHOSPHORUS: Phosphorus: 3 mg/dL (ref 2.5–4.6)

## 2021-03-06 MED ORDER — VITAMIN K1 10 MG/ML IJ SOLN
5.0000 mg | Freq: Once | INTRAVENOUS | Status: AC
  Administered 2021-03-06: 5 mg via INTRAVENOUS
  Filled 2021-03-06: qty 0.5

## 2021-03-06 MED ORDER — ADULT MULTIVITAMIN W/MINERALS CH
1.0000 | ORAL_TABLET | Freq: Every day | ORAL | Status: DC
  Administered 2021-03-06 – 2021-03-10 (×4): 1 via ORAL
  Filled 2021-03-06 (×4): qty 1

## 2021-03-06 MED ORDER — PROSOURCE PLUS PO LIQD
30.0000 mL | Freq: Two times a day (BID) | ORAL | Status: DC
  Administered 2021-03-06 – 2021-03-10 (×6): 30 mL via ORAL
  Filled 2021-03-06 (×6): qty 30

## 2021-03-06 MED ORDER — ENSURE ENLIVE PO LIQD
237.0000 mL | Freq: Two times a day (BID) | ORAL | Status: DC
  Administered 2021-03-06 – 2021-03-09 (×4): 237 mL via ORAL

## 2021-03-06 MED ORDER — LACTULOSE 10 GM/15ML PO SOLN
20.0000 g | Freq: Two times a day (BID) | ORAL | Status: DC
  Administered 2021-03-06 – 2021-03-10 (×7): 20 g via ORAL
  Filled 2021-03-06 (×7): qty 30

## 2021-03-06 MED ORDER — ORAL CARE MOUTH RINSE
15.0000 mL | Freq: Two times a day (BID) | OROMUCOSAL | Status: DC
  Administered 2021-03-06 – 2021-03-10 (×9): 15 mL via OROMUCOSAL

## 2021-03-06 NOTE — Progress Notes (Signed)
Subjective: No new complaints   Antibiotics:  Anti-infectives (From admission, onward)   Start     Dose/Rate Route Frequency Ordered Stop   03/05/21 1030  nafcillin injection 2 g  Status:  Discontinued        2 g Intravenous Every 4 hours 03/05/21 0917 03/05/21 0937   03/05/21 1030  ceFAZolin (ANCEF) IVPB 2g/100 mL premix  Status:  Discontinued        2 g 200 mL/hr over 30 Minutes Intravenous Every 8 hours 03/05/21 0938 03/05/21 0941   03/05/21 1030  ceFAZolin (ANCEF) IVPB 2g/100 mL premix        2 g 200 mL/hr over 30 Minutes Intravenous Every 8 hours 03/05/21 0950     03/14/2021 1530  cefTRIAXone (ROCEPHIN) 1 g in sodium chloride 0.9 % 100 mL IVPB  Status:  Discontinued        1 g 200 mL/hr over 30 Minutes Intravenous Every 24 hours 02/25/2021 1529 03/05/21 0915      Medications: Scheduled Meds: . (feeding supplement) PROSource Plus  30 mL Oral BID BM  . Chlorhexidine Gluconate Cloth  6 each Topical Daily  . feeding supplement  237 mL Oral BID BM  . lactulose  20 g Oral BID  . mouth rinse  15 mL Mouth Rinse BID  . multivitamin with minerals  1 tablet Oral Daily   Continuous Infusions: .  ceFAZolin (ANCEF) IV 200 mL/hr at 03/06/21 1735   PRN Meds:.fentaNYL (SUBLIMAZE) injection, ondansetron **OR** ondansetron (ZOFRAN) IV    Objective: Weight change:   Intake/Output Summary (Last 24 hours) at 03/06/2021 1811 Last data filed at 03/06/2021 1735 Gross per 24 hour  Intake 1800.93 ml  Output 1950 ml  Net -149.07 ml   Blood pressure 140/74, pulse (!) 103, temperature 99.1 F (37.3 C), temperature source Oral, resp. rate (!) 25, height 5\' 9"  (1.753 m), weight 65.4 kg, SpO2 98 %. Temp:  [98.2 F (36.8 C)-99.4 F (37.4 C)] 99.1 F (37.3 C) (05/15 1700) Pulse Rate:  [98-115] 103 (05/15 1600) Resp:  [17-42] 25 (05/15 1600) BP: (127-159)/(66-81) 140/74 (05/15 1600) SpO2:  [88 %-100 %] 98 % (05/15 1600)  Physical Exam: Physical Exam Constitutional:       Appearance: He is well-developed and underweight. He is ill-appearing.  HENT:     Head: Normocephalic and atraumatic.  Eyes:     Extraocular Movements: Extraocular movements intact.     Conjunctiva/sclera: Conjunctivae normal.  Cardiovascular:     Rate and Rhythm: Regular rhythm. Tachycardia present.     Heart sounds: Murmur heard.    Pulmonary:     Effort: Pulmonary effort is normal. No respiratory distress.     Breath sounds: No stridor. No wheezing or rhonchi.  Abdominal:     General: Bowel sounds are normal. There is distension.     Palpations: Abdomen is soft. There is no mass.     Hernia: No hernia is present.  Musculoskeletal:        General: Normal range of motion.     Left wrist: Deformity, tenderness and bony tenderness present.     Cervical back: Normal range of motion and neck supple.  Skin:    General: Skin is warm and dry.     Findings: No erythema or rash.  Neurological:     Mental Status: He is alert and oriented to person, place, and time.  Psychiatric:        Behavior: Behavior normal.  Thought Content: Thought content normal.        Judgment: Judgment normal.      CBC:    BMET Recent Labs    03/05/21 0824 03/06/21 0329  NA 133* 133*  K 3.8 3.7  CL 101 103  CO2 24 21*  GLUCOSE 101* 122*  BUN 38* 31*  CREATININE 1.22 1.07  CALCIUM 8.2* 8.0*     Liver Panel  Recent Labs    Mar 15, 2021 2127 03/05/21 0824 03/06/21 0329  PROT 7.3 8.0 7.7  ALBUMIN 1.4* 1.8* 1.6*  AST 71* 64* 59*  ALT 40 37 32  ALKPHOS 76 85 85  BILITOT 4.7* 6.5* 5.2*  BILIDIR 2.1*  --   --   IBILI 2.6*  --   --        Sedimentation Rate No results for input(s): ESRSEDRATE in the last 72 hours. C-Reactive Protein No results for input(s): CRP in the last 72 hours.  Micro Results: Recent Results (from the past 720 hour(s))  Resp Panel by RT-PCR (Flu A&B, Covid) Nasopharyngeal Swab     Status: None   Collection Time: Mar 15, 2021  3:28 PM   Specimen:  Nasopharyngeal Swab; Nasopharyngeal(NP) swabs in vial transport medium  Result Value Ref Range Status   SARS Coronavirus 2 by RT PCR NEGATIVE NEGATIVE Final    Comment: (NOTE) SARS-CoV-2 target nucleic acids are NOT DETECTED.  The SARS-CoV-2 RNA is generally detectable in upper respiratory specimens during the acute phase of infection. The lowest concentration of SARS-CoV-2 viral copies this assay can detect is 138 copies/mL. A negative result does not preclude SARS-Cov-2 infection and should not be used as the sole basis for treatment or other patient management decisions. A negative result may occur with  improper specimen collection/handling, submission of specimen other than nasopharyngeal swab, presence of viral mutation(s) within the areas targeted by this assay, and inadequate number of viral copies(<138 copies/mL). A negative result must be combined with clinical observations, patient history, and epidemiological information. The expected result is Negative.  Fact Sheet for Patients:  BloggerCourse.com  Fact Sheet for Healthcare Providers:  SeriousBroker.it  This test is no t yet approved or cleared by the [place] FDA and  has been authorized for detection and/or diagnosis of SARS-CoV-2 by FDA under an Emergency Use Authorization (EUA). This EUA will remain  in effect (meaning this test can be used) for the duration of the COVID-19 declaration under Section 564(b)(1) of the Act, 21 U.S.C.section 360bbb-3(b)(1), unless the authorization is terminated  or revoked sooner.       Influenza A by PCR NEGATIVE NEGATIVE Final   Influenza B by PCR NEGATIVE NEGATIVE Final    Comment: (NOTE) The Xpert Xpress SARS-CoV-2/FLU/RSV plus assay is intended as an aid in the diagnosis of influenza from Nasopharyngeal swab specimens and should not be used as a sole basis for treatment. Nasal washings and aspirates are unacceptable for  Xpert Xpress SARS-CoV-2/FLU/RSV testing.  Fact Sheet for Patients: BloggerCourse.com  Fact Sheet for Healthcare Providers: SeriousBroker.it  This test is not yet approved or cleared by the [place] FDA and has been authorized for detection and/or diagnosis of SARS-CoV-2 by FDA under an Emergency Use Authorization (EUA). This EUA will remain in effect (meaning this test can be used) for the duration of the COVID-19 declaration under Section 564(b)(1) of the Act, 21 U.S.C. section 360bbb-3(b)(1), unless the authorization is terminated or revoked.  Performed at [facility], [address], [place], [place] [address]  Urine culture     Status: Abnormal (Preliminary result)   Collection Time: 03/03/2021  3:28 PM   Specimen: In/Out Cath Urine  Result Value Ref Range Status   Specimen Description   Final    IN/OUT CATH URINE Performed at [facility], [address] [place], [place] [address]    Special Requests   Final    NONE Performed at [facility], [address] [place], [place] [address]    Culture (A)  Final    >=100,000 COLONIES/mL STAPHYLOCOCCUS AUREUS SUSCEPTIBILITIES TO FOLLOW Performed at [facility], [address] [place], [place] [address]    Report Status PENDING  Incomplete  Blood Culture (routine x 2)     Status: Abnormal (Preliminary result)   Collection Time: 03/22/2021  3:30 PM   Specimen: BLOOD  Result Value Ref Range Status   Specimen Description   Final    BLOOD LEFT ARM Performed at [facility], [address] [place], [place] [address]    Special Requests   Final    BOTTLES DRAWN AEROBIC AND ANAEROBIC Blood Culture results may not be optimal due to an inadequate volume of blood received in culture bottles Performed at [facility], [address], [place], [place] [address]    Culture   Setup Time   Final    GRAM POSITIVE COCCI IN CLUSTERS IN BOTH AEROBIC AND ANAEROBIC BOTTLES CRITICAL RESULT CALLED TO, READ BACK BY AND VERIFIED WITH: SCOTT CHRISTY PHARMD  03/05/21 EB    Culture (A)  Final    STAPHYLOCOCCUS AUREUS SUSCEPTIBILITIES TO FOLLOW Performed at [facility], [address], [place], [place] [address]    Report Status PENDING  Incomplete  Blood Culture ID Panel (Reflexed)     Status: Abnormal   Collection Time: 03/18/2021  3:30 PM  Result Value Ref Range Status   Enterococcus faecalis NOT DETECTED NOT DETECTED Final   Enterococcus Faecium NOT DETECTED NOT DETECTED Final   Listeria monocytogenes NOT DETECTED NOT DETECTED Final   Staphylococcus species DETECTED (A) NOT DETECTED Final    Comment: CRITICAL RESULT CALLED TO, READ BACK BY AND VERIFIED WITH: SCOTT CHRISTY PHARMD  03/05/21 EB    Staphylococcus aureus (BCID) DETECTED (A) NOT DETECTED Final    Comment: CRITICAL RESULT CALLED TO, READ BACK BY AND VERIFIED WITH: SCOTT CHRISTY PHARMD  03/05/21 EB    Staphylococcus epidermidis NOT DETECTED NOT DETECTED Final   Staphylococcus lugdunensis NOT DETECTED NOT DETECTED Final   Streptococcus species NOT DETECTED NOT DETECTED Final   Streptococcus agalactiae NOT DETECTED NOT DETECTED Final   Streptococcus pneumoniae NOT DETECTED NOT DETECTED Final   Streptococcus pyogenes NOT DETECTED NOT DETECTED Final   A.calcoaceticus-baumannii NOT DETECTED NOT DETECTED Final   Bacteroides fragilis NOT DETECTED NOT DETECTED Final   Enterobacterales NOT DETECTED NOT DETECTED Final   Enterobacter cloacae complex NOT DETECTED NOT DETECTED Final   Escherichia coli NOT DETECTED NOT DETECTED Final   Klebsiella aerogenes NOT DETECTED NOT DETECTED Final   Klebsiella oxytoca NOT DETECTED NOT DETECTED Final   Klebsiella pneumoniae NOT DETECTED NOT DETECTED Final   Proteus species NOT DETECTED NOT DETECTED Final   Salmonella species NOT DETECTED NOT DETECTED Final    Serratia marcescens NOT DETECTED NOT DETECTED Final   Haemophilus influenzae NOT DETECTED NOT DETECTED Final   Neisseria meningitidis NOT DETECTED NOT DETECTED Final   Pseudomonas aeruginosa NOT DETECTED NOT DETECTED Final   Stenotrophomonas maltophilia NOT  DETECTED NOT DETECTED Final   Candida albicans NOT DETECTED NOT DETECTED Final   Candida auris NOT DETECTED NOT DETECTED Final   Candida glabrata NOT DETECTED NOT DETECTED Final   Candida krusei NOT DETECTED NOT DETECTED Final   Candida parapsilosis NOT DETECTED NOT DETECTED Final   Candida tropicalis NOT DETECTED NOT DETECTED Final   Cryptococcus neoformans/gattii NOT DETECTED NOT DETECTED Final   Meth resistant mecA/C and MREJ NOT DETECTED NOT DETECTED Final    Comment: Performed at [facility], [address], [place], [place] [address]  Blood Culture (routine x 2)     Status: Abnormal (Preliminary result)   Collection Time: 03-09-2021  3:33 PM   Specimen: BLOOD  Result Value Ref Range Status   Specimen Description   Final    BLOOD RIGHT ANTECUBITAL Performed at [facility], [address], [place], [place] [address]    Special Requests   Final    BOTTLES DRAWN AEROBIC AND ANAEROBIC Blood Culture results may not be optimal due to an inadequate volume of blood received in culture bottles Performed at [facility], [address] [place], [place] [address]    Culture  Setup Time   Final    GRAM POSITIVE COCCI IN CLUSTERS ANAEROBIC BOTTLE ONLY CRITICAL VALUE NOTED.  VALUE IS CONSISTENT WITH PREVIOUSLY REPORTED AND CALLED VALUE. Performed at [facility], [address] [place], [place] [address]    Culture STAPHYLOCOCCUS AUREUS (A)  Final   Report Status PENDING  Incomplete  MRSA PCR Screening     Status: None   Collection Time: 03/05/21  8:19 AM   Specimen: Nasal Mucosa; Nasopharyngeal  Result Value Ref Range Status   MRSA by PCR NEGATIVE NEGATIVE Final    Comment:         The GeneXpert MRSA Assay (FDA approved for NASAL specimens only), is one component of a comprehensive MRSA colonization surveillance program. It is not intended to diagnose MRSA infection nor to guide or monitor treatment for MRSA infections. Performed at [facility], [address] [place], [place] [address]   Culture, blood (Routine X 2) w Reflex to ID Panel     Status: None (Preliminary result)   Collection Time: 03/05/21  2:22 PM   Specimen: BLOOD  Result Value Ref Range Status   Specimen Description   Final    BLOOD LEFT ANTECUBITAL Performed at [facility], [address] [place], [place] [address]    Special Requests   Final    BOTTLES DRAWN AEROBIC ONLY Blood Culture adequate volume Performed at [facility], [address], [place], [place] [address]    Culture   Final    NO GROWTH < 24 HOURS Performed at [facility], [address], [place], [place] [address]    Report Status PENDING  Incomplete  Culture, blood (Routine X 2) w Reflex to ID Panel     Status: None (Preliminary result)   Collection Time: 03/05/21  2:24 PM   Specimen: BLOOD LEFT HAND  Result Value Ref Range Status   Specimen Description   Final    BLOOD LEFT HAND Performed at [facility], [address] [place], [place] [address]    Special Requests   Final    BOTTLES DRAWN AEROBIC ONLY Blood Culture results may not be optimal due to an inadequate volume of blood received in culture bottles Performed at [facility],  [address], [place], [place] [address]    Culture   Final    NO GROWTH < 24 HOURS Performed at [facility], [address] [place], [place] [address]    Report Status PENDING  Incomplete    Studies/Results: CT ABDOMEN PELVIS WO CONTRAST  Result Date: 02/21/2021 CLINICAL DATA:  Fever with hematuria EXAM: CT ABDOMEN AND PELVIS WITHOUT CONTRAST TECHNIQUE:  Multidetector CT imaging of the abdomen and pelvis was performed following the standard protocol without oral or IV contrast. COMPARISON:  None. FINDINGS: Lower chest: There are small pleural effusions bilaterally. There is scarring with lower lobe bronchiectatic change in the base regions. There are scattered foci of coronary artery calcification. Hepatobiliary: The liver contour is nodular, likely due to a degree of underlying hepatic cirrhosis. No focal liver lesions are appreciable on this noncontrast enhanced study. Gallbladder wall is not appreciably thickened. There is no biliary duct dilatation. Pancreas: There is no appreciable pancreatic mass or inflammatory focus. Spleen: No splenic lesions are evident. There are apparent varices in the splenic region. Adrenals/Urinary Tract: Adrenals bilaterally appear normal. The right kidney appears edematous. There is mild soft tissue stranding in the perinephric fascia on the right. There is an area of increased attenuation in the upper pole the right kidney measuring 5.5 x 5.3 x 5.2 cm. This lesion has an appearance suggesting focal hemorrhage within this area. This lesion may connect with the upper pole collecting system. There is no hydronephrosis on the right. There is no demonstrable mass in the left kidney on this noncontrast enhanced study. No hydronephrosis. There is a 3 x 3 mm calculus in the lower pole the right kidney. There is no appreciable ureteral calculus on either side. Urinary bladder is midline with wall thickness within normal limits. Stomach/Bowel: There is no appreciable bowel wall or mesenteric thickening. No appreciable bowel obstruction. Terminal ileum appears normal. Appendix absent. No free air or portal venous air. Vascular/Lymphatic: No abdominal aortic aneurysm. Foci of calcification noted in each common iliac artery. No adenopathy is evident in the abdomen or pelvis. Subcentimeter inguinal lymph nodes are considered nonspecific.  Reproductive: Occasional prostatic calculi noted. Prostate and seminal vesicles normal in size and contour. Other: There is a focal umbilical region hernia containing fat and mild panniculitis. This hernia at its neck measures 1.4 cm from right to left dimension and 1.5 cm from superior to inferior dimension. No bowel containing hernia is evident. There is no abscess or ascites in the abdomen or pelvis. Musculoskeletal: No blastic or lytic bone lesions. No intramuscular lesions. IMPRESSION: 1. Area of increased attenuation in the upper pole of the right kidney measuring 5.5 x 5.3 x 5.2 cm which has an appearance concerning for localized hemorrhage. Etiology for focal hemorrhage in this area is uncertain. Hemorrhage within a mass or vascular malformation could present in this manner. This finding may warrant CT angiogram for further assessment and characterization. This lesion abuts and may invade a portion of the upper pole collecting system which could account for hematuria. Note that the right kidney appears edematous with mild stranding in the right perinephric fascia. 2. Nonobstructing calculus in the lower pole right kidney measuring 3 x 3 mm. 3. 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 No focal liver lesions evident on noncontrast enhanced study. 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 Spleen is not enlarged. 4. Umbilical hernia containing fat and mild panniculitis. No bowel containing hernia evident. 5. No evident  bowel obstruction. No abscess in the abdomen or pelvis. Appendix absent. 6. Small pleural effusions bilaterally. Areas of scarring and bronchiectasis in the lung bases. 7.  Foci of iliac artery and coronary artery calcification. Electronically Signed   By: Bretta Bang III M.D.   On: 2021-04-02 20:08   DG Wrist Complete Left  Result Date: 03/05/2021 CLINICAL DATA:  Left wrist pain for years. Base of first metacarpal phalangeal joint  pain. EXAM: LEFT WRIST - COMPLETE 3+ VIEW COMPARISON:  None. FINDINGS: No signs of acute fracture or dislocation. There is narrowing of the radiocarpal joint with degenerative changes at the radiocarpal and distal radioulnar joint. Mild degenerative changes are also noted at the basilar joint. IMPRESSION: 1. 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 2. No acute fracture or dislocation. Electronically Signed   By: Signa Kell M.D.   On: 03/05/2021 11:18   MR WRIST LEFT W WO CONTRAST  Result Date: 03/05/2021 CLINICAL DATA:  Acute on chronic left wrist pain.  MSSA bacteremia. EXAM: MR OF THE LEFT WRIST WITHOUT AND WITH CONTRAST TECHNIQUE: Multiplanar multisequence MR imaging of the left wrist was performed both before and after the administration of intravenous contrast. CONTRAST:  6mL GADAVIST GADOBUTROL 1 MMOL/ML IV SOLN COMPARISON:  Left wrist x-rays from same day. FINDINGS: Ligaments: Torn lunotriquetral ligament. Intact scapholunate ligament. Triangular fibrocartilage: Large tear of the articular disc. Tendons: Intact flexor and extensor compartment tendons. Small amount of fluid in the common flexor tendon sheath with mild synovial enhancement. Carpal tunnel/median nerve: Normal carpal tunnel. Normal median nerve. Guyon's canal: Normal. Joint/cartilage: Prominent radiocarpal joint space narrowing with extensive full-thickness cartilage loss over the proximal lunate and adjacent distal radius. Degenerative subchondral marrow edema in the volar distal radius centrally. No significant radiocarpal joint effusion. Small midcarpal and distal radioulnar joint complex effusions with thick synovial enhancement. Additional full-thickness cartilage loss over the ulnar aspect of the lunate with underlying subchondral cystic change. Mild first CMC joint osteoarthritis. Bones/carpal alignment: Positive ulnar variance. Normal carpal alignment. No suspicious bone  lesion. Other: Focal edema and enlargement of the pronator quadratus muscle with small 1.0 x 0.5 x 1.6 cm rim enhancing fluid collection between the muscle and distal radius (series 5, image 10; series 10, image 17). Prominent fascial edema between the volar margin of the pronator quadratus muscle and flexor muscles (series 5, image 1). IMPRESSION: 1. Infectious myofasciitis of the volar wrist involving the pronator quadratus muscle with small 1.6 cm abscess between the muscle and distal radius. 2. Small midcarpal and distal radioulnar joint complex effusions with thick synovial enhancement, concerning for septic arthritis. No significant radiocarpal joint effusion. 3. Mild common flexor tenosynovitis, likely infectious in etiology given proximity to pronator quadratus myofasciitis. 4. 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 Electronically Signed   By: Obie Dredge M.D.   On: 03/05/2021 14:31   ECHOCARDIOGRAM COMPLETE  Result Date: 03/05/2021    ECHOCARDIOGRAM REPORT   Patient Name:   [facility] Date of Exam: 03/05/2021 Medical Rec #:  [id number]       Height:       69.0 in Accession #:    [id number]      Weight:       144.2 lb Date of Birth:  [birth year]       BSA:  1.798 m Patient Age:    54 years        BP:           138/76 mmHg Patient Gender: M               HR:           112 bpm. Exam Location:  Inpatient Procedure: 2D Echo, Cardiac Doppler and Color Doppler Indications:    Bacteremia R78.81  History:        Patient has no prior history of Echocardiogram examinations.  Sonographer:    Elmarie Shiley Dance Referring Phys: [id number] Tinya Cadogan N [place] IMPRESSIONS  1. Left ventricular ejection fraction, by estimation, is 60 to 65%. The left ventricle has normal function. The left  ventricle has no regional wall motion abnormalities. Left ventricular diastolic parameters were normal.  2. Right ventricular systolic function is normal. The right ventricular size is normal. Tricuspid regurgitation signal is inadequate for assessing PA pressure.  3. Left atrial size was moderately dilated.  4. The mitral valve is normal in structure. Trivial mitral valve regurgitation.  5. The aortic valve is tricuspid. Aortic valve regurgitation is not visualized. No aortic stenosis is present.  6. Aortic dilatation noted. There is mild dilatation of the ascending aorta, measuring 38 mm.  7. Possible PFO, not well visualized  8. The inferior vena cava is normal in size with greater than 50% respiratory variability, suggesting right atrial pressure of 3 mmHg. Conclusion(s)/Recommendation(s): No vegetation seen. If high clinical suspicion for endocarditis, consider TEE. FINDINGS  Left Ventricle: Left ventricular ejection fraction, by estimation, is 60 to 65%. The left ventricle has normal function. The left ventricle has no regional wall motion abnormalities. The left ventricular internal cavity size was normal in size. There is  no left ventricular hypertrophy. Left ventricular diastolic parameters were normal. Right Ventricle: The right ventricular size is normal. No increase in right ventricular wall thickness. Right ventricular systolic function is normal. Tricuspid regurgitation signal is inadequate for assessing PA pressure. Left Atrium: Left atrial size was moderately dilated. Right Atrium: Right atrial size was normal in size. Pericardium: There is no evidence of pericardial effusion. Mitral Valve: The mitral valve is normal in structure. Trivial mitral valve regurgitation. Tricuspid Valve: The tricuspid valve is normal in structure. Tricuspid valve regurgitation is trivial. Aortic Valve: The aortic valve is tricuspid. Aortic valve regurgitation is not visualized. No aortic stenosis is present. Pulmonic  Valve: The pulmonic valve was not well visualized. Pulmonic valve regurgitation is not visualized. Aorta: The aortic root is normal in size and structure and aortic dilatation noted. There is mild dilatation of the ascending aorta, measuring 38 mm. Venous: The inferior vena cava is normal in size with greater than 50% respiratory variability, suggesting right atrial pressure of 3 mmHg. IAS/Shunts: Evidence of atrial level shunting detected by color flow Doppler.  LEFT VENTRICLE PLAX 2D LVIDd:         5.00 cm  Diastology LVIDs:         2.80 cm  LV e' medial:    10.10 cm/s LV PW:         1.00 cm  LV E/e' medial:  13.9 LV IVS:        0.80 cm  LV e' lateral:   10.20 cm/s LVOT diam:     2.00 cm  LV E/e' lateral: 13.7 LV SV:         89 LV SV Index:   49 LVOT Area:  3.14 cm  RIGHT VENTRICLE             IVC RV Basal diam:  2.80 cm     IVC diam: 1.70 cm RV S prime:     18.20 cm/s TAPSE (M-mode): 2.4 cm LEFT ATRIUM             Index       RIGHT ATRIUM           Index LA diam:        5.50 cm 3.06 cm/m  RA Area:     13.20 cm LA Vol (A2C):   66.3 ml 36.88 ml/m RA Volume:   30.00 ml  16.69 ml/m LA Vol (A4C):   85.5 ml 47.56 ml/m LA Biplane Vol: 76.3 ml 42.44 ml/m  AORTIC VALVE LVOT Vmax:   153.00 cm/s LVOT Vmean:  107.000 cm/s LVOT VTI:    0.283 m  AORTA Ao Root diam: 3.40 cm Ao Asc diam:  3.80 cm MITRAL VALVE MV Area (PHT): 4.21 cm     SHUNTS MV Decel Time: 180 msec     Systemic VTI:  0.28 m MV E velocity: 140.00 cm/s  Systemic Diam: 2.00 cm MV A velocity: 133.00 cm/s MV E/A ratio:  1.05 Epifanio Lesches MD Electronically signed by Epifanio Lesches MD Signature Date/Time: 03/05/2021/2:38:22 PM    Final    CT Angio Abd/Pel w/ and/or w/o  Result Date: 03/05/2021 CLINICAL DATA:  Right renal hemorrhage, flank pain, hematuria EXAM: CTA ABDOMEN AND PELVIS WITH CONTRAST TECHNIQUE: Multidetector CT imaging of the abdomen and pelvis was performed using the standard protocol during bolus administration of intravenous  contrast. Multiplanar reconstructed images and MIPs were obtained and reviewed to evaluate the vascular anatomy. CONTRAST:  OMNIPAQUE IOHEXOL 350 MG/ML SOLN COMPARISON:  The previous day's study FINDINGS: VASCULAR Aorta: Normal caliber aorta without aneurysm, dissection, vasculitis or significant stenosis. Celiac: Partially calcified ostial plaque with short-segment stenosis of only mild severity, patent and unremarkable distally. SMA: Patent without evidence of aneurysm, dissection, vasculitis or significant stenosis. Replaced right hepatic arterial supply, an anatomic variant. Renals: Single right, widely patent. No pseudoaneurysm, active extravasation, or other vascular lesion evident. Single left, with calcified ostial plaque, no high-grade stenosis, unremarkable distally. IMA: Patent without evidence of aneurysm, dissection, vasculitis or significant stenosis. Inflow: Calcified plaque at the aortic bifurcation without aneurysm or stenosis, unremarkable distally. Proximal Outflow: Mildly atheromatous, patent. Veins: Prominent left retroperitoneal splenorenal shunt. Small enhancing gastric varices are evident on venous phase. Portal vein patent. SMV patent. Splenic vein patent. Hepatic veins patent. Review of the MIP images confirms the above findings. NON-VASCULAR Lower chest: Dependent atelectasis/consolidation in the lung bases. No pleural or pericardial effusion. Hepatobiliary: Nodular liver contour. No focal lesion or biliary ductal dilatation. Gallbladder unremarkable. Pancreas: Unremarkable. No pancreatic ductal dilatation or surrounding inflammatory changes. Spleen: 11.2 cm maximum diameter.  No focal lesion. Adrenals/Urinary Tract: Adrenal glands unremarkable. Left kidney unremarkable. 6.3 cm right upper pole renal intraparenchymal hematoma (previously 5.9). No active arterial extravasation. No aneurysm or other evident etiology of the collection. 8 mm right lower pole collecting system calculus.  No hydronephrosis. Urinary bladder physiologically distended. Stomach/Bowel: Stomach, small bowel, and colon are decompressed. Lymphatic: No abdominal or pelvic adenopathy. Reproductive: Mild prostate enlargement with central coarse calcifications Other: No ascites.  No free air. Musculoskeletal: Moderate paraumbilical hernia containing only mesenteric fat. Regional bones unremarkable. IMPRESSION: 1. Minimal enlargement of right upper pole intraparenchymal renal hematoma, with no active extravasation, pseudoaneurysm, or other evident etiology.  After resolution of hematoma, consider elective renal MR with contrast to exclude underlying lesion. 2. Nodular hepatic contour suggesting cirrhosis, with small esophageal varices and a large decompressive splenorenal venous shunt. 3. Right nephrolithiasis without hydronephrosis. Electronically Signed   By: Corlis Leak M.D.   On: 03/05/2021 14:38      Assessment/Plan:  INTERVAL HISTORY:   MRI c/w myofasciitis volar wrist, with abscess, midcarpal and radioulnar joint effusions with thick synovial enhancement, flexor tenosynovitis  BOTH cultures now + for MSSA    Principal Problem:   MSSA bacteremia Active Problems:   Renal hemorrhage, right   SIRS (systemic inflammatory response syndrome) (HCC)   Cirrhosis of liver (HCC)   ABLA (acute blood loss anemia)   AKI (acute kidney injury) (HCC)    Greg Bean is a 55 y.o. male with prior ETOH abuse (clean per his account as well as his mother's) prior COVID with hematuria, confusion admitted to ICU. He was found on CT to have what appears to be renal hemorrhage. He has been found to have MSSA bacteremia and I have high suspicion for endocarditis and that renal hemorrhage is due to septic embolism He also has an abscess in his left wrist and evidence of septic arthritis. Dr. Izora Ribas from [facility] has been consulted  He had ARF (improving) and coagulaopathy, TTpneia (improving)  TTE does not  show vegetations   continue cefazolin Consider TEE but would treat for IE regardless and I cannot fathom him being a likely surgical candidate should he need CT surgery based on TEE  I DO think his wrist is going to need attention to gain control of infection there  I am skeptical that an aspirate of this site will help with source control The orgnanism is going to be MSSA.   He is going to need protracted therapy  I spent greater than 35  minutes with the patient including greater than 50% of time in face to face counsel of the patient and his mother and in personal review of radiographs, laboratory data, microbiolgical data  and in coordination of his care.  Dr. Luciana Axe is back tomorrow.     LOS: 2 days   Acey Lav 03/06/2021, 6:11 PM

## 2021-03-06 NOTE — Consult Note (Signed)
IR requested to evaluate patient for left forearm abscess and complex effusion in left wrist. Imaging has been reviewed and the left forearm abscess is too small to drain. The fluid in the wrist may be amenable to percutaneous aspiration and this request has been sent to the diagnostic radiology team.   Dr. Alanda Slim made aware. No IR intervention planned.  Please contact IR with any questions.  Greg Bean, [place] [phone] 03/06/2021, 9:40 AM

## 2021-03-06 NOTE — Progress Notes (Signed)
PROGRESS NOTE  Lemoyne Nestor JYN:[id number] DOB: 04-Dec-1965   PCP: Pcp, No  Patient is from: Home  DOA: 03/20/2021 LOS: 2  Chief complaints: Hematuria, confusion and flank pain  Brief Narrative / Interim history: 55 year old M with no PMH other than previous alcohol abuse and COVID-19 illness presenting with hematuria, flank pain, weight loss and confusion, and found to have SIRS, liver cirrhosis with splenic varices, coagulopathy, hyperbilirubinemia, ABLA and AKI.   Patient received 3 units of FFP, 10 mg of IV vitamin K and 1u pRBCs.  Started on IV ceftriaxone for SIRS, and IVF for AKI.  GI and urology consulted.   The next day, blood culture with MSSA in 3 out of 4 bottles.  MRI left wrist/forearm with small abscess and complex joint effusion concerning for septic arthritis.  Infectious disease and hand surgery consulted.  Antibiotics de-escalated to Ancef by ID.  TTE without significant finding.  On surgery recommended IR consult for tapping wrist joint  Subjective: Seen and examined earlier this morning.  No major events overnight of this morning.  Somewhat tachycardic and tachypneic but no chest pain or shortness of breath.  Continues to endorse left wrist pain.  He denies back pain, abdominal pain, nausea or vomiting.  Denies UTI symptoms.  Objective: Vitals:   03/06/21 0600 03/06/21 0800 03/06/21 0900 03/06/21 1000  BP: 131/77 135/76 137/81   Pulse: 100 99 98 (!) 102  Resp: (!) 30 17 (!) 25 (!) 26  Temp:  98.2 F (36.8 C)    TempSrc:  Oral    SpO2: 99% 97% 99% 97%  Weight:      Height:        Intake/Output Summary (Last 24 hours) at 03/06/2021 1102 Last data filed at 03/06/2021 0851 Gross per 24 hour  Intake 1384.97 ml  Output 2450 ml  Net -1065.03 ml   Filed Weights   03/12/2021 1451 03/05/21 0040  Weight: 60.3 kg 65.4 kg    Examination:  GENERAL: No apparent distress.  Nontoxic. HEENT: MMM.  Vision and hearing grossly intact.  Sclera anicteric. NECK: Supple.   No apparent JVD.  RESP: 97% on 4 L.  No IWOB.  Crackles bilaterally. CVS:  RRR. Heart sounds normal.  ABD/GI/GU: BS+. Abd soft, NTND.  MSK/EXT:  Moves extremities.  TTP left wrist.  Limited ROM.  No apparent swelling or erythema. SKIN: no apparent skin lesion or wound NEURO: Awake and alert. Oriented appropriately.  No apparent focal neuro deficit. PSYCH: Calm. Normal affect. Procedures:  None  Microbiology summarized: [id number] and influenza PCR nonreactive. Blood cultures with MSSA.  Assessment & Plan: Severe sepsis due to MSSA bacteremia/ left wrist septic arthritis/small left pronator abscess-POA.  Met criteria with fever, tachycardia, leukocytosis and multiorgan failure including coagulopathy, hyperbilirubinemia, encephalopathy and lactic acidosis.  Blood cultures with MSSA.  TTE without significant finding.  MRI wrist with complex effusion raising concern for septic arthritis and a small abscess. -ID following. -Ceftriaxone>> Ancef -Hand surgery recommended IR, who thinks abscess is small and recommended fluoroscopy guided aspiration of wrist joint -Cancelled MRI thoracic spine.  Patient denies back pain.  Has no back tenderness either.  Acute hypoxic respiratory failure: Desaturated to mid 39s yesterday.  Tachypneic to 20s.  Requiring 4 L.  Bilateral crackles on exam.  Appears dry on exam. -Obtain 2 view chest x-ray -Incentive spirometry  Renal hemorrhage/hematuria: CT abdomen and pelvis w/o contrast showed 5 cm enhancing right renal lesion suggestive for hematoma.  CT abdomen and pelvis with contrast with minimal  enlargement of right upper pole parenchyma renal hematoma with no active extravasation or pseudoaneurysm.  Also liver cirrhosis with small esophageal varices and large decompressive splenorenal venous shunt -Urology recommended repeat imaging in 4 to 6 weeks to reassess as H&H is stable. -Treat coagulopathy as below  Liver cirrhosis with splenic varices-improved.  Likely  from alcohol.  Reportedly quit drinking about 6 months ago.  Acute hepatitis panel negative.  Low suspicion for SBP. -Appreciate input by GI -Follow speciality labs  Elevated LFT/hyperbilirubinemia-improving. Recent Labs  Lab 03/03/2021 1619 03/15/2021 2127 03/05/21 0824 03/06/21 0329  AST 76* 71* 64* 59*  ALT 41 40 37 32  ALKPHOS 81 76 85 85  BILITOT 5.1* 4.7* 6.5* 5.2*  PROT 8.3* 7.3 8.0 7.7  ALBUMIN 1.6* 1.4* 1.8* 1.6*  -Management as above  ABLA due to hematuria.  He could have underlying anemia of chronic disease Recent Labs    03/07/2021 1530 03/05/21 0824 03/06/21 0329  HGB 7.8* 8.6* 8.3*  -H&H stable after 1 unit. -Continue monitoring  Coagulopathy: INR 2.2.  Coagulopathy could be due to cirrhosis and sepsis -Received 3 units of FFP and IV vitamin K 10 mg x 1 on admission -IV vitamin K 5 mg x 1 -Recheck in the morning  Hepatic encephalopathy?-Oriented x4 but slow.  Ammonia 48> 47. -Start p.o. lactulose 20 g 3 times daily-titrate for 2-3 bowel movements a day  AKI/azotemia: Resolved. Recent Labs    03/12/2021 1619 03/05/21 0824 03/06/21 0329  BUN 41* 38* 31*  CREATININE 1.50* 1.22 1.07  -Continue gentle hydration  Leukocytosis/bandemia: Likely due to #1.  Improving. -Continue trending.  Thrombocytopenia: Likely due to acute illness and liver cirrhosis.  Improving. Recent Labs  Lab 02/22/2021 1530 03/05/21 0824 03/06/21 0329  PLT 62* 97* 103*  -Continue monitoring  Debility/physical deconditioning -PT/OT  Body mass index is 21.29 kg/m.         DVT prophylaxis:  SCDs Start: 03/01/2021 2147  Code Status: Full code Family Communication: Patient and/or RN.  [name] did not answer phone call on 5/14 and 5/15. Level of care: Stepdown Status is: Inpatient  Remains inpatient appropriate because:Hemodynamically unstable, Ongoing active pain requiring inpatient pain management, Ongoing diagnostic testing needed not appropriate for outpatient work  up, Unsafe d/c plan, IV treatments appropriate due to intensity of illness or inability to take PO and Inpatient level of care appropriate due to severity of illness   Dispo: The patient is from: Home              Anticipated d/c is to: Home              Patient currently is not medically stable to d/c.   Difficult to place patient No       Consultants:  Infectious disease Urology Gastroenterology Heart surgery Interventional radiology   Sch Meds:  Scheduled Meds: . Chlorhexidine Gluconate Cloth  6 each Topical Daily  . mouth rinse  15 mL Mouth Rinse BID   Continuous Infusions: .  ceFAZolin (ANCEF) IV Stopped (03/06/21 0543)  . dextrose 5% lactated ringers Stopped (03/06/21 0835)   PRN Meds:.fentaNYL (SUBLIMAZE) injection, ondansetron **OR** ondansetron (ZOFRAN) IV  Antimicrobials: Anti-infectives (From admission, onward)   Start     Dose/Rate Route Frequency Ordered Stop   03/05/21 1030  nafcillin injection 2 g  Status:  Discontinued        2 g Intravenous Every 4 hours 03/05/21 0917 03/05/21 0937   03/05/21 1030  ceFAZolin (ANCEF) IVPB 2g/100 mL premix  Status:  Discontinued        2 g 200 mL/hr over 30 Minutes Intravenous Every 8 hours 03/05/21 0938 03/05/21 0941   03/05/21 1030  ceFAZolin (ANCEF) IVPB 2g/100 mL premix        2 g 200 mL/hr over 30 Minutes Intravenous Every 8 hours 03/05/21 0950     03/06/2021 1530  cefTRIAXone (ROCEPHIN) 1 g in sodium chloride 0.9 % 100 mL IVPB  Status:  Discontinued        1 g 200 mL/hr over 30 Minutes Intravenous Every 24 hours 03/01/2021 1529 03/05/21 0915       I have personally reviewed the following labs and images: CBC: Recent Labs  Lab 03/20/2021 1530 03/05/21 0824 03/06/21 0329  WBC 18.9* 19.0* 16.3*  NEUTROABS 16.2*  --   --   HGB 7.8* 8.6* 8.3*  HCT 23.2* 25.0* 24.7*  MCV 91.0 89.9 91.5  PLT 62* 97* 103*   BMP &GFR Recent Labs  Lab 02/24/2021 1619 03/05/21 0824 03/06/21 0329  NA 126* 133* 133*  K 3.9 3.8  3.7  CL 95* 101 103  CO2 22 24 21*  GLUCOSE 108* 101* 122*  BUN 41* 38* 31*  CREATININE 1.50* 1.22 1.07  CALCIUM 7.7* 8.2* 8.0*  MG 2.0  --  1.9  PHOS  --   --  3.0   Estimated Creatinine Clearance: 72.2 mL/min (by C-G formula based on SCr of 1.07 mg/dL). Liver & Pancreas: Recent Labs  Lab 02/23/2021 1619 02/22/2021 2127 03/05/21 0824 03/06/21 0329  AST 76* 71* 64* 59*  ALT 41 40 37 32  ALKPHOS 81 76 85 85  BILITOT 5.1* 4.7* 6.5* 5.2*  PROT 8.3* 7.3 8.0 7.7  ALBUMIN 1.6* 1.4* 1.8* 1.6*   Recent Labs  Lab 03/19/2021 1619  LIPASE 47   Recent Labs  Lab 03/05/2021 2127 03/06/21 0329  AMMONIA 48* 47*   Diabetic: No results for input(s): HGBA1C in the last 72 hours. No results for input(s): GLUCAP in the last 168 hours. Cardiac Enzymes: No results for input(s): CKTOTAL, CKMB, CKMBINDEX, TROPONINI in the last 168 hours. No results for input(s): PROBNP in the last 8760 hours. Coagulation Profile: Recent Labs  Lab 03/21/2021 1530 03/05/21 0824 03/06/21 0329  INR 2.7* 1.9* 2.2*   Thyroid Function Tests: No results for input(s): TSH, T4TOTAL, FREET4, T3FREE, THYROIDAB in the last 72 hours. Lipid Profile: No results for input(s): CHOL, HDL, LDLCALC, TRIG, CHOLHDL, LDLDIRECT in the last 72 hours. Anemia Panel: Recent Labs    03/03/2021 1530 03/20/2021 1902 03/05/21 0824  VITAMINB12  --   --  2,357*  FOLATE  --  11.0  --   FERRITIN  --   --  1,805*  TIBC  --   --  133*  IRON  --   --  109  RETICCTPCT 2.5  --   --    Urine analysis:    Component Value Date/Time   COLORURINE RED (A) 03/16/2021 1528   APPEARANCEUR HAZY (A) 03/15/2021 1528   LABSPEC 1.011 03/16/2021 1528   PHURINE 6.0 03/21/2021 1528   GLUCOSEU 50 (A) 02/27/2021 1528   HGBUR MODERATE (A) 03/06/2021 1528   BILIRUBINUR NEGATIVE 03/11/2021 [place] 03/17/2021 1528   PROTEINUR 100 (A) 03/09/2021 1528   NITRITE NEGATIVE 03/03/2021 [place] 03/07/2021 1528   Sepsis  Labs: Invalid input(s): PROCALCITONIN, [place]  Microbiology: Recent Results (from the past 240 hour(s))  Resp Panel by RT-PCR (Flu A&B, Covid) Nasopharyngeal  Swab     Status: None   Collection Time: 03/12/2021  3:28 PM   Specimen: Nasopharyngeal Swab; Nasopharyngeal(NP) swabs in vial transport medium  Result Value Ref Range Status   SARS Coronavirus 2 by RT PCR NEGATIVE NEGATIVE Final    Comment: (NOTE) SARS-CoV-2 target nucleic acids are NOT DETECTED.  The SARS-CoV-2 RNA is generally detectable in upper respiratory specimens during the acute phase of infection. The lowest concentration of SARS-CoV-2 viral copies this assay can detect is 138 copies/mL. A negative result does not preclude SARS-Cov-2 infection and should not be used as the sole basis for treatment or other patient management decisions. A negative result may occur with  improper specimen collection/handling, submission of specimen other than nasopharyngeal swab, presence of viral mutation(s) within the areas targeted by this assay, and inadequate number of viral copies(<138 copies/mL). A negative result must be combined with clinical observations, patient history, and epidemiological information. The expected result is Negative.  Fact Sheet for Patients:  EntrepreneurPulse.com.au  Fact Sheet for Healthcare Providers:  IncredibleEmployment.be  This test is no t yet approved or cleared by the [place] FDA and  has been authorized for detection and/or diagnosis of SARS-CoV-2 by FDA under an Emergency Use Authorization (EUA). This EUA will remain  in effect (meaning this test can be used) for the duration of the COVID-19 declaration under Section 564(b)(1) of the Act, 21 U.S.C.section 360bbb-3(b)(1), unless the authorization is terminated  or revoked sooner.       Influenza A by PCR NEGATIVE NEGATIVE Final   Influenza B by PCR NEGATIVE NEGATIVE Final    Comment:  (NOTE) The Xpert Xpress SARS-CoV-2/FLU/RSV plus assay is intended as an aid in the diagnosis of influenza from Nasopharyngeal swab specimens and should not be used as a sole basis for treatment. Nasal washings and aspirates are unacceptable for Xpert Xpress SARS-CoV-2/FLU/RSV testing.  Fact Sheet for Patients: EntrepreneurPulse.com.au  Fact Sheet for Healthcare Providers: IncredibleEmployment.be  This test is not yet approved or cleared by the [place] FDA and has been authorized for detection and/or diagnosis of SARS-CoV-2 by FDA under an Emergency Use Authorization (EUA). This EUA will remain in effect (meaning this test can be used) for the duration of the COVID-19 declaration under Section 564(b)(1) of the Act, 21 U.S.C. section 360bbb-3(b)(1), unless the authorization is terminated or revoked.  Performed at [facility], [place] [address] [place], [place] [address]   Urine culture     Status: None (Preliminary result)   Collection Time: 03/19/2021  3:28 PM   Specimen: In/Out Cath Urine  Result Value Ref Range Status   Specimen Description   Final    IN/OUT CATH URINE Performed at [facility] [address], [place], [place] [address]    Special Requests   Final    NONE Performed at [facility], [place] [address] [place], [place] [address]    Culture   Final    CULTURE REINCUBATED FOR BETTER GROWTH Performed at [facility], [place] [address] [place], [place] [address]    Report Status PENDING  Incomplete  Blood Culture (routine x 2)     Status: Abnormal (Preliminary result)   Collection Time: 03/18/2021  3:30 PM   Specimen: BLOOD  Result Value Ref Range Status   Specimen Description   Final    BLOOD LEFT ARM Performed at [facility] [address], [place], [place] [address]    Special Requests  Final    BOTTLES DRAWN AEROBIC AND ANAEROBIC Blood  Culture results may not be optimal due to an inadequate volume of blood received in culture bottles Performed at [facility], [place] [address] [place], [place] [address]    Culture  Setup Time   Final    GRAM POSITIVE COCCI IN CLUSTERS IN BOTH AEROBIC AND ANAEROBIC BOTTLES CRITICAL RESULT CALLED TO, READ BACK BY AND VERIFIED WITH: SCOTT CHRISTY PHARMD '@0850'  03/05/21 EB    Culture (A)  Final    STAPHYLOCOCCUS AUREUS SUSCEPTIBILITIES TO FOLLOW Performed at [facility], [place] [address], [place], [place] [address]    Report Status PENDING  Incomplete  Blood Culture ID Panel (Reflexed)     Status: Abnormal   Collection Time: 03/19/2021  3:30 PM  Result Value Ref Range Status   Enterococcus faecalis NOT DETECTED NOT DETECTED Final   Enterococcus Faecium NOT DETECTED NOT DETECTED Final   Listeria monocytogenes NOT DETECTED NOT DETECTED Final   Staphylococcus species DETECTED (A) NOT DETECTED Final    Comment: CRITICAL RESULT CALLED TO, READ BACK BY AND VERIFIED WITH: SCOTT CHRISTY PHARMD '@0850'  03/05/21 EB    Staphylococcus aureus (BCID) DETECTED (A) NOT DETECTED Final    Comment: CRITICAL RESULT CALLED TO, READ BACK BY AND VERIFIED WITH: SCOTT CHRISTY PHARMD '@0850'  03/05/21 EB    Staphylococcus epidermidis NOT DETECTED NOT DETECTED Final   Staphylococcus lugdunensis NOT DETECTED NOT DETECTED Final   Streptococcus species NOT DETECTED NOT DETECTED Final   Streptococcus agalactiae NOT DETECTED NOT DETECTED Final   Streptococcus pneumoniae NOT DETECTED NOT DETECTED Final   Streptococcus pyogenes NOT DETECTED NOT DETECTED Final   A.calcoaceticus-baumannii NOT DETECTED NOT DETECTED Final   Bacteroides fragilis NOT DETECTED NOT DETECTED Final   Enterobacterales NOT DETECTED NOT DETECTED Final   Enterobacter cloacae complex NOT DETECTED NOT DETECTED Final   Escherichia coli NOT DETECTED NOT DETECTED Final   Klebsiella aerogenes NOT DETECTED NOT DETECTED Final    Klebsiella oxytoca NOT DETECTED NOT DETECTED Final   Klebsiella pneumoniae NOT DETECTED NOT DETECTED Final   Proteus species NOT DETECTED NOT DETECTED Final   Salmonella species NOT DETECTED NOT DETECTED Final   Serratia marcescens NOT DETECTED NOT DETECTED Final   Haemophilus influenzae NOT DETECTED NOT DETECTED Final   Neisseria meningitidis NOT DETECTED NOT DETECTED Final   Pseudomonas aeruginosa NOT DETECTED NOT DETECTED Final   Stenotrophomonas maltophilia NOT DETECTED NOT DETECTED Final   Candida albicans NOT DETECTED NOT DETECTED Final   Candida auris NOT DETECTED NOT DETECTED Final   Candida glabrata NOT DETECTED NOT DETECTED Final   Candida krusei NOT DETECTED NOT DETECTED Final   Candida parapsilosis NOT DETECTED NOT DETECTED Final   Candida tropicalis NOT DETECTED NOT DETECTED Final   Cryptococcus neoformans/gattii NOT DETECTED NOT DETECTED Final   Meth resistant mecA/C and MREJ NOT DETECTED NOT DETECTED Final    Comment: Performed at [facility], [address] [place], [place] [address]  Blood Culture (routine x 2)     Status: Abnormal (Preliminary result)   Collection Time: 03/14/2021  3:33 PM   Specimen: BLOOD  Result Value Ref Range Status   Specimen Description   Final    BLOOD RIGHT ANTECUBITAL Performed at [place] [address], [place], [place] [address]    Special Requests   Final    BOTTLES DRAWN AEROBIC AND ANAEROBIC Blood Culture results may not be optimal due to an inadequate volume of blood  received in culture bottles Performed at [facility], [place] [address], [place], [place] [address]    Culture  Setup Time   Final    GRAM POSITIVE COCCI IN CLUSTERS ANAEROBIC BOTTLE ONLY CRITICAL VALUE NOTED.  VALUE IS CONSISTENT WITH PREVIOUSLY REPORTED AND CALLED VALUE. Performed at [facility], [place] [address] [place], [place] [address]    Culture STAPHYLOCOCCUS AUREUS (A)  Final   Report Status PENDING   Incomplete  MRSA PCR Screening     Status: None   Collection Time: 03/05/21  8:19 AM   Specimen: Nasal Mucosa; Nasopharyngeal  Result Value Ref Range Status   MRSA by PCR NEGATIVE NEGATIVE Final    Comment:        The GeneXpert MRSA Assay (FDA approved for NASAL specimens only), is one component of a comprehensive MRSA colonization surveillance program. It is not intended to diagnose MRSA infection nor to guide or monitor treatment for MRSA infections. Performed at [facility], [place] [address] [place], [place] [address]   Culture, blood (Routine X 2) w Reflex to ID Panel     Status: None (Preliminary result)   Collection Time: 03/05/21  2:22 PM   Specimen: BLOOD  Result Value Ref Range Status   Specimen Description   Final    BLOOD LEFT ANTECUBITAL Performed at [facility] [address] [place], [place] [address]    Special Requests   Final    BOTTLES DRAWN AEROBIC ONLY Blood Culture adequate volume Performed at [facility] [address], [place], [place] [address]    Culture   Final    NO GROWTH < 24 HOURS Performed at [facility] [address], [place], [place] [address]    Report Status PENDING  Incomplete  Culture, blood (Routine X 2) w Reflex to ID Panel     Status: None (Preliminary result)   Collection Time: 03/05/21  2:24 PM   Specimen: BLOOD LEFT HAND  Result Value Ref Range Status   Specimen Description   Final    BLOOD LEFT HAND Performed at [facility] [address] [place], [place] [address]    Special Requests   Final    BOTTLES DRAWN AEROBIC ONLY Blood Culture results may not be optimal due to an inadequate volume of blood received in culture bottles Performed at [facility] [address] [place], [place] [address]    Culture   Final    NO GROWTH < 24 HOURS Performed at [facility] [address], [place], [place] [address]     Report Status PENDING  Incomplete    Radiology Studies: MR WRIST LEFT W WO CONTRAST  Result Date: 03/05/2021 CLINICAL DATA:  Acute on chronic left wrist pain.  MSSA bacteremia. EXAM: MR OF THE LEFT WRIST WITHOUT AND WITH CONTRAST TECHNIQUE: Multiplanar multisequence MR imaging of the left wrist was performed both before and after the administration of intravenous contrast. CONTRAST:  71m GADAVIST GADOBUTROL 1 MMOL/ML IV SOLN COMPARISON:  Left wrist x-rays from same day. FINDINGS: Ligaments: Torn lunotriquetral ligament. Intact scapholunate ligament. Triangular fibrocartilage: Large tear of the articular disc. Tendons: Intact flexor and extensor compartment tendons. Small amount of fluid in the common flexor tendon sheath with mild synovial enhancement. Carpal tunnel/median nerve: Normal carpal tunnel. Normal median nerve. Guyon's canal: Normal. Joint/cartilage: Prominent radiocarpal joint space narrowing with extensive full-thickness cartilage loss over the proximal lunate and adjacent  distal radius. Degenerative subchondral marrow edema in the volar distal radius centrally. No significant radiocarpal joint effusion. Small midcarpal and distal radioulnar joint complex effusions with thick synovial enhancement. Additional full-thickness cartilage loss over the ulnar aspect of the lunate with underlying subchondral cystic change. Mild first CMC joint osteoarthritis. Bones/carpal alignment: Positive ulnar variance. Normal carpal alignment. No suspicious bone lesion. Other: Focal edema and enlargement of the pronator quadratus muscle with small 1.0 x 0.5 x 1.6 cm rim enhancing fluid collection between the muscle and distal radius (series 5, image 10; series 10, image 17). Prominent fascial edema between the volar margin of the pronator quadratus muscle and flexor muscles (series 5, image 1). IMPRESSION: 1. Infectious myofasciitis of the volar wrist involving the pronator quadratus muscle with small 1.6 cm  abscess between the muscle and distal radius. 2. Small midcarpal and distal radioulnar joint complex effusions with thick synovial enhancement, concerning for septic arthritis. No significant radiocarpal joint effusion. 3. Mild common flexor tenosynovitis, likely infectious in etiology given proximity to pronator quadratus myofasciitis. 4. 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 Electronically Signed   By: Titus Dubin M.D.   On: 03/05/2021 14:31   ECHOCARDIOGRAM COMPLETE  Result Date: 03/05/2021    ECHOCARDIOGRAM REPORT   Patient Name:   [facility] Date of Exam: 03/05/2021 Medical Rec #:  [id number]       Height:       69.0 in Accession #:    [id number]      Weight:       144.2 lb Date of Birth:  Jun 02, 1966       BSA:          1.798 m Patient Age:    24 years        BP:           138/76 mmHg Patient Gender: M               HR:           112 bpm. Exam Location:  Inpatient Procedure: 2D Echo, Cardiac Doppler and Color Doppler Indications:    Bacteremia R78.81  History:        Patient has no prior history of Echocardiogram examinations.  Sonographer:    Jonelle Sidle Dance Referring Phys: [place]  1. Left ventricular ejection fraction, by estimation, is 60 to 65%. The left ventricle has normal function. The left ventricle has no regional wall motion abnormalities. Left ventricular diastolic parameters were normal.  2. Right ventricular systolic function is normal. The right ventricular size is normal. Tricuspid regurgitation signal is inadequate for assessing PA pressure.  3. Left atrial size was moderately dilated.  4. The mitral valve is normal in structure. Trivial mitral valve regurgitation.  5. The aortic valve is tricuspid. Aortic valve regurgitation is not  visualized. No aortic stenosis is present.  6. Aortic dilatation noted. There is mild dilatation of the ascending aorta, measuring 38 mm.  7. Possible PFO, not well visualized  8. The inferior vena cava is normal in size with greater than 50% respiratory variability, suggesting right atrial pressure of 3 mmHg. Conclusion(s)/Recommendation(s): No vegetation seen. If high clinical suspicion for endocarditis, consider TEE. FINDINGS  Left Ventricle: Left ventricular ejection fraction, by estimation, is 60 to 65%. The left ventricle has normal function. The left ventricle  has no regional wall motion abnormalities. The left ventricular internal cavity size was normal in size. There is  no left ventricular hypertrophy. Left ventricular diastolic parameters were normal. Right Ventricle: The right ventricular size is normal. No increase in right ventricular wall thickness. Right ventricular systolic function is normal. Tricuspid regurgitation signal is inadequate for assessing PA pressure. Left Atrium: Left atrial size was moderately dilated. Right Atrium: Right atrial size was normal in size. Pericardium: There is no evidence of pericardial effusion. Mitral Valve: The mitral valve is normal in structure. Trivial mitral valve regurgitation. Tricuspid Valve: The tricuspid valve is normal in structure. Tricuspid valve regurgitation is trivial. Aortic Valve: The aortic valve is tricuspid. Aortic valve regurgitation is not visualized. No aortic stenosis is present. Pulmonic Valve: The pulmonic valve was not well visualized. Pulmonic valve regurgitation is not visualized. Aorta: The aortic root is normal in size and structure and aortic dilatation noted. There is mild dilatation of the ascending aorta, measuring 38 mm. Venous: The inferior vena cava is normal in size with greater than 50% respiratory variability, suggesting right atrial pressure of 3 mmHg. IAS/Shunts: Evidence of atrial level shunting detected by color flow  Doppler.  LEFT VENTRICLE PLAX 2D LVIDd:         5.00 cm  Diastology LVIDs:         2.80 cm  LV e' medial:    10.10 cm/s LV PW:         1.00 cm  LV E/e' medial:  13.9 LV IVS:        0.80 cm  LV e' lateral:   10.20 cm/s LVOT diam:     2.00 cm  LV E/e' lateral: 13.7 LV SV:         89 LV SV Index:   49 LVOT Area:     3.14 cm  RIGHT VENTRICLE             IVC RV Basal diam:  2.80 cm     IVC diam: 1.70 cm RV S prime:     18.20 cm/s TAPSE (M-mode): 2.4 cm LEFT ATRIUM             Index       RIGHT ATRIUM           Index LA diam:        5.50 cm 3.06 cm/m  RA Area:     13.20 cm LA Vol (A2C):   66.3 ml 36.88 ml/m RA Volume:   30.00 ml  16.69 ml/m LA Vol (A4C):   85.5 ml 47.56 ml/m LA Biplane Vol: 76.3 ml 42.44 ml/m  AORTIC VALVE LVOT Vmax:   153.00 cm/s LVOT Vmean:  107.000 cm/s LVOT VTI:    0.283 m  AORTA Ao Root diam: 3.40 cm Ao Asc diam:  3.80 cm MITRAL VALVE MV Area (PHT): 4.21 cm     SHUNTS MV Decel Time: 180 msec     Systemic VTI:  0.28 m MV E velocity: 140.00 cm/s  Systemic Diam: 2.00 cm MV A velocity: 133.00 cm/s MV E/A ratio:  1.05 Oswaldo Milian MD Electronically signed by Oswaldo Milian MD Signature Date/Time: 03/05/2021/2:38:22 PM    Final    CT Angio Abd/Pel w/ and/or w/o  Result Date: 03/05/2021 CLINICAL DATA:  Right renal hemorrhage, flank pain, hematuria EXAM: CTA ABDOMEN AND PELVIS WITH CONTRAST TECHNIQUE: Multidetector CT imaging of the abdomen and pelvis was performed using the standard protocol during bolus administration of intravenous contrast. Multiplanar reconstructed images and  MIPs were obtained and reviewed to evaluate the vascular anatomy. CONTRAST:  120m OMNIPAQUE IOHEXOL 350 MG/ML SOLN COMPARISON:  The previous day's study FINDINGS: VASCULAR Aorta: Normal caliber aorta without aneurysm, dissection, vasculitis or significant stenosis. Celiac: Partially calcified ostial plaque with short-segment stenosis of only mild severity, patent and unremarkable distally. SMA: Patent  without evidence of aneurysm, dissection, vasculitis or significant stenosis. Replaced right hepatic arterial supply, an anatomic variant. Renals: Single right, widely patent. No pseudoaneurysm, active extravasation, or other vascular lesion evident. Single left, with calcified ostial plaque, no high-grade stenosis, unremarkable distally. IMA: Patent without evidence of aneurysm, dissection, vasculitis or significant stenosis. Inflow: Calcified plaque at the aortic bifurcation without aneurysm or stenosis, unremarkable distally. Proximal Outflow: Mildly atheromatous, patent. Veins: Prominent left retroperitoneal splenorenal shunt. Small enhancing gastric varices are evident on venous phase. Portal vein patent. SMV patent. Splenic vein patent. Hepatic veins patent. Review of the MIP images confirms the above findings. NON-VASCULAR Lower chest: Dependent atelectasis/consolidation in the lung bases. No pleural or pericardial effusion. Hepatobiliary: Nodular liver contour. No focal lesion or biliary ductal dilatation. Gallbladder unremarkable. Pancreas: Unremarkable. No pancreatic ductal dilatation or surrounding inflammatory changes. Spleen: 11.2 cm maximum diameter.  No focal lesion. Adrenals/Urinary Tract: Adrenal glands unremarkable. Left kidney unremarkable. 6.3 cm right upper pole renal intraparenchymal hematoma (previously 5.9). No active arterial extravasation. No aneurysm or other evident etiology of the collection. 8 mm right lower pole collecting system calculus. No hydronephrosis. Urinary bladder physiologically distended. Stomach/Bowel: Stomach, small bowel, and colon are decompressed. Lymphatic: No abdominal or pelvic adenopathy. Reproductive: Mild prostate enlargement with central coarse calcifications Other: No ascites.  No free air. Musculoskeletal: Moderate paraumbilical hernia containing only mesenteric fat. Regional bones unremarkable. IMPRESSION: 1. Minimal enlargement of right upper pole  intraparenchymal renal hematoma, with no active extravasation, pseudoaneurysm, or other evident etiology. After resolution of hematoma, consider elective renal MR with contrast to exclude underlying lesion. 2. Nodular hepatic contour suggesting cirrhosis, with small esophageal varices and a large decompressive splenorenal venous shunt. 3. Right nephrolithiasis without hydronephrosis. Electronically Signed   By: DLucrezia EuropeM.D.   On: 03/05/2021 14:38      Deema Juncaj T. [place] If 7PM-7AM, please contact night-coverage [email or web address] 03/06/2021, 11:01 AM

## 2021-03-06 NOTE — Progress Notes (Signed)
Subjective: No acute events overnight.  Denies right-sided flank pain and states that hematuria is improving.  CT angio shows no evidence of active extravasation involving the right kidney  Objective: Vital signs in last 24 hours: Temp:  [98.2 F (36.8 C)-99.4 F (37.4 C)] 98.2 F (36.8 C) (05/15 0800) Pulse Rate:  [99-130] 99 (05/15 0800) Resp:  [17-44] 17 (05/15 0800) BP: (127-159)/(66-77) 135/76 (05/15 0800) SpO2:  [83 %-100 %] 97 % (05/15 0800)  Intake/Output from previous day: 05/14 0701 - 05/15 0700 In: 1687.4 [I.V.:1488.5; IV Piggyback:198.9] Out: 2800 [Urine:2800]  Intake/Output this shift: Total I/O In: 300.6 [I.V.:187.7; IV Piggyback:112.9] Out: -   Physical Exam:  General: Alert and oriented   Lab Results: Recent Labs    03/09/2021 1530 03/05/21 0824 03/06/21 0329  HGB 7.8* 8.6* 8.3*  HCT 23.2* 25.0* 24.7*   BMET Recent Labs    03/05/21 0824 03/06/21 0329  NA 133* 133*  K 3.8 3.7  CL 101 103  CO2 24 21*  GLUCOSE 101* 122*  BUN 38* 31*  CREATININE 1.22 1.07  CALCIUM 8.2* 8.0*     Studies/Results: CT ABDOMEN PELVIS WO CONTRAST  Result Date: 03/18/2021 CLINICAL DATA:  Fever with hematuria EXAM: CT ABDOMEN AND PELVIS WITHOUT CONTRAST TECHNIQUE: Multidetector CT imaging of the abdomen and pelvis was performed following the standard protocol without oral or IV contrast. COMPARISON:  None. FINDINGS: Lower chest: There are small pleural effusions bilaterally. There is scarring with lower lobe bronchiectatic change in the base regions. There are scattered foci of coronary artery calcification. Hepatobiliary: The liver contour is nodular, likely due to a degree of underlying hepatic cirrhosis. No focal liver lesions are appreciable on this noncontrast enhanced study. Gallbladder wall is not appreciably thickened. There is no biliary duct dilatation. Pancreas: There is no appreciable pancreatic mass or inflammatory focus. Spleen: No splenic lesions are  evident. There are apparent varices in the splenic region. Adrenals/Urinary Tract: Adrenals bilaterally appear normal. The right kidney appears edematous. There is mild soft tissue stranding in the perinephric fascia on the right. There is an area of increased attenuation in the upper pole the right kidney measuring 5.5 x 5.3 x 5.2 cm. This lesion has an appearance suggesting focal hemorrhage within this area. This lesion may connect with the upper pole collecting system. There is no hydronephrosis on the right. There is no demonstrable mass in the left kidney on this noncontrast enhanced study. No hydronephrosis. There is a 3 x 3 mm calculus in the lower pole the right kidney. There is no appreciable ureteral calculus on either side. Urinary bladder is midline with wall thickness within normal limits. Stomach/Bowel: There is no appreciable bowel wall or mesenteric thickening. No appreciable bowel obstruction. Terminal ileum appears normal. Appendix absent. No free air or portal venous air. Vascular/Lymphatic: No abdominal aortic aneurysm. Foci of calcification noted in each common iliac artery. No adenopathy is evident in the abdomen or pelvis. Subcentimeter inguinal lymph nodes are considered nonspecific. Reproductive: Occasional prostatic calculi noted. Prostate and seminal vesicles normal in size and contour. Other: There is a focal umbilical region hernia containing fat and mild panniculitis. This hernia at its neck measures 1.4 cm from right to left dimension and 1.5 cm from superior to inferior dimension. No bowel containing hernia is evident. There is no abscess or ascites in the abdomen or pelvis. Musculoskeletal: No blastic or lytic bone lesions. No intramuscular lesions. IMPRESSION: 1. Area of increased attenuation in the upper pole of the right kidney measuring  5.5 x 5.3 x 5.2 cm which has an appearance concerning for localized hemorrhage. Etiology for focal hemorrhage in this area is uncertain.  Hemorrhage within a mass or vascular malformation could present in this manner. This finding may warrant CT angiogram for further assessment and characterization. This lesion abuts and may invade a portion of the upper pole collecting system which could account for hematuria. Note that the right kidney appears edematous with mild stranding in the right perinephric fascia. 2. Nonobstructing calculus in the lower pole right kidney measuring 3 x 3 mm. 3. 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 No focal liver lesions evident on noncontrast enhanced study. 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 Spleen is not enlarged. 4. Umbilical hernia containing fat and mild panniculitis. No bowel containing hernia evident. 5. No evident bowel obstruction. No abscess in the abdomen or pelvis. Appendix absent. 6. Small pleural effusions bilaterally. Areas of scarring and bronchiectasis in the lung bases. 7.  Foci of iliac artery and coronary artery calcification. Electronically Signed   By: Bretta Bang III M.D.   On: 03/21/2021 20:08   DG Wrist Complete Left  Result Date: 03/05/2021 CLINICAL DATA:  Left wrist pain for years. Base of first metacarpal phalangeal joint pain. EXAM: LEFT WRIST - COMPLETE 3+ VIEW COMPARISON:  None. FINDINGS: No signs of acute fracture or dislocation. There is narrowing of the radiocarpal joint with degenerative changes at the radiocarpal and distal radioulnar joint. Mild degenerative changes are also noted at the basilar joint. IMPRESSION: 1. 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 2. No acute fracture or dislocation. Electronically Signed   By: Signa Kell M.D.   On: 03/05/2021 11:18   MR WRIST LEFT W WO CONTRAST  Result Date: 03/05/2021 CLINICAL DATA:  Acute on chronic left wrist pain.  MSSA bacteremia. EXAM: MR OF THE LEFT WRIST WITHOUT AND WITH CONTRAST TECHNIQUE:  Multiplanar multisequence MR imaging of the left wrist was performed both before and after the administration of intravenous contrast. CONTRAST:  20mL GADAVIST GADOBUTROL 1 MMOL/ML IV SOLN COMPARISON:  Left wrist x-rays from same day. FINDINGS: Ligaments: Torn lunotriquetral ligament. Intact scapholunate ligament. Triangular fibrocartilage: Large tear of the articular disc. Tendons: Intact flexor and extensor compartment tendons. Small amount of fluid in the common flexor tendon sheath with mild synovial enhancement. Carpal tunnel/median nerve: Normal carpal tunnel. Normal median nerve. Guyon's canal: Normal. Joint/cartilage: Prominent radiocarpal joint space narrowing with extensive full-thickness cartilage loss over the proximal lunate and adjacent distal radius. Degenerative subchondral marrow edema in the volar distal radius centrally. No significant radiocarpal joint effusion. Small midcarpal and distal radioulnar joint complex effusions with thick synovial enhancement. Additional full-thickness cartilage loss over the ulnar aspect of the lunate with underlying subchondral cystic change. Mild first CMC joint osteoarthritis. Bones/carpal alignment: Positive ulnar variance. Normal carpal alignment. No suspicious bone lesion. Other: Focal edema and enlargement of the pronator quadratus muscle with small 1.0 x 0.5 x 1.6 cm rim enhancing fluid collection between the muscle and distal radius (series 5, image 10; series 10, image 17). Prominent fascial edema between the volar margin of the pronator quadratus muscle and flexor muscles (series 5, image 1). IMPRESSION: 1. Infectious myofasciitis of the volar wrist involving the pronator quadratus muscle with small 1.6 cm abscess between the muscle and distal radius. 2. Small midcarpal and distal radioulnar joint complex effusions with thick synovial enhancement, concerning for septic arthritis. No significant radiocarpal joint  effusion. 3. Mild common flexor  tenosynovitis, likely infectious in etiology given proximity to pronator quadratus myofasciitis. 4. 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 Electronically Signed   By: Obie Dredge M.D.   On: 03/05/2021 14:31   DG Chest Port 1 View  Result Date: 03/15/2021 CLINICAL DATA:  Weakness EXAM: PORTABLE CHEST 1 VIEW COMPARISON:  None. FINDINGS: There is mild fullness of the AP window, possibly due to technique. Heart is normal in size. No pleural effusion. No pneumothorax. No acute pleuroparenchymal abnormality. Visualized abdomen is unremarkable. No acute osseous abnormality noted. IMPRESSION: 1. Mild fullness of the AP window, likely due to technique. Consider repeat PA and lateral chest radiograph for improved evaluation. 2. Otherwise no acute cardiopulmonary abnormality. Electronically Signed   By: Meda Klinefelter MD   On: 03/02/2021 16:29   ECHOCARDIOGRAM COMPLETE  Result Date: 03/05/2021    ECHOCARDIOGRAM REPORT   Patient Name:   [facility] Date of Exam: 03/05/2021 Medical Rec #:  [id number]       Height:       69.0 in Accession #:    [id number]      Weight:       144.2 lb Date of Birth:  02-20-1966       BSA:          1.798 m Patient Age:    54 years        BP:           138/76 mmHg Patient Gender: M               HR:           112 bpm. Exam Location:  Inpatient Procedure: 2D Echo, Cardiac Doppler and Color Doppler Indications:    Bacteremia R78.81  History:        Patient has no prior history of Echocardiogram examinations.  Sonographer:    Elmarie Shiley Dance Referring Phys: [id number] CORNELIUS N V[place] IMPRESSIONS  1. Left ventricular ejection fraction, by estimation, is 60 to 65%. The left ventricle has normal function. The left ventricle has no regional wall motion abnormalities. Left  ventricular diastolic parameters were normal.  2. Right ventricular systolic function is normal. The right ventricular size is normal. Tricuspid regurgitation signal is inadequate for assessing PA pressure.  3. Left atrial size was moderately dilated.  4. The mitral valve is normal in structure. Trivial mitral valve regurgitation.  5. The aortic valve is tricuspid. Aortic valve regurgitation is not visualized. No aortic stenosis is present.  6. Aortic dilatation noted. There is mild dilatation of the ascending aorta, measuring 38 mm.  7. Possible PFO, not well visualized  8. The inferior vena cava is normal in size with greater than 50% respiratory variability, suggesting right atrial pressure of 3 mmHg. Conclusion(s)/Recommendation(s): No vegetation seen. If high clinical suspicion for endocarditis, consider TEE. FINDINGS  Left Ventricle: Left ventricular ejection fraction, by estimation, is 60 to 65%. The left ventricle has normal function. The left ventricle has no regional wall motion abnormalities. The left ventricular internal cavity size was normal in size. There is  no left ventricular hypertrophy. Left ventricular diastolic parameters were normal. Right Ventricle: The right ventricular size is normal. No increase in right ventricular wall thickness. Right ventricular systolic function is normal. Tricuspid regurgitation signal is inadequate for assessing PA pressure. Left Atrium:  Left atrial size was moderately dilated. Right Atrium: Right atrial size was normal in size. Pericardium: There is no evidence of pericardial effusion. Mitral Valve: The mitral valve is normal in structure. Trivial mitral valve regurgitation. Tricuspid Valve: The tricuspid valve is normal in structure. Tricuspid valve regurgitation is trivial. Aortic Valve: The aortic valve is tricuspid. Aortic valve regurgitation is not visualized. No aortic stenosis is present. Pulmonic Valve: The pulmonic valve was not well visualized. Pulmonic  valve regurgitation is not visualized. Aorta: The aortic root is normal in size and structure and aortic dilatation noted. There is mild dilatation of the ascending aorta, measuring 38 mm. Venous: The inferior vena cava is normal in size with greater than 50% respiratory variability, suggesting right atrial pressure of 3 mmHg. IAS/Shunts: Evidence of atrial level shunting detected by color flow Doppler.  LEFT VENTRICLE PLAX 2D LVIDd:         5.00 cm  Diastology LVIDs:         2.80 cm  LV e' medial:    10.10 cm/s LV PW:         1.00 cm  LV E/e' medial:  13.9 LV IVS:        0.80 cm  LV e' lateral:   10.20 cm/s LVOT diam:     2.00 cm  LV E/e' lateral: 13.7 LV SV:         89 LV SV Index:   49 LVOT Area:     3.14 cm  RIGHT VENTRICLE             IVC RV Basal diam:  2.80 cm     IVC diam: 1.70 cm RV S prime:     18.20 cm/s TAPSE (M-mode): 2.4 cm LEFT ATRIUM             Index       RIGHT ATRIUM           Index LA diam:        5.50 cm 3.06 cm/m  RA Area:     13.20 cm LA Vol (A2C):   66.3 ml 36.88 ml/m RA Volume:   30.00 ml  16.69 ml/m LA Vol (A4C):   85.5 ml 47.56 ml/m LA Biplane Vol: 76.3 ml 42.44 ml/m  AORTIC VALVE LVOT Vmax:   153.00 cm/s LVOT Vmean:  107.000 cm/s LVOT VTI:    0.283 m  AORTA Ao Root diam: 3.40 cm Ao Asc diam:  3.80 cm MITRAL VALVE MV Area (PHT): 4.21 cm     SHUNTS MV Decel Time: 180 msec     Systemic VTI:  0.28 m MV E velocity: 140.00 cm/s  Systemic Diam: 2.00 cm MV A velocity: 133.00 cm/s MV E/A ratio:  1.05 Epifanio Lesches MD Electronically signed by Epifanio Lesches MD Signature Date/Time: 03/05/2021/2:38:22 PM    Final    CT Angio Abd/Pel w/ and/or w/o  Result Date: 03/05/2021 CLINICAL DATA:  Right renal hemorrhage, flank pain, hematuria EXAM: CTA ABDOMEN AND PELVIS WITH CONTRAST TECHNIQUE: Multidetector CT imaging of the abdomen and pelvis was performed using the standard protocol during bolus administration of intravenous contrast. Multiplanar reconstructed images and MIPs were  obtained and reviewed to evaluate the vascular anatomy. CONTRAST:  OMNIPAQUE IOHEXOL 350 MG/ML SOLN COMPARISON:  The previous day's study FINDINGS: VASCULAR Aorta: Normal caliber aorta without aneurysm, dissection, vasculitis or significant stenosis. Celiac: Partially calcified ostial plaque with short-segment stenosis of only mild severity, patent and unremarkable distally. SMA: Patent without evidence of aneurysm, dissection,  vasculitis or significant stenosis. Replaced right hepatic arterial supply, an anatomic variant. Renals: Single right, widely patent. No pseudoaneurysm, active extravasation, or other vascular lesion evident. Single left, with calcified ostial plaque, no high-grade stenosis, unremarkable distally. IMA: Patent without evidence of aneurysm, dissection, vasculitis or significant stenosis. Inflow: Calcified plaque at the aortic bifurcation without aneurysm or stenosis, unremarkable distally. Proximal Outflow: Mildly atheromatous, patent. Veins: Prominent left retroperitoneal splenorenal shunt. Small enhancing gastric varices are evident on venous phase. Portal vein patent. SMV patent. Splenic vein patent. Hepatic veins patent. Review of the MIP images confirms the above findings. NON-VASCULAR Lower chest: Dependent atelectasis/consolidation in the lung bases. No pleural or pericardial effusion. Hepatobiliary: Nodular liver contour. No focal lesion or biliary ductal dilatation. Gallbladder unremarkable. Pancreas: Unremarkable. No pancreatic ductal dilatation or surrounding inflammatory changes. Spleen: 11.2 cm maximum diameter.  No focal lesion. Adrenals/Urinary Tract: Adrenal glands unremarkable. Left kidney unremarkable. 6.3 cm right upper pole renal intraparenchymal hematoma (previously 5.9). No active arterial extravasation. No aneurysm or other evident etiology of the collection. 8 mm right lower pole collecting system calculus. No hydronephrosis. Urinary bladder physiologically  distended. Stomach/Bowel: Stomach, small bowel, and colon are decompressed. Lymphatic: No abdominal or pelvic adenopathy. Reproductive: Mild prostate enlargement with central coarse calcifications Other: No ascites.  No free air. Musculoskeletal: Moderate paraumbilical hernia containing only mesenteric fat. Regional bones unremarkable. IMPRESSION: 1. Minimal enlargement of right upper pole intraparenchymal renal hematoma, with no active extravasation, pseudoaneurysm, or other evident etiology. After resolution of hematoma, consider elective renal MR with contrast to exclude underlying lesion. 2. Nodular hepatic contour suggesting cirrhosis, with small esophageal varices and a large decompressive splenorenal venous shunt. 3. Right nephrolithiasis without hydronephrosis. Electronically Signed   By: Corlis Leak M.D.   On: 03/05/2021 14:38    Assessment/Plan: 55 year old male with a hemorrhagic mass involving the right kidney associated with gross hematuria along with endocarditis with bacteremia and septic emboli involving multiple organ systems  -H&H stable.  No acute intervention from a GU standpoint at this time. -Repeat imaging of his right kidney in approximately 4 to 6 weeks to reassess the etiology of it (infectious versus malignancy) -Call back as needed   LOS: 2 days   Rhoderick Moody, MD [facility] Pager: [phone]  03/06/2021, 10:29 AM

## 2021-03-06 NOTE — Progress Notes (Signed)
Initial Nutrition Assessment  DOCUMENTATION CODES:   Not applicable  INTERVENTION:  - will order Ensure Enlive BID, each supplement provides 350 kcal and 20 grams of protein. - will Will order 30 ml Prosource Plus BID, each supplement provides 100 kcal and 15 grams protein.  - will order 1 tablet multivitamin with minerals/day. - diet advancement as medically feasible. - complete NFPE when feasible.    NUTRITION DIAGNOSIS:   Increased nutrient needs related to acute illness as evidenced by estimated needs.  GOAL:   Patient will meet greater than or equal to 90% of their needs  MONITOR:   PO intake,Supplement acceptance,Diet advancement,Labs,Weight trends,Skin  REASON FOR ASSESSMENT:   Malnutrition Screening Tool  ASSESSMENT:   55 year old male with medical history of alcohol abuse and COVID-19. He presented to the ED with hematuria, flank pain, weight loss, and confusion. He was dx with SIRS, liver cirrhosis with splenic varices, coagulopathy, hyperbilirubinemia, and AKI. MRI of L wrist and forearm showed small abscess and complex joint effusion concerning for septic arthritis.  Diet advanced from NPO to FLD yesterday at 2007; no intakes documented since that time.   Patient is noted to currently be undergoing cardiac ultrasound.   He has not been seen by a [place] RD at any time in the past. No information documented in the edema section of flow sheet.  Weight yesterday was 144 lb and weight on 5/13 was documented as 133 lb. PTA the most recently documented weight was 184 lb on 07/31/19 at [facility]. This would indicate 40 lb weight loss (22% body weight); not significant for time frame.  Note in the [facility] report indicates patient reported losing ~52 lb since 09/2020.   Per notes: - severe sepsis with MSSA bacteremia - L wrist septic arthritis with small abscess - renal hemorrhage/hematuria with plan for repeat imaging outpatient - liver cirrhosis with splenic  varices--patient reported he stopped drinking 6 months ago - AKI--resolved - debility/physical deconditioning    Labs reviewed; Na: 133 mmol/l, BUN: 31 mg/dl, Ca: 8 mg/dl, ammonia: 47 umol/L. Medications reviewed; 20 g lactulose BID, 5 mg IV vitamin K x1 run 5/15, 10 mg oral vitamin K x1 dose 5/14. IVF; D5-LR @ 50 ml/hr (204 kcal/24 hrs).    NUTRITION - FOCUSED PHYSICAL EXAM:  unable to complete at this time.   Diet Order:   Diet Order            Diet full liquid Room service appropriate? Yes; Fluid consistency: Thin  Diet effective now                 EDUCATION NEEDS:   Not appropriate for education at this time  Skin:  Skin Assessment: Reviewed RN Assessment  Last BM:  5/15 (type 6 x1)  Height:   Ht Readings from Last 1 Encounters:  03/05/21 5\' 9"  (1.753 m)    Weight:   Wt Readings from Last 1 Encounters:  03/05/21 65.4 kg    Estimated Nutritional Needs:  Kcal:  1975-2200 kcal Protein:  100-115 grams Fluid:  >/= 2.2 L/day       03/07/21, MS, RD, LDN, CNSC Inpatient Clinical Dietitian RD pager # available in [facility]  After hours/weekend pager # available in [facility]

## 2021-03-06 NOTE — Consult Note (Signed)
Reason for Consult:L hand pain Referring Physician: Hospitalist  CC:My wrist hurts  HPI:  Greg Bean is an 55 y.o.  male who presents with hematuria to ED, w/u bacteremia, infection/hemorrhage kidney, ? septic L wrist.  Pt with multiple medical problems - significantly cirrhosis with elevated PT/INR.  Pt describes years of L wrist problems from old injury, states that it flares up at times, this most recent time similar to before.  States the it hurts in wrist joint area.  When questioned about his finger function ( he is unable to fully extend, flex) he states that he hasn't been able to make a full fist in some time?!   Associated signs/symptoms:bacteremia, ? Kidney hgb/infection, cirrhosis Previous treatment:  Previous ? Injury to L wrist with limited function for some time per patient Currently on Ancef  History reviewed. No pertinent past medical history.  Past Surgical History:  Procedure Laterality Date  . APPENDECTOMY    . SKIN GRAFT      Family History  Problem Relation Age of Onset  . Cirrhosis Father   . Alcohol abuse Father     Social History:  reports that he has never smoked. He has never used smokeless tobacco. He reports previous alcohol use. He reports that he does not use drugs.  Allergies: No Known Allergies  Medications: I have reviewed the patient's current medications.  Results for orders placed or performed during the hospital encounter of 02/25/2021 (from the past 48 hour(s))  Resp Panel by RT-PCR (Flu A&B, Covid) Nasopharyngeal Swab     Status: None   Collection Time: 02/24/2021  3:28 PM   Specimen: Nasopharyngeal Swab; Nasopharyngeal(NP) swabs in vial transport medium  Result Value Ref Range   SARS Coronavirus 2 by RT PCR NEGATIVE NEGATIVE    Comment: (NOTE) SARS-CoV-2 target nucleic acids are NOT DETECTED.  The SARS-CoV-2 RNA is generally detectable in upper respiratory specimens during the acute phase of infection. The lowest concentration of  SARS-CoV-2 viral copies this assay can detect is 138 copies/mL. A negative result does not preclude SARS-Cov-2 infection and should not be used as the sole basis for treatment or other patient management decisions. A negative result may occur with  improper specimen collection/handling, submission of specimen other than nasopharyngeal swab, presence of viral mutation(s) within the areas targeted by this assay, and inadequate number of viral copies(<138 copies/mL). A negative result must be combined with clinical observations, patient history, and epidemiological information. The expected result is Negative.  Fact Sheet for Patients:  BloggerCourse.com  Fact Sheet for Healthcare Providers:  SeriousBroker.it  This test is no t yet approved or cleared by the [place] FDA and  has been authorized for detection and/or diagnosis of SARS-CoV-2 by FDA under an Emergency Use Authorization (EUA). This EUA will remain  in effect (meaning this test can be used) for the duration of the COVID-19 declaration under Section 564(b)(1) of the Act, 21 U.S.C.section 360bbb-3(b)(1), unless the authorization is terminated  or revoked sooner.       Influenza A by PCR NEGATIVE NEGATIVE   Influenza B by PCR NEGATIVE NEGATIVE    Comment: (NOTE) The Xpert Xpress SARS-CoV-2/FLU/RSV plus assay is intended as an aid in the diagnosis of influenza from Nasopharyngeal swab specimens and should not be used as a sole basis for treatment. Nasal washings and aspirates are unacceptable for Xpert Xpress SARS-CoV-2/FLU/RSV testing.  Fact Sheet for Patients: BloggerCourse.com  Fact Sheet for Healthcare Providers: SeriousBroker.it  This test is not yet approved or  cleared by the [place] and has been authorized for detection and/or diagnosis of SARS-CoV-2 by FDA under an Emergency Use Authorization (EUA).  This EUA will remain in effect (meaning this test can be used) for the duration of the COVID-19 declaration under Section 564(b)(1) of the Act, 21 U.S.C. section 360bbb-3(b)(1), unless the authorization is terminated or revoked.  Performed at [facility], [address], [place], [place] [address]   Lactic acid, plasma     Status: Abnormal   Collection Time: Mar 11, 2021  3:28 PM  Result Value Ref Range   Lactic Acid, Venous 2.9 (HH) 0.5 - 1.9 mmol/L    Comment: CRITICAL RESULT CALLED TO, READ BACK BY AND VERIFIED WITH: ARIEL, RN AT 1652 ON 11-Mar-2021 BY N.THOMPSON Performed at [facility], [address], [place], [place] [address]   Urinalysis, Routine w reflex microscopic Urine, Clean Catch     Status: Abnormal   Collection Time: 11-Mar-2021  3:28 PM  Result Value Ref Range   Color, Urine RED (A) YELLOW   APPearance HAZY (A) CLEAR   Specific Gravity, Urine 1.011 1.005 - 1.030   pH 6.0 5.0 - 8.0   Glucose, UA 50 (A) NEGATIVE mg/dL   Hgb urine dipstick MODERATE (A) NEGATIVE   Bilirubin Urine NEGATIVE NEGATIVE   Ketones, ur NEGATIVE NEGATIVE mg/dL   Protein, ur 629 (A) NEGATIVE mg/dL   Nitrite NEGATIVE NEGATIVE   Leukocytes,Ua NEGATIVE NEGATIVE   RBC / HPF >50 (H) 0 - 5 RBC/hpf   Bacteria, UA RARE (A) NONE SEEN    Comment: Performed at [facility], [address] [place], [place] [address]  Urine rapid drug screen (hosp performed)     Status: None   Collection Time: 03-11-2021  3:28 PM  Result Value Ref Range   Opiates NONE DETECTED NONE DETECTED   Cocaine NONE DETECTED NONE DETECTED   Benzodiazepines NONE DETECTED NONE DETECTED   Amphetamines NONE DETECTED NONE DETECTED   Tetrahydrocannabinol NONE DETECTED NONE DETECTED   Barbiturates NONE DETECTED NONE DETECTED    Comment: (NOTE) DRUG SCREEN FOR MEDICAL PURPOSES ONLY.  IF CONFIRMATION IS NEEDED FOR ANY PURPOSE, NOTIFY LAB WITHIN 5 DAYS.  LOWEST DETECTABLE LIMITS FOR  URINE DRUG SCREEN Drug Class                     Cutoff (ng/mL) Amphetamine and metabolites    1000 Barbiturate and metabolites    200 Benzodiazepine                 200 Tricyclics and metabolites     300 Opiates and metabolites        300 Cocaine and metabolites        300 THC                            50 Performed at [facility], [address] [place], [place] [address]   CBC WITH DIFFERENTIAL     Status: Abnormal   Collection Time: 2021/03/11  3:30 PM  Result Value Ref Range   WBC 18.9 (H) 4.0 - 10.5 K/uL   RBC 2.55 (L) 4.22 - 5.81 MIL/uL   Hemoglobin 7.8 (L) 13.0 - 17.0 g/dL   HCT 10.2 (L) 72.5 - 36.6 %   MCV 91.0 80.0 - 100.0 fL   MCH 30.6 26.0 - 34.0 pg   MCHC 33.6 30.0 - 36.0 g/dL  RDW 17.1 (H) 11.5 - 15.5 %   Platelets 62 (L) 150 - 400 K/uL    Comment: Immature Platelet Fraction may be clinically indicated, consider ordering this additional test [id number]    nRBC 0.2 0.0 - 0.2 %   Neutrophils Relative % 86 %   Neutro Abs 16.2 (H) 1.7 - 7.7 K/uL   Lymphocytes Relative 9 %   Lymphs Abs 1.8 0.7 - 4.0 K/uL   Monocytes Relative 3 %   Monocytes Absolute 0.5 0.1 - 1.0 K/uL   Eosinophils Relative 0 %   Eosinophils Absolute 0.1 0.0 - 0.5 K/uL   Basophils Relative 0 %   Basophils Absolute 0.1 0.0 - 0.1 K/uL   Immature Granulocytes 2 %   Abs Immature Granulocytes 0.30 (H) 0.00 - 0.07 K/uL    Comment: Performed at [facility], [address], [place], [place] [address]  Protime-INR     Status: Abnormal   Collection Time: 02/22/2021  3:30 PM  Result Value Ref Range   Prothrombin Time 28.7 (H) 11.4 - 15.2 seconds   INR 2.7 (H) 0.8 - 1.2    Comment: (NOTE) INR goal varies based on device and disease states. Performed at [facility], [address], [place], [place] [address]   APTT     Status: None   Collection Time: 03/18/2021  3:30 PM  Result Value Ref Range   aPTT 35 24 - 36 seconds    Comment: Performed at  [facility], [address] [place] [address], [place], [place] [address]  Blood Culture (routine x 2)     Status: None (Preliminary result)   Collection Time: 03/22/2021  3:30 PM   Specimen: BLOOD  Result Value Ref Range   Specimen Description      BLOOD LEFT ARM Performed at [facility], [address] [place], [place] [address]    Special Requests      BOTTLES DRAWN AEROBIC AND ANAEROBIC Blood Culture results may not be optimal due to an inadequate volume of blood received in culture bottles Performed at [facility], [address], [place], [place] [address]    Culture  Setup Time      GRAM POSITIVE COCCI IN CLUSTERS IN BOTH AEROBIC AND ANAEROBIC BOTTLES CRITICAL RESULT CALLED TO, READ BACK BY AND VERIFIED WITH: SCOTT CHRISTY PHARMD @0850  03/05/21 EB Performed at [facility], [address], [place], [place] [address]    Culture GRAM POSITIVE COCCI    Report Status PENDING   Reticulocytes     Status: Abnormal   Collection Time: 02/24/2021  3:30 PM  Result Value Ref Range   Retic Ct Pct 2.5 0.4 - 3.1 %   RBC. 2.56 (L) 4.22 - 5.81 MIL/uL   Retic Count, Absolute 64.0 19.0 - 186.0 K/uL   Immature Retic Fract 28.0 (H) 2.3 - 15.9 %    Comment: Performed at [facility], [address], [place], [place] [address]  Blood Culture ID Panel (Reflexed)     Status: Abnormal   Collection Time: 02/28/2021  3:30 PM  Result Value Ref Range   Enterococcus faecalis NOT DETECTED NOT DETECTED   Enterococcus Faecium NOT DETECTED NOT DETECTED   Listeria monocytogenes NOT DETECTED NOT DETECTED   Staphylococcus species DETECTED (A) NOT DETECTED    Comment: CRITICAL RESULT CALLED TO, READ BACK BY AND VERIFIED WITH: SCOTT CHRISTY PHARMD @0850  03/05/21 EB    Staphylococcus aureus (BCID) DETECTED (A) NOT DETECTED  Comment: CRITICAL RESULT CALLED TO, READ BACK BY AND VERIFIED WITH: SCOTT CHRISTY PHARMD @0850  03/05/21 EB    Staphylococcus  epidermidis NOT DETECTED NOT DETECTED   Staphylococcus lugdunensis NOT DETECTED NOT DETECTED   Streptococcus species NOT DETECTED NOT DETECTED   Streptococcus agalactiae NOT DETECTED NOT DETECTED   Streptococcus pneumoniae NOT DETECTED NOT DETECTED   Streptococcus pyogenes NOT DETECTED NOT DETECTED   A.calcoaceticus-baumannii NOT DETECTED NOT DETECTED   Bacteroides fragilis NOT DETECTED NOT DETECTED   Enterobacterales NOT DETECTED NOT DETECTED   Enterobacter cloacae complex NOT DETECTED NOT DETECTED   Escherichia coli NOT DETECTED NOT DETECTED   Klebsiella aerogenes NOT DETECTED NOT DETECTED   Klebsiella oxytoca NOT DETECTED NOT DETECTED   Klebsiella pneumoniae NOT DETECTED NOT DETECTED   Proteus species NOT DETECTED NOT DETECTED   Salmonella species NOT DETECTED NOT DETECTED   Serratia marcescens NOT DETECTED NOT DETECTED   Haemophilus influenzae NOT DETECTED NOT DETECTED   Neisseria meningitidis NOT DETECTED NOT DETECTED   Pseudomonas aeruginosa NOT DETECTED NOT DETECTED   Stenotrophomonas maltophilia NOT DETECTED NOT DETECTED   Candida albicans NOT DETECTED NOT DETECTED   Candida auris NOT DETECTED NOT DETECTED   Candida glabrata NOT DETECTED NOT DETECTED   Candida krusei NOT DETECTED NOT DETECTED   Candida parapsilosis NOT DETECTED NOT DETECTED   Candida tropicalis NOT DETECTED NOT DETECTED   Cryptococcus neoformans/gattii NOT DETECTED NOT DETECTED   Meth resistant mecA/C and MREJ NOT DETECTED NOT DETECTED    Comment: Performed at [facility], [address] [place], [place] [address]  Blood Culture (routine x 2)     Status: None (Preliminary result)   Collection Time: 21-Mar-2021  3:33 PM   Specimen: BLOOD  Result Value Ref Range   Specimen Description      BLOOD RIGHT ANTECUBITAL Performed at [facility], [address], [place], [place] [address]    Special Requests      BOTTLES DRAWN AEROBIC AND ANAEROBIC Blood Culture results may not be optimal  due to an inadequate volume of blood received in culture bottles Performed at [facility], [address], [place], [place] [address]    Culture  Setup Time      GRAM POSITIVE COCCI IN CLUSTERS ANAEROBIC BOTTLE ONLY CRITICAL VALUE NOTED.  VALUE IS CONSISTENT WITH PREVIOUSLY REPORTED AND CALLED VALUE. Performed at [facility], [address] [place], [place] [address]    Culture GRAM POSITIVE COCCI    Report Status PENDING   Comprehensive metabolic panel     Status: Abnormal   Collection Time: Mar 21, 2021  4:19 PM  Result Value Ref Range   Sodium 126 (L) 135 - 145 mmol/L   Potassium 3.9 3.5 - 5.1 mmol/L   Chloride 95 (L) 98 - 111 mmol/L   CO2 22 22 - 32 mmol/L   Glucose, Bld 108 (H) 70 - 99 mg/dL    Comment: Glucose reference range applies only to samples taken after fasting for at least 8 hours.   BUN 41 (H) 6 - 20 mg/dL   Creatinine, Ser 2.95 (H) 0.61 - 1.24 mg/dL   Calcium 7.7 (L) 8.9 - 10.3 mg/dL   Total Protein 8.3 (H) 6.5 - 8.1 g/dL   Albumin 1.6 (L) 3.5 - 5.0 g/dL   AST 76 (H) 15 - 41 U/L   ALT 41 0 - 44 U/L   Alkaline Phosphatase 81 38 - 126 U/L   Total Bilirubin 5.1 (H) 0.3 -  1.2 mg/dL   GFR, Estimated 55 (L) >60 mL/min    Comment: (NOTE) Calculated using the CKD-EPI Creatinine Equation (2021)    Anion gap 9 5 - 15    Comment: Performed at [facility], [address], [place], [place] [address]  Lipase, blood     Status: None   Collection Time: 03/16/2021  4:19 PM  Result Value Ref Range   Lipase 47 11 - 51 U/L    Comment: Performed at [facility], [address], [place], [place] [address]  Magnesium     Status: None   Collection Time: 02/28/2021  4:19 PM  Result Value Ref Range   Magnesium 2.0 1.7 - 2.4 mg/dL    Comment: Performed at [facility], [address], [place], [place] [address]  Hepatitis panel, acute     Status: None   Collection Time: 03/19/2021  6:50 PM  Result Value  Ref Range   Hepatitis B Surface Ag NON REACTIVE NON REACTIVE   HCV Ab NON REACTIVE NON REACTIVE    Comment: (NOTE) Nonreactive HCV antibody screen is consistent with no HCV infections,  unless recent infection is suspected or other evidence exists to indicate HCV infection.     Hep A IgM NON REACTIVE NON REACTIVE   Hep B C IgM NON REACTIVE NON REACTIVE    Comment: Performed at [facility], [address] [place], [place] [address]  Type and screen [facility]     Status: None (Preliminary result)   Collection Time: 02/25/2021  6:58 PM  Result Value Ref Range   ABO/RH(D) B POS    Antibody Screen NEG    Sample Expiration 03/07/2021,2359    Unit Number [id number]    Blood Component Type RED CELLS,LR    Unit division 00    Status of Unit ISSUED    Transfusion Status OK TO TRANSFUSE    Crossmatch Result      Compatible Performed at [facility], [address], [place], [place] [address]   Folate     Status: None   Collection Time: 03/19/2021  7:02 PM  Result Value Ref Range   Folate 11.0 >5.9 ng/mL    Comment: Performed at [facility], [address] [place], [place] [address]  ABO/Rh     Status: None   Collection Time: 03/15/2021  7:20 PM  Result Value Ref Range   ABO/RH(D)      B POS Performed at [facility], [address] [place], [place] [address]   Prepare RBC (crossmatch)     Status: None   Collection Time: 02/21/2021  9:25 PM  Result Value Ref Range   Order Confirmation      ORDER PROCESSED BY BLOOD BANK Performed at [facility], [address] [place] [address] [place], [place] [address]   Prepare fresh frozen plasma     Status: None (Preliminary result)   Collection Time: 02/22/2021  9:25 PM  Result Value Ref Range   Unit Number [id number]    Blood Component Type THW PLS APHR    Unit division B0    Status of Unit ISSUED    Transfusion Status OK TO TRANSFUSE    Unit Number  [id number]    Blood Component Type THAWED PLASMA    Unit division 00    Status of Unit ISSUED    Transfusion Status      OK TO TRANSFUSE Performed at  [facility], [address], [place], [place] [address]    Unit Number [id number]    Blood Component Type THW PLS APHR    Unit division 00    Status of Unit ISSUED    Transfusion Status OK TO TRANSFUSE   Lactic acid, plasma     Status: None   Collection Time: 03/22/2021  9:27 PM  Result Value Ref Range   Lactic Acid, Venous 1.9 0.5 - 1.9 mmol/L    Comment: Performed at [facility], [address], [place], [place] [address]  Ammonia     Status: Abnormal   Collection Time: 02/20/2021  9:27 PM  Result Value Ref Range   Ammonia 48 (H) 9 - 35 umol/L    Comment: Performed at [facility], [address] [place], [place] [address]  Ethanol     Status: None   Collection Time: 02/26/2021  9:27 PM  Result Value Ref Range   Alcohol, Ethyl (B) <10 <10 mg/dL    Comment: (NOTE) Lowest detectable limit for serum alcohol is 10 mg/dL.  For medical purposes only. Performed at [facility], [address] [place], [place] [address]   Hepatic function panel     Status: Abnormal   Collection Time: 03/07/2021  9:27 PM  Result Value Ref Range   Total Protein 7.3 6.5 - 8.1 g/dL   Albumin 1.4 (L) 3.5 - 5.0 g/dL   AST 71 (H) 15 - 41 U/L   ALT 40 0 - 44 U/L   Alkaline Phosphatase 76 38 - 126 U/L   Total Bilirubin 4.7 (H) 0.3 - 1.2 mg/dL   Bilirubin, Direct 2.1 (H) 0.0 - 0.2 mg/dL   Indirect Bilirubin 2.6 (H) 0.3 - 0.9 mg/dL    Comment: Performed at [facility], [address] [place], [place] [address]  MRSA PCR Screening     Status: None   Collection Time: 03/05/21  8:19 AM   Specimen: Nasal Mucosa; Nasopharyngeal  Result Value Ref Range   MRSA by PCR NEGATIVE NEGATIVE    Comment:        The GeneXpert MRSA Assay (FDA approved for NASAL  specimens only), is one component of a comprehensive MRSA colonization surveillance program. It is not intended to diagnose MRSA infection nor to guide or monitor treatment for MRSA infections. Performed at [facility], [address], [place], [place] [address]   Vitamin B12     Status: Abnormal   Collection Time: 03/05/21  8:24 AM  Result Value Ref Range   Vitamin B-12 2,357 (H) 180 - 914 pg/mL    Comment: RESULTS CONFIRMED BY MANUAL DILUTION (NOTE) This assay is not validated for testing neonatal or myeloproliferative syndrome specimens for Vitamin B12 levels. Performed at [facility], [address] [place], [place] [address]   Iron and TIBC     Status: Abnormal   Collection Time: 03/05/21  8:24 AM  Result Value Ref Range   Iron 109 45 - 182 ug/dL   TIBC 010 (L) 272 - 536 ug/dL   Saturation Ratios 82 (H) 17.9 - 39.5 %   UIBC 24 ug/dL    Comment: Performed at [facility], [address] [place], [place] [address]  Ferritin     Status: Abnormal   Collection Time: 03/05/21  8:24 AM  Result Value Ref Range   Ferritin 1,805 (H) 24 - 336 ng/mL    Comment: Performed at  [facility], [address], [place], [place] [address]  Protime-INR     Status: Abnormal   Collection Time: 03/05/21  8:24 AM  Result Value Ref Range   Prothrombin Time 21.8 (H) 11.4 - 15.2 seconds   INR 1.9 (H) 0.8 - 1.2    Comment: (NOTE) INR goal varies based on device and disease states. Performed at [facility], [address] [place], [place] [address]   CBC     Status: Abnormal   Collection Time: 03/05/21  8:24 AM  Result Value Ref Range   WBC 19.0 (H) 4.0 - 10.5 K/uL   RBC 2.78 (L) 4.22 - 5.81 MIL/uL   Hemoglobin 8.6 (L) 13.0 - 17.0 g/dL   HCT 82.9 (L) 56.2 - 13.0 %   MCV 89.9 80.0 - 100.0 fL   MCH 30.9 26.0 - 34.0 pg   MCHC 34.4 30.0 - 36.0 g/dL   RDW 86.5 (H) 78.4 - 69.6 %   Platelets 97  (L) 150 - 400 K/uL    Comment: Immature Platelet Fraction may be clinically indicated, consider ordering this additional test [id number]    nRBC 0.0 0.0 - 0.2 %    Comment: Performed at [facility], [address] [place], [place] [address]  Comprehensive metabolic panel     Status: Abnormal   Collection Time: 03/05/21  8:24 AM  Result Value Ref Range   Sodium 133 (L) 135 - 145 mmol/L    Comment: DELTA CHECK NOTED   Potassium 3.8 3.5 - 5.1 mmol/L   Chloride 101 98 - 111 mmol/L   CO2 24 22 - 32 mmol/L   Glucose, Bld 101 (H) 70 - 99 mg/dL    Comment: Glucose reference range applies only to samples taken after fasting for at least 8 hours.   BUN 38 (H) 6 - 20 mg/dL   Creatinine, Ser 0.27 0.61 - 1.24 mg/dL   Calcium 8.2 (L) 8.9 - 10.3 mg/dL   Total Protein 8.0 6.5 - 8.1 g/dL   Albumin 1.8 (L) 3.5 - 5.0 g/dL   AST 64 (H) 15 - 41 U/L   ALT 37 0 - 44 U/L   Alkaline Phosphatase 85 38 - 126 U/L   Total Bilirubin 6.5 (H) 0.3 - 1.2 mg/dL   GFR, Estimated >25 >36 mL/min    Comment: (NOTE) Calculated using the CKD-EPI Creatinine Equation (2021)    Anion gap 8 5 - 15    Comment: Performed at [facility], [address], [place], [place] [address]  Uric acid     Status: None   Collection Time: 03/05/21  8:24 AM  Result Value Ref Range   Uric Acid, Serum 5.0 3.7 - 8.6 mg/dL    Comment: Performed at [facility], [address] [place], [place] [address]  Comprehensive metabolic panel     Status: Abnormal   Collection Time: 03/06/21  3:29 AM  Result Value Ref Range   Sodium 133 (L) 135 - 145 mmol/L   Potassium 3.7 3.5 - 5.1 mmol/L   Chloride 103 98 - 111 mmol/L   CO2 21 (L) 22 - 32 mmol/L   Glucose, Bld 122 (H) 70 - 99 mg/dL    Comment: Glucose reference range applies only to samples taken after fasting for at least 8 hours.   BUN 31 (H) 6 - 20 mg/dL   Creatinine, Ser 9.56 0.61 - 1.24 mg/dL   Calcium 8.0 (L) 8.9 - 10.3  mg/dL    Total Protein 7.7 6.5 - 8.1 g/dL   Albumin 1.6 (L) 3.5 - 5.0 g/dL   AST 59 (H) 15 - 41 U/L   ALT 32 0 - 44 U/L   Alkaline Phosphatase 85 38 - 126 U/L   Total Bilirubin 5.2 (H) 0.3 - 1.2 mg/dL   GFR, Estimated >26 >33 mL/min    Comment: (NOTE) Calculated using the CKD-EPI Creatinine Equation (2021)    Anion gap 9 5 - 15    Comment: Performed at [facility], [address], [place], [place] [address]  CBC     Status: Abnormal   Collection Time: 03/06/21  3:29 AM  Result Value Ref Range   WBC 16.3 (H) 4.0 - 10.5 K/uL   RBC 2.70 (L) 4.22 - 5.81 MIL/uL   Hemoglobin 8.3 (L) 13.0 - 17.0 g/dL   HCT 25.6 (L) 38.9 - 37.3 %   MCV 91.5 80.0 - 100.0 fL   MCH 30.7 26.0 - 34.0 pg   MCHC 33.6 30.0 - 36.0 g/dL   RDW 42.8 (H) 76.8 - 11.5 %   Platelets 103 (L) 150 - 400 K/uL    Comment: Immature Platelet Fraction may be clinically indicated, consider ordering this additional test [id number] CONSISTENT WITH PREVIOUS RESULT    nRBC 0.0 0.0 - 0.2 %    Comment: Performed at [facility], [address] [place], [place] [address]  Phosphorus     Status: None   Collection Time: 03/06/21  3:29 AM  Result Value Ref Range   Phosphorus 3.0 2.5 - 4.6 mg/dL    Comment: Performed at [facility], [address], [place], [place] [address]  Magnesium     Status: None   Collection Time: 03/06/21  3:29 AM  Result Value Ref Range   Magnesium 1.9 1.7 - 2.4 mg/dL    Comment: Performed at [facility], [address] [place], [place] [address]  Protime-INR     Status: Abnormal   Collection Time: 03/06/21  3:29 AM  Result Value Ref Range   Prothrombin Time 24.4 (H) 11.4 - 15.2 seconds   INR 2.2 (H) 0.8 - 1.2    Comment: (NOTE) INR goal varies based on device and disease states. Performed at [facility], [address] [place], [place] [address]   APTT     Status: None   Collection Time: 03/06/21  3:29 AM   Result Value Ref Range   aPTT 35 24 - 36 seconds    Comment: Performed at [facility], [address], [place], [place] [address]  Ammonia     Status: Abnormal   Collection Time: 03/06/21  3:29 AM  Result Value Ref Range   Ammonia 47 (H) 9 - 35 umol/L    Comment: Performed at [facility], [address], [place], [place] [address]    CT ABDOMEN PELVIS WO CONTRAST  Result Date: 03-19-21 CLINICAL DATA:  Fever with hematuria EXAM: CT ABDOMEN AND PELVIS WITHOUT CONTRAST TECHNIQUE: Multidetector CT imaging of the abdomen and pelvis was performed following the standard protocol without oral or IV contrast. COMPARISON:  None. FINDINGS: Lower chest: There are small pleural effusions bilaterally. There is scarring with lower lobe bronchiectatic change in the base regions. There are scattered foci of coronary artery calcification. Hepatobiliary: The liver contour is nodular, likely due to a degree of underlying hepatic cirrhosis. No focal liver lesions are appreciable on this  noncontrast enhanced study. Gallbladder wall is not appreciably thickened. There is no biliary duct dilatation. Pancreas: There is no appreciable pancreatic mass or inflammatory focus. Spleen: No splenic lesions are evident. There are apparent varices in the splenic region. Adrenals/Urinary Tract: Adrenals bilaterally appear normal. The right kidney appears edematous. There is mild soft tissue stranding in the perinephric fascia on the right. There is an area of increased attenuation in the upper pole the right kidney measuring 5.5 x 5.3 x 5.2 cm. This lesion has an appearance suggesting focal hemorrhage within this area. This lesion may connect with the upper pole collecting system. There is no hydronephrosis on the right. There is no demonstrable mass in the left kidney on this noncontrast enhanced study. No hydronephrosis. There is a 3 x 3 mm calculus in the lower pole the right kidney. There is  no appreciable ureteral calculus on either side. Urinary bladder is midline with wall thickness within normal limits. Stomach/Bowel: There is no appreciable bowel wall or mesenteric thickening. No appreciable bowel obstruction. Terminal ileum appears normal. Appendix absent. No free air or portal venous air. Vascular/Lymphatic: No abdominal aortic aneurysm. Foci of calcification noted in each common iliac artery. No adenopathy is evident in the abdomen or pelvis. Subcentimeter inguinal lymph nodes are considered nonspecific. Reproductive: Occasional prostatic calculi noted. Prostate and seminal vesicles normal in size and contour. Other: There is a focal umbilical region hernia containing fat and mild panniculitis. This hernia at its neck measures 1.4 cm from right to left dimension and 1.5 cm from superior to inferior dimension. No bowel containing hernia is evident. There is no abscess or ascites in the abdomen or pelvis. Musculoskeletal: No blastic or lytic bone lesions. No intramuscular lesions. IMPRESSION: 1. Area of increased attenuation in the upper pole of the right kidney measuring 5.5 x 5.3 x 5.2 cm which has an appearance concerning for localized hemorrhage. Etiology for focal hemorrhage in this area is uncertain. Hemorrhage within a mass or vascular malformation could present in this manner. This finding may warrant CT angiogram for further assessment and characterization. This lesion abuts and may invade a portion of the upper pole collecting system which could account for hematuria. Note that the right kidney appears edematous with mild stranding in the right perinephric fascia. 2. Nonobstructing calculus in the lower pole right kidney measuring 3 x 3 mm. 3. 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 No focal liver lesions evident on noncontrast enhanced study. 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 Spleen is not enlarged. 4. Umbilical hernia  containing fat and mild panniculitis. No bowel containing hernia evident. 5. No evident bowel obstruction. No abscess in the abdomen or pelvis. Appendix absent. 6. Small pleural effusions bilaterally. Areas of scarring and bronchiectasis in the lung bases. 7.  Foci of iliac artery and coronary artery calcification. Electronically Signed   By: Bretta Bang III M.D.   On: 03/12/2021 20:08   DG Wrist Complete Left  Result Date: 03/05/2021 CLINICAL DATA:  Left wrist pain for years. Base of first metacarpal phalangeal joint pain. EXAM: LEFT WRIST - COMPLETE 3+ VIEW COMPARISON:  None. FINDINGS: No signs of acute fracture or dislocation. There is narrowing of the radiocarpal joint with degenerative changes at the radiocarpal and distal radioulnar joint. Mild degenerative changes are also noted at the basilar joint. IMPRESSION: 1. 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 2. No acute fracture or dislocation. Electronically Signed  By: Signa Kell M.D.   On: 03/05/2021 11:18   MR WRIST LEFT W WO CONTRAST  Result Date: 03/05/2021 CLINICAL DATA:  Acute on chronic left wrist pain.  MSSA bacteremia. EXAM: MR OF THE LEFT WRIST WITHOUT AND WITH CONTRAST TECHNIQUE: Multiplanar multisequence MR imaging of the left wrist was performed both before and after the administration of intravenous contrast. CONTRAST:  6mL GADAVIST GADOBUTROL 1 MMOL/ML IV SOLN COMPARISON:  Left wrist x-rays from same day. FINDINGS: Ligaments: Torn lunotriquetral ligament. Intact scapholunate ligament. Triangular fibrocartilage: Large tear of the articular disc. Tendons: Intact flexor and extensor compartment tendons. Small amount of fluid in the common flexor tendon sheath with mild synovial enhancement. Carpal tunnel/median nerve: Normal carpal tunnel. Normal median nerve. Guyon's canal: Normal. Joint/cartilage: Prominent radiocarpal joint space narrowing with extensive full-thickness  cartilage loss over the proximal lunate and adjacent distal radius. Degenerative subchondral marrow edema in the volar distal radius centrally. No significant radiocarpal joint effusion. Small midcarpal and distal radioulnar joint complex effusions with thick synovial enhancement. Additional full-thickness cartilage loss over the ulnar aspect of the lunate with underlying subchondral cystic change. Mild first CMC joint osteoarthritis. Bones/carpal alignment: Positive ulnar variance. Normal carpal alignment. No suspicious bone lesion. Other: Focal edema and enlargement of the pronator quadratus muscle with small 1.0 x 0.5 x 1.6 cm rim enhancing fluid collection between the muscle and distal radius (series 5, image 10; series 10, image 17). Prominent fascial edema between the volar margin of the pronator quadratus muscle and flexor muscles (series 5, image 1). IMPRESSION: 1. Infectious myofasciitis of the volar wrist involving the pronator quadratus muscle with small 1.6 cm abscess between the muscle and distal radius. 2. Small midcarpal and distal radioulnar joint complex effusions with thick synovial enhancement, concerning for septic arthritis. No significant radiocarpal joint effusion. 3. Mild common flexor tenosynovitis, likely infectious in etiology given proximity to pronator quadratus myofasciitis. 4. 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 Electronically Signed   By: Obie Dredge M.D.   On: 03/05/2021 14:31   DG Chest Port 1 View  Result Date: 03/02/2021 CLINICAL DATA:  Weakness EXAM: PORTABLE CHEST 1 VIEW COMPARISON:  None. FINDINGS: There is mild fullness of the AP window, possibly due to technique. Heart is normal in size. No pleural effusion. No pneumothorax. No acute  pleuroparenchymal abnormality. Visualized abdomen is unremarkable. No acute osseous abnormality noted. IMPRESSION: 1. Mild fullness of the AP window, likely due to technique. Consider repeat PA and lateral chest radiograph for improved evaluation. 2. Otherwise no acute cardiopulmonary abnormality. Electronically Signed   By: Meda Klinefelter MD   On: 03/06/2021 16:29   ECHOCARDIOGRAM COMPLETE  Result Date: 03/05/2021    ECHOCARDIOGRAM REPORT   Patient Name:   [facility] Date of Exam: 03/05/2021 Medical Rec #:  [id number]       Height:       69.0 in Accession #:    [id number]      Weight:       144.2 lb Date of Birth:  1966-02-11       BSA:          1.798 m Patient Age:    54 years        BP:           138/76 mmHg Patient Gender: M  HR:           112 bpm. Exam Location:  Inpatient Procedure: 2D Echo, Cardiac Doppler and Color Doppler Indications:    Bacteremia R78.81  History:        Patient has no prior history of Echocardiogram examinations.  Sonographer:    Elmarie Shiley Dance Referring Phys: [id number] CORNELIUS N VAN DAM IMPRESSIONS  1. Left ventricular ejection fraction, by estimation, is 60 to 65%. The left ventricle has normal function. The left ventricle has no regional wall motion abnormalities. Left ventricular diastolic parameters were normal.  2. Right ventricular systolic function is normal. The right ventricular size is normal. Tricuspid regurgitation signal is inadequate for assessing PA pressure.  3. Left atrial size was moderately dilated.  4. The mitral valve is normal in structure. Trivial mitral valve regurgitation.  5. The aortic valve is tricuspid. Aortic valve regurgitation is not visualized. No aortic stenosis is present.  6. Aortic dilatation noted. There is mild dilatation of the ascending aorta, measuring 38 mm.  7. Possible PFO, not well visualized  8. The inferior vena cava is normal in size with greater than 50% respiratory variability, suggesting right atrial pressure of 3  mmHg. Conclusion(s)/Recommendation(s): No vegetation seen. If high clinical suspicion for endocarditis, consider TEE. FINDINGS  Left Ventricle: Left ventricular ejection fraction, by estimation, is 60 to 65%. The left ventricle has normal function. The left ventricle has no regional wall motion abnormalities. The left ventricular internal cavity size was normal in size. There is  no left ventricular hypertrophy. Left ventricular diastolic parameters were normal. Right Ventricle: The right ventricular size is normal. No increase in right ventricular wall thickness. Right ventricular systolic function is normal. Tricuspid regurgitation signal is inadequate for assessing PA pressure. Left Atrium: Left atrial size was moderately dilated. Right Atrium: Right atrial size was normal in size. Pericardium: There is no evidence of pericardial effusion. Mitral Valve: The mitral valve is normal in structure. Trivial mitral valve regurgitation. Tricuspid Valve: The tricuspid valve is normal in structure. Tricuspid valve regurgitation is trivial. Aortic Valve: The aortic valve is tricuspid. Aortic valve regurgitation is not visualized. No aortic stenosis is present. Pulmonic Valve: The pulmonic valve was not well visualized. Pulmonic valve regurgitation is not visualized. Aorta: The aortic root is normal in size and structure and aortic dilatation noted. There is mild dilatation of the ascending aorta, measuring 38 mm. Venous: The inferior vena cava is normal in size with greater than 50% respiratory variability, suggesting right atrial pressure of 3 mmHg. IAS/Shunts: Evidence of atrial level shunting detected by color flow Doppler.  LEFT VENTRICLE PLAX 2D LVIDd:         5.00 cm  Diastology LVIDs:         2.80 cm  LV e' medial:    10.10 cm/s LV PW:         1.00 cm  LV E/e' medial:  13.9 LV IVS:        0.80 cm  LV e' lateral:   10.20 cm/s LVOT diam:     2.00 cm  LV E/e' lateral: 13.7 LV SV:         89 LV SV Index:   49 LVOT Area:      3.14 cm  RIGHT VENTRICLE             IVC RV Basal diam:  2.80 cm     IVC diam: 1.70 cm RV S prime:     18.20 cm/s TAPSE (M-mode): 2.4 cm LEFT  ATRIUM             Index       RIGHT ATRIUM           Index LA diam:        5.50 cm 3.06 cm/m  RA Area:     13.20 cm LA Vol (A2C):   66.3 ml 36.88 ml/m RA Volume:   30.00 ml  16.69 ml/m LA Vol (A4C):   85.5 ml 47.56 ml/m LA Biplane Vol: 76.3 ml 42.44 ml/m  AORTIC VALVE LVOT Vmax:   153.00 cm/s LVOT Vmean:  107.000 cm/s LVOT VTI:    0.283 m  AORTA Ao Root diam: 3.40 cm Ao Asc diam:  3.80 cm MITRAL VALVE MV Area (PHT): 4.21 cm     SHUNTS MV Decel Time: 180 msec     Systemic VTI:  0.28 m MV E velocity: 140.00 cm/s  Systemic Diam: 2.00 cm MV A velocity: 133.00 cm/s MV E/A ratio:  1.05 Epifanio Lesches MD Electronically signed by Epifanio Lesches MD Signature Date/Time: 03/05/2021/2:38:22 PM    Final    CT Angio Abd/Pel w/ and/or w/o  Result Date: 03/05/2021 CLINICAL DATA:  Right renal hemorrhage, flank pain, hematuria EXAM: CTA ABDOMEN AND PELVIS WITH CONTRAST TECHNIQUE: Multidetector CT imaging of the abdomen and pelvis was performed using the standard protocol during bolus administration of intravenous contrast. Multiplanar reconstructed images and MIPs were obtained and reviewed to evaluate the vascular anatomy. CONTRAST:  OMNIPAQUE IOHEXOL 350 MG/ML SOLN COMPARISON:  The previous day's study FINDINGS: VASCULAR Aorta: Normal caliber aorta without aneurysm, dissection, vasculitis or significant stenosis. Celiac: Partially calcified ostial plaque with short-segment stenosis of only mild severity, patent and unremarkable distally. SMA: Patent without evidence of aneurysm, dissection, vasculitis or significant stenosis. Replaced right hepatic arterial supply, an anatomic variant. Renals: Single right, widely patent. No pseudoaneurysm, active extravasation, or other vascular lesion evident. Single left, with calcified ostial plaque, no high-grade  stenosis, unremarkable distally. IMA: Patent without evidence of aneurysm, dissection, vasculitis or significant stenosis. Inflow: Calcified plaque at the aortic bifurcation without aneurysm or stenosis, unremarkable distally. Proximal Outflow: Mildly atheromatous, patent. Veins: Prominent left retroperitoneal splenorenal shunt. Small enhancing gastric varices are evident on venous phase. Portal vein patent. SMV patent. Splenic vein patent. Hepatic veins patent. Review of the MIP images confirms the above findings. NON-VASCULAR Lower chest: Dependent atelectasis/consolidation in the lung bases. No pleural or pericardial effusion. Hepatobiliary: Nodular liver contour. No focal lesion or biliary ductal dilatation. Gallbladder unremarkable. Pancreas: Unremarkable. No pancreatic ductal dilatation or surrounding inflammatory changes. Spleen: 11.2 cm maximum diameter.  No focal lesion. Adrenals/Urinary Tract: Adrenal glands unremarkable. Left kidney unremarkable. 6.3 cm right upper pole renal intraparenchymal hematoma (previously 5.9). No active arterial extravasation. No aneurysm or other evident etiology of the collection. 8 mm right lower pole collecting system calculus. No hydronephrosis. Urinary bladder physiologically distended. Stomach/Bowel: Stomach, small bowel, and colon are decompressed. Lymphatic: No abdominal or pelvic adenopathy. Reproductive: Mild prostate enlargement with central coarse calcifications Other: No ascites.  No free air. Musculoskeletal: Moderate paraumbilical hernia containing only mesenteric fat. Regional bones unremarkable. IMPRESSION: 1. Minimal enlargement of right upper pole intraparenchymal renal hematoma, with no active extravasation, pseudoaneurysm, or other evident etiology. After resolution of hematoma, consider elective renal MR with contrast to exclude underlying lesion. 2. Nodular hepatic contour suggesting cirrhosis, with small esophageal varices and a large decompressive  splenorenal venous shunt. 3. Right nephrolithiasis without hydronephrosis. Electronically Signed   By: Ronald Pippins.D.  On: 03/05/2021 14:38    Pertinent items are noted in HPI. Temp:  [98.2 F (36.8 C)-99.4 F (37.4 C)] 98.2 F (36.8 C) (05/15 0800) Pulse Rate:  [100-130] 100 (05/15 0600) Resp:  [19-44] 30 (05/15 0600) BP: (127-159)/(66-79) 131/77 (05/15 0600) SpO2:  [83 %-100 %] 99 % (05/15 0600) General appearance: alert and cooperative Cardio: regular rate and rhythm Extremities: R wrist wnl in appearance and function; L wrist with minimal swelling, tender over wrist joint , not able to fully extend fingers or make full fist, no obvious open lacerations, abscess    Assessment: Multiple medical problems.  MRI/exam suggestive of wrist joint infection, small abscess pronator;  superimposed on significant underlying wrist degenerative changes Plan: Not a good surgical candidate with elevated INR/cirrhosis...  Will see if IR can place drain in abscess, tap wrist joint; cont medical mangement per medicine/ID.Marland Kitchen. If IR unable, prefer patient to be optimized for surgical drainage.  I have discussed this treatment plan in detail with patient. The patient understands that additional treatment may be necessary.  Timonthy Hovater C Anjelina Dung 03/06/2021, 8:25 AM

## 2021-03-07 ENCOUNTER — Inpatient Hospital Stay (HOSPITAL_COMMUNITY): Payer: 59

## 2021-03-07 DIAGNOSIS — N179 Acute kidney failure, unspecified: Secondary | ICD-10-CM | POA: Diagnosis not present

## 2021-03-07 DIAGNOSIS — D62 Acute posthemorrhagic anemia: Secondary | ICD-10-CM | POA: Diagnosis not present

## 2021-03-07 DIAGNOSIS — B9561 Methicillin susceptible Staphylococcus aureus infection as the cause of diseases classified elsewhere: Secondary | ICD-10-CM | POA: Diagnosis not present

## 2021-03-07 DIAGNOSIS — R7881 Bacteremia: Secondary | ICD-10-CM | POA: Diagnosis not present

## 2021-03-07 DIAGNOSIS — A419 Sepsis, unspecified organism: Secondary | ICD-10-CM | POA: Diagnosis not present

## 2021-03-07 LAB — CULTURE, BLOOD (ROUTINE X 2)

## 2021-03-07 LAB — PREPARE FRESH FROZEN PLASMA
Unit division: 0
Unit division: 0

## 2021-03-07 LAB — BPAM FFP
Blood Product Expiration Date: 202205182359
Blood Product Expiration Date: 202205182359
Blood Product Expiration Date: 202205182359
ISSUE DATE / TIME: 202205140232
ISSUE DATE / TIME: 202205140354
ISSUE DATE / TIME: 202205140523
Unit Type and Rh: 7300
Unit Type and Rh: 7300
Unit Type and Rh: 7300

## 2021-03-07 LAB — CBC
HCT: 25.4 % — ABNORMAL LOW (ref 39.0–52.0)
HCT: 21.2 % — ABNORMAL LOW (ref 39.0–52.0)
HCT: 19.4 % — ABNORMAL LOW (ref 39.0–52.0)
Hemoglobin: 8.7 g/dL — ABNORMAL LOW (ref 13.0–17.0)
Hemoglobin: 7 g/dL — ABNORMAL LOW (ref 13.0–17.0)
Hemoglobin: 6.6 g/dL — CL (ref 13.0–17.0)
MCH: 31.1 pg (ref 26.0–34.0)
MCH: 31 pg (ref 26.0–34.0)
MCH: 31.3 pg (ref 26.0–34.0)
MCHC: 34.3 g/dL (ref 30.0–36.0)
MCHC: 33 g/dL (ref 30.0–36.0)
MCHC: 34 g/dL (ref 30.0–36.0)
MCV: 90.7 fL (ref 80.0–100.0)
MCV: 93.8 fL (ref 80.0–100.0)
MCV: 91.9 fL (ref 80.0–100.0)
Platelets: 106 10*3/uL — ABNORMAL LOW (ref 150–400)
Platelets: 98 10*3/uL — ABNORMAL LOW (ref 150–400)
Platelets: 92 10*3/uL — ABNORMAL LOW (ref 150–400)
RBC: 2.8 MIL/uL — ABNORMAL LOW (ref 4.22–5.81)
RBC: 2.26 MIL/uL — ABNORMAL LOW (ref 4.22–5.81)
RBC: 2.11 MIL/uL — ABNORMAL LOW (ref 4.22–5.81)
RDW: 16.4 % — ABNORMAL HIGH (ref 11.5–15.5)
RDW: 16.6 % — ABNORMAL HIGH (ref 11.5–15.5)
RDW: 16.3 % — ABNORMAL HIGH (ref 11.5–15.5)
WBC: 17 10*3/uL — ABNORMAL HIGH (ref 4.0–10.5)
WBC: 22.7 10*3/uL — ABNORMAL HIGH (ref 4.0–10.5)
WBC: 22.7 10*3/uL — ABNORMAL HIGH (ref 4.0–10.5)
nRBC: 0.2 % (ref 0.0–0.2)
nRBC: 0.2 % (ref 0.0–0.2)
nRBC: 0.2 % (ref 0.0–0.2)

## 2021-03-07 LAB — HEMOGLOBIN AND HEMATOCRIT, BLOOD
HCT: 20.7 % — ABNORMAL LOW (ref 39.0–52.0)
Hemoglobin: 7 g/dL — ABNORMAL LOW (ref 13.0–17.0)

## 2021-03-07 LAB — COMPREHENSIVE METABOLIC PANEL
ALT: 25 U/L (ref 0–44)
AST: 70 U/L — ABNORMAL HIGH (ref 15–41)
Albumin: 1.6 g/dL — ABNORMAL LOW (ref 3.5–5.0)
Alkaline Phosphatase: 102 U/L (ref 38–126)
Anion gap: 7 (ref 5–15)
BUN: 25 mg/dL — ABNORMAL HIGH (ref 6–20)
CO2: 24 mmol/L (ref 22–32)
Calcium: 7.9 mg/dL — ABNORMAL LOW (ref 8.9–10.3)
Chloride: 98 mmol/L (ref 98–111)
Creatinine, Ser: 1.22 mg/dL (ref 0.61–1.24)
GFR, Estimated: >60 mL/min (ref >60)
Glucose, Bld: 123 mg/dL — ABNORMAL HIGH (ref 70–99)
Potassium: 3.9 mmol/L (ref 3.5–5.1)
Sodium: 129 mmol/L — ABNORMAL LOW (ref 135–145)
Total Bilirubin: 4.4 mg/dL — ABNORMAL HIGH (ref 0.3–1.2)
Total Protein: 7.4 g/dL (ref 6.5–8.1)

## 2021-03-07 LAB — MAGNESIUM: Magnesium: 1.7 mg/dL (ref 1.7–2.4)

## 2021-03-07 LAB — URINE CULTURE: Culture: >=100000 — AB

## 2021-03-07 LAB — PHOSPHORUS: Phosphorus: 2.7 mg/dL (ref 2.5–4.6)

## 2021-03-07 LAB — PROTIME-INR
INR: 2.3 — ABNORMAL HIGH (ref 0.8–1.2)
Prothrombin Time: 24.9 seconds — ABNORMAL HIGH (ref 11.4–15.2)

## 2021-03-07 LAB — PREPARE RBC (CROSSMATCH)

## 2021-03-07 MED ORDER — LABETALOL HCL 5 MG/ML IV SOLN
10.0000 mg | Freq: Once | INTRAVENOUS | Status: AC
  Administered 2021-03-07: 10 mg via INTRAVENOUS
  Filled 2021-03-07: qty 4

## 2021-03-07 MED ORDER — SODIUM CHLORIDE 0.9 % IR SOLN
3000.0000 mL | Status: DC
  Administered 2021-03-07 – 2021-03-10 (×50): 3000 mL

## 2021-03-07 MED ORDER — SODIUM CHLORIDE 0.9% IV SOLUTION: Freq: Once | INTRAVENOUS | Status: AC

## 2021-03-07 MED ORDER — VITAMIN K1 10 MG/ML IJ SOLN
10.0000 mg | Freq: Once | INTRAVENOUS | Status: AC
  Administered 2021-03-07: 10 mg via INTRAVENOUS
  Filled 2021-03-07: qty 1

## 2021-03-07 NOTE — Progress Notes (Signed)
CT results/images reviewed.  Patient is likely showering emboli into both kidneys which is causing upper tract bleeding (especially on the right).  Right renal lesion is stable in size.  TRH in the process of correcting supratherapeutic INR and anemia.  Keep Foley to CBI to prevent clot from accumlating in his bladder. Will continue to monitor.

## 2021-03-07 NOTE — TOC Initial Note (Signed)
Transition of Care [facility]) - Initial/Assessment Note    Patient Details  Name: Greg Bean MRN: [id number] Date of Birth: 1966/05/02  [facility] CM/SW Contact:    Golda Acre, RN Phone Number: 03/07/2021, 9:04 AM  Clinical Narrative:                 Chief complaints: Hematuria, confusion and flank pain  Brief Narrative / Interim history: 55 year old M with no PMH other than previous alcohol abuse and COVID-19 illness presenting with hematuria, flank pain, weight loss and confusion, and found to have SIRS, liver cirrhosis with splenic varices, coagulopathy, hyperbilirubinemia, ABLA and AKI.   Patient received 3 units of FFP, 10 mg of IV vitamin K and 1u pRBCs.  Started on IV ceftriaxone for SIRS, and IVF for AKI.  GI and urology consulted.   The next day, blood culture with MSSA in 3 out of 4 bottles.  MRI left wrist/forearm with small abscess and complex joint effusion concerning for septic arthritis.  Infectious disease and hand surgery consulted.  Antibiotics de-escalated to Ancef by ID.  TTE without significant finding.  On surgery recommended IR consult for tapping wrist joint bld cultures x2 postivie for staph. Urine cultre + staph, wbc 22.7, hgb 7.0, na 129/ iv anceg=f Plan: following for progression and toc home needs.  pt was transferred down to icu pm of [id number]     Patient Goals and CMS Choice        Expected Discharge Plan and Services                                                Prior Living Arrangements/Services                       Activities of Daily Living Home Assistive Devices/Equipment: Eyeglasses ADL Screening (condition at time of admission) Patient's cognitive ability adequate to safely complete daily activities?: Yes Is the patient deaf or have difficulty hearing?: No Does the patient have difficulty seeing, even when wearing glasses/contacts?: Yes (blurry) Does the patient have difficulty  concentrating, remembering, or making decisions?: No Patient able to express need for assistance with ADLs?: Yes Does the patient have difficulty dressing or bathing?: No Independently performs ADLs?: No Communication: Independent Dressing (OT): Independent Grooming: Independent Feeding: Independent Bathing: Independent Toileting: Needs assistance Is this a change from baseline?: Pre-admission baseline In/Out Bed: Needs assistance Is this a change from baseline?: Pre-admission baseline Walks in Home: Needs assistance Is this a change from baseline?: Pre-admission baseline Does the patient have difficulty walking or climbing stairs?: Yes Weakness of Legs: None Weakness of Arms/Hands: None  Permission Sought/Granted                  Emotional Assessment              Admission diagnosis:  Hyperbilirubinemia [E80.6] Hyponatremia [E87.1] Thrombocytopenia (HCC) [D69.6] Renal hemorrhage, right [N28.89] Alcoholic cirrhosis of liver without ascites (HCC) [K70.30] Febrile illness [R50.9] Anemia, unspecified type [D64.9] Patient Active Problem List   Diagnosis Date Noted  . Anemia   . Thrombocytopenia (HCC)   . Staphylococcal arthritis of left wrist (HCC)   . MSSA bacteremia 03/05/2021  . Renal hemorrhage, right 03/05/2021  . SIRS (systemic inflammatory response syndrome) (HCC) 03/03/2021  . Cirrhosis of liver (HCC) 03/20/2021  . ABLA (acute blood  loss anemia) 03/21/2021  . AKI (acute kidney injury) (HCC) 03/21/2021   PCP:  Oneita Hurt, No Pharmacy:   [facility] [phone] - [place], [place] - [address] AT [facility] [address]  [place] [address] Phone: [phone] Fax: [phone]     Social Determinants of Health (SDOH) Interventions    Readmission Risk Interventions No flowsheet data found.

## 2021-03-07 NOTE — Progress Notes (Signed)
[facility] Progress Note  Greg Bean 55 y.o. 1966-09-03   Subjective: Lying in bed. Denies abdominal pain. Red mucousy stool reported last night in flowsheet. Nursing reports hematuria overnight.   Objective: Vital signs: Vitals:   03/07/21 0600 03/07/21 0745  BP: 128/65   Pulse: (!) 109   Resp:    Temp:  99.3 F (37.4 C)  SpO2: 95%   R 29  Physical Exam: Gen: lethargic, chronically ill-appearing, thin, no acute distress  HEENT: +icteric sclera CV: RRR Chest: CTA B Abd: soft, nontender, nondistended, +BS Ext: no edema  Lab Results: Recent Labs    03/06/21 0329 03/07/21 0637  NA 133* 129*  K 3.7 3.9  CL 103 98  CO2 21* 24  GLUCOSE 122* 123*  BUN 31* 25*  CREATININE 1.07 1.22  CALCIUM 8.0* 7.9*  MG 1.9 1.7  PHOS 3.0 2.7   Recent Labs    03/06/21 0329 03/07/21 0637  AST 59* 70*  ALT 32 25  ALKPHOS 85 102  BILITOT 5.2* 4.4*  PROT 7.7 7.4  ALBUMIN 1.6* 1.6*   Recent Labs    03/20/2021 1530 03/05/21 0824 03/07/21 0637 03/07/21 0905  WBC 18.9*   < > 22.7* 22.7*  NEUTROABS 16.2*  --   --   --   HGB 7.8*   < > 7.0* 6.6*  HCT 23.2*   < > 21.2* 19.4*  MCV 91.0   < > 93.8 91.9  PLT 62*   < > 98* 92*   < > = values in this interval not displayed.      Assessment/Plan: Alcoholic cirrhosis - elevated ferritin likely secondary iron overload from alcohol. Anemia multifactorial. Correct coagulopathy. Continue Lactulose. I do not think endoscopy needed at this time. Supportive care. Will follow.   Greg Bean 03/07/2021, 9:40 AM  Questions please call [phone] ID: Greg Bean, male   DOB: 27-Dec-1965, 55 y.o.   MRN: [id number]

## 2021-03-07 NOTE — Progress Notes (Signed)
[facility]   Reason for visit: Follow up on bacteremia  Interval History: 3/4 bottles positive for MSSA; WBC 22.7, Tmax 100.5   Physical Exam: Constitutional:  Vitals:   03/07/21 0600 03/07/21 0745  BP: 128/65   Pulse: (!) 109   Resp:    Temp:  99.3 F (37.4 C)  SpO2: 95%    patient appears in NAD Respiratory: Normal respiratory effort; CTA B Cardiovascular: RRR GI: soft, nt, nd MS: left wrist with swelling  Review of Systems: Constitutional: negative for chills Hematologic/lymphatic: negative for lymphadenopathy  Lab Results  Component Value Date   WBC 22.7 (H) 03/07/2021   HGB 6.6 (LL) 03/07/2021   HCT 19.4 (L) 03/07/2021   MCV 91.9 03/07/2021   PLT 92 (L) 03/07/2021    Lab Results  Component Value Date   CREATININE 1.22 03/07/2021   BUN 25 (H) 03/07/2021   NA 129 (L) 03/07/2021   K 3.9 03/07/2021   CL 98 03/07/2021   CO2 24 03/07/2021    Lab Results  Component Value Date   ALT 25 03/07/2021   AST 70 (H) 03/07/2021   ALKPHOS 102 03/07/2021     Microbiology: Recent Results (from the past 240 hour(s))  Resp Panel by RT-PCR (Flu A&B, Covid) Nasopharyngeal Swab     Status: None   Collection Time: 29-Mar-2021  3:28 PM   Specimen: Nasopharyngeal Swab; Nasopharyngeal(NP) swabs in vial transport medium  Result Value Ref Range Status   SARS Coronavirus 2 by RT PCR NEGATIVE NEGATIVE Final    Comment: (NOTE) SARS-CoV-2 target nucleic acids are NOT DETECTED.  The SARS-CoV-2 RNA is generally detectable in upper respiratory specimens during the acute phase of infection. The lowest concentration of SARS-CoV-2 viral copies this assay can detect is 138 copies/mL. A negative result does not preclude SARS-Cov-2 infection and should not be used as the sole basis for treatment or other patient management decisions. A negative result may occur with  improper specimen collection/handling, submission of specimen other than nasopharyngeal  swab, presence of viral mutation(s) within the areas targeted by this assay, and inadequate number of viral copies(<138 copies/mL). A negative result must be combined with clinical observations, patient history, and epidemiological information. The expected result is Negative.  Fact Sheet for Patients:  BloggerCourse.com  Fact Sheet for Healthcare Providers:  SeriousBroker.it  This test is no t yet approved or cleared by the [place] FDA and  has been authorized for detection and/or diagnosis of SARS-CoV-2 by FDA under an Emergency Use Authorization (EUA). This EUA will remain  in effect (meaning this test can be used) for the duration of the COVID-19 declaration under Section 564(b)(1) of the Act, 21 U.S.C.section 360bbb-3(b)(1), unless the authorization is terminated  or revoked sooner.       Influenza A by PCR NEGATIVE NEGATIVE Final   Influenza B by PCR NEGATIVE NEGATIVE Final    Comment: (NOTE) The Xpert Xpress SARS-CoV-2/FLU/RSV plus assay is intended as an aid in the diagnosis of influenza from Nasopharyngeal swab specimens and should not be used as a sole basis for treatment. Nasal washings and aspirates are unacceptable for Xpert Xpress SARS-CoV-2/FLU/RSV testing.  Fact Sheet for Patients: BloggerCourse.com  Fact Sheet for Healthcare Providers: SeriousBroker.it  This test is not yet approved or cleared by the [place] FDA and has been authorized for detection and/or diagnosis of SARS-CoV-2 by FDA under an Emergency Use Authorization (EUA). This EUA will remain in effect (meaning this test can be  used) for the duration of the COVID-19 declaration under Section 564(b)(1) of the Act, 21 U.S.C. section 360bbb-3(b)(1), unless the authorization is terminated or revoked.  Performed at [facility], [address], [place], [place] [address]    Urine culture     Status: Abnormal   Collection Time: 01-Apr-2021  3:28 PM   Specimen: In/Out Cath Urine  Result Value Ref Range Status   Specimen Description   Final    IN/OUT CATH URINE Performed at [facility], [address], [place], [place] [address]    Special Requests   Final    NONE Performed at [facility], [address] [place], [place] [address]    Culture >=100,000 COLONIES/mL STAPHYLOCOCCUS AUREUS (A)  Final   Report Status 03/07/2021 FINAL  Final   Organism ID, Bacteria STAPHYLOCOCCUS AUREUS (A)  Final      Susceptibility   Staphylococcus aureus - MIC*    CIPROFLOXACIN <=0.5 SENSITIVE Sensitive     GENTAMICIN <=0.5 SENSITIVE Sensitive     NITROFURANTOIN <=16 SENSITIVE Sensitive     OXACILLIN <=0.25 SENSITIVE Sensitive     TETRACYCLINE >=16 RESISTANT Resistant     VANCOMYCIN 1 SENSITIVE Sensitive     TRIMETH/SULFA <=10 SENSITIVE Sensitive     CLINDAMYCIN <=0.25 SENSITIVE Sensitive     RIFAMPIN <=0.5 SENSITIVE Sensitive     Inducible Clindamycin NEGATIVE Sensitive     * >=100,000 COLONIES/mL STAPHYLOCOCCUS AUREUS  Blood Culture (routine x 2)     Status: Abnormal   Collection Time: 04/01/21  3:30 PM   Specimen: BLOOD  Result Value Ref Range Status   Specimen Description   Final    BLOOD LEFT ARM Performed at [facility], [address], [place], [place] [address]    Special Requests   Final    BOTTLES DRAWN AEROBIC AND ANAEROBIC Blood Culture results may not be optimal due to an inadequate volume of blood received in culture bottles Performed at [facility], [address], [place], [place] [address]    Culture  Setup Time   Final    GRAM POSITIVE COCCI IN CLUSTERS IN BOTH AEROBIC AND ANAEROBIC BOTTLES CRITICAL RESULT CALLED TO, READ BACK BY AND VERIFIED WITH: SCOTT CHRISTY PHARMD @0850  03/05/21 EB Performed at [facility], [address], [place], [place]     Culture STAPHYLOCOCCUS AUREUS (A)  Final   Report Status 03/07/2021 FINAL  Final   Organism ID, Bacteria STAPHYLOCOCCUS AUREUS  Final      Susceptibility   Staphylococcus aureus - MIC*    CIPROFLOXACIN <=0.5 SENSITIVE Sensitive     ERYTHROMYCIN <=0.25 SENSITIVE Sensitive     GENTAMICIN <=0.5 SENSITIVE Sensitive     OXACILLIN <=0.25 SENSITIVE Sensitive     TETRACYCLINE >=16 RESISTANT Resistant     VANCOMYCIN 1 SENSITIVE Sensitive     TRIMETH/SULFA <=10 SENSITIVE Sensitive     CLINDAMYCIN <=0.25 SENSITIVE Sensitive     RIFAMPIN <=0.5 SENSITIVE Sensitive     Inducible Clindamycin NEGATIVE Sensitive     * STAPHYLOCOCCUS AUREUS  Blood Culture ID Panel (Reflexed)     Status: Abnormal   Collection Time: Apr 01, 2021  3:30 PM  Result Value Ref Range Status   Enterococcus faecalis NOT DETECTED NOT DETECTED Final   Enterococcus Faecium NOT DETECTED NOT DETECTED Final   Listeria monocytogenes NOT DETECTED NOT DETECTED Final   Staphylococcus species DETECTED (A) NOT DETECTED Final    Comment: CRITICAL RESULT CALLED  TO, READ BACK BY AND VERIFIED WITH: SCOTT CHRISTY PHARMD @0850  03/05/21 EB    Staphylococcus aureus (BCID) DETECTED (A) NOT DETECTED Final    Comment: CRITICAL RESULT CALLED TO, READ BACK BY AND VERIFIED WITH: SCOTT CHRISTY PHARMD @0850  03/05/21 EB    Staphylococcus epidermidis NOT DETECTED NOT DETECTED Final   Staphylococcus lugdunensis NOT DETECTED NOT DETECTED Final   Streptococcus species NOT DETECTED NOT DETECTED Final   Streptococcus agalactiae NOT DETECTED NOT DETECTED Final   Streptococcus pneumoniae NOT DETECTED NOT DETECTED Final   Streptococcus pyogenes NOT DETECTED NOT DETECTED Final   A.calcoaceticus-baumannii NOT DETECTED NOT DETECTED Final   Bacteroides fragilis NOT DETECTED NOT DETECTED Final   Enterobacterales NOT DETECTED NOT DETECTED Final   Enterobacter cloacae complex NOT DETECTED NOT DETECTED Final   Escherichia coli NOT DETECTED NOT DETECTED Final   Klebsiella  aerogenes NOT DETECTED NOT DETECTED Final   Klebsiella oxytoca NOT DETECTED NOT DETECTED Final   Klebsiella pneumoniae NOT DETECTED NOT DETECTED Final   Proteus species NOT DETECTED NOT DETECTED Final   Salmonella species NOT DETECTED NOT DETECTED Final   Serratia marcescens NOT DETECTED NOT DETECTED Final   Haemophilus influenzae NOT DETECTED NOT DETECTED Final   Neisseria meningitidis NOT DETECTED NOT DETECTED Final   Pseudomonas aeruginosa NOT DETECTED NOT DETECTED Final   Stenotrophomonas maltophilia NOT DETECTED NOT DETECTED Final   Candida albicans NOT DETECTED NOT DETECTED Final   Candida auris NOT DETECTED NOT DETECTED Final   Candida glabrata NOT DETECTED NOT DETECTED Final   Candida krusei NOT DETECTED NOT DETECTED Final   Candida parapsilosis NOT DETECTED NOT DETECTED Final   Candida tropicalis NOT DETECTED NOT DETECTED Final   Cryptococcus neoformans/gattii NOT DETECTED NOT DETECTED Final   Meth resistant mecA/C and MREJ NOT DETECTED NOT DETECTED Final    Comment: Performed at [facility], [address], [place], [address] [place]  Blood Culture (routine x 2)     Status: Abnormal   Collection Time: 2021/03/16  3:33 PM   Specimen: BLOOD  Result Value Ref Range Status   Specimen Description   Final    BLOOD RIGHT ANTECUBITAL Performed at [facility], [address] [place], [place]    Special Requests   Final    BOTTLES DRAWN AEROBIC AND ANAEROBIC Blood Culture results may not be optimal due to an inadequate volume of blood received in culture bottles Performed at [facility], [address], [place], [place]    Culture  Setup Time   Final    GRAM POSITIVE COCCI IN CLUSTERS ANAEROBIC BOTTLE ONLY CRITICAL VALUE NOTED.  VALUE IS CONSISTENT WITH PREVIOUSLY REPORTED AND CALLED VALUE.    Culture (A)  Final    STAPHYLOCOCCUS AUREUS SUSCEPTIBILITIES PERFORMED ON PREVIOUS CULTURE WITHIN THE LAST 5  DAYS. Performed at [facility], [address], [place], [address] [place]    Report Status 03/07/2021 FINAL  Final  MRSA PCR Screening     Status: None   Collection Time: 03/05/21  8:19 AM   Specimen: Nasal Mucosa; Nasopharyngeal  Result Value Ref Range Status   MRSA by PCR NEGATIVE NEGATIVE Final    Comment:        The GeneXpert MRSA Assay (FDA approved for NASAL specimens only), is one component of a comprehensive MRSA colonization surveillance program. It is not intended to diagnose MRSA infection nor to guide or monitor treatment for MRSA infections. Performed at [facility],  [address] [place], [place] [address]   Culture, blood (Routine X 2) w Reflex to ID Panel     Status: None (Preliminary result)   Collection Time: 03/05/21  2:22 PM   Specimen: BLOOD  Result Value Ref Range Status   Specimen Description   Final    BLOOD LEFT ANTECUBITAL Performed at [facility], [address] [place], [place] [address]    Special Requests   Final    BOTTLES DRAWN AEROBIC ONLY Blood Culture adequate volume Performed at [facility], [address] [place], [place] [address]    Culture   Final    NO GROWTH < 24 HOURS Performed at [facility], [address] [place], [place] [address]    Report Status PENDING  Incomplete  Culture, blood (Routine X 2) w Reflex to ID Panel     Status: None (Preliminary result)   Collection Time: 03/05/21  2:24 PM   Specimen: BLOOD LEFT HAND  Result Value Ref Range Status   Specimen Description   Final    BLOOD LEFT HAND Performed at [facility], [address] [place], [place] [address]    Special Requests   Final    BOTTLES DRAWN AEROBIC ONLY Blood Culture results may not be optimal due to an inadequate volume of blood received in culture bottles Performed at [facility], [address] [place], [place] [address]     Culture   Final    NO GROWTH < 24 HOURS Performed at [facility], [address] [place], [place] [address]    Report Status PENDING  Incomplete    Impression/Plan:  1. MSSA bacteremia - repeat blood cultures sent and ngtd.  TTE with no obvious vegetation, though still very likely with this disseminated infection/likely septic emboli.   Will plan on a prolonged 6 weeks treatment course. No absolute indication for TEE at this time from ID standpoint.  2.  Wrist septic arthritis - still with considerable pain. IR unable to aspirate and will need source control when able per Dr. Izora Ribasoley.    3.  Hemorrhagic mass of right kidney - gross hematuria persists and hgb has decreased.

## 2021-03-07 NOTE — Progress Notes (Signed)
MD aware that patient is urinating frank blood and blood clots. MD states Urology will consult. Q4 CBCs ordered to monitor hemoglobin levels.

## 2021-03-07 NOTE — Evaluation (Signed)
Occupational Therapy Evaluation Patient Details Name: Greg Bean MRN: [id number] DOB: 11/24/1965 Today's Date: 03/07/2021    History of Present Illness 55 year old male with medical history of alcohol abuse and COVID-19. He presented to the ED with hematuria, flank pain, weight loss, and confusion. He was dx with SIRS, liver cirrhosis with splenic varices, coagulopathy, hyperbilirubinemia, and AKI. MRI of L wrist and forearm showed small abscess and complex joint effusion concerning for septic arthritis.   Clinical Impression   Patient is currently requiring assistance with ADLs including maximum assist with toileting with bowel and bladder incontinence immediately upon standing from EOB with Min guard assist, Total assist with LE dressing, moderate assist with bathing, and setup assist with UE dressing, grooming and eating, all of which is below patient's typical baseline of being Independent.  During this evaluation, patient was limited by pain which pt describes as being in his kidneys, somnolence with decreased activity tolerance, and hematuria with global incontinence, which has the potential to impact patient's safety and independence during functional mobility, as well as performance for ADLs. [employer] AM-PAC "6-clicks" Daily Activity Inpatient Short Form score of 15/24 indicates 56.46% ADL impairment this session. Patient lives alone in a  2 story condo in [place]. His Mother lives in [place], but is able to provide 24/7 supervision and assistance if needed at d/c.  Patient demonstrates good rehab potential, and should benefit from continued skilled occupational therapy services while in acute care to maximize safety, independence and quality of life at home.   ?    Follow Up Recommendations  No OT follow up    Equipment Recommendations   (TBD)    Recommendations for Other Services       Precautions / Restrictions Precautions Precautions: Fall Restrictions Weight  Bearing Restrictions: No (Possible updates for LT wrist.)      Mobility Bed Mobility Overal bed mobility: Needs Assistance Bed Mobility: Supine to Sit;Sit to Supine     Supine to sit: Min assist Sit to supine: Supervision   General bed mobility comments: Pt requesting Min HHA to raise trunk off bed. Able to return to supine with supervision. Patient Response: Anxious ("feels like it's choking me." Gabbs adjusted for pt comfort.)  Transfers Overall transfer level: Needs assistance Equipment used: Rolling walker (2 wheeled) Transfers: Sit to/from Stand Sit to Stand: Min guard         General transfer comment: supervision to Min guard to stand from unelevated bed. Pt initiallt stood to RW, but able to stand without RW support as well.    Balance Overall balance assessment: Needs assistance;Mild deficits observed, not formally tested   Sitting balance-Leahy Scale: Good     Standing balance support: No upper extremity supported;Single extremity supported Standing balance-Leahy Scale: Fair                             ADL either performed or assessed with clinical judgement   ADL Overall ADL's : Needs assistance/impaired Eating/Feeding: Set up;Bed level;Sitting Eating/Feeding Details (indicate cue type and reason): Sipping from cup at bed level and at EOB with setup-cup handed to him. Grooming: Wash/dry hands;Sitting;Set up   Upper Body Bathing: Min guard;Sitting   Lower Body Bathing: Maximal assistance;Sitting/lateral leans;Sit to/from stand   Upper Body Dressing : Minimal assistance;Sitting   Lower Body Dressing: Total assistance Lower Body Dressing Details (indicate cue type and reason): To doff socks (due to soiled) while sitting EOB.   Statistician  Details (indicate cue type and reason): NT-please see mobility.  Pt voided bowel and bladder with frank bloody urine immediately upon standing. Pt assisted with hygiene while standing EOB. Toileting- Clothing  Manipulation and Hygiene: Maximal assistance;Sitting/lateral lean;Sit to/from stand Toileting - Clothing Manipulation Details (indicate cue type and reason): Pt incontinent of bladder and bowel upon standing.  Pt able to stand and perform minimal posterior peri hygiene, otherwise Maximum assist for complete hygiene.     Functional mobility during ADLs: Min guard;Minimal assistance;Supervision/safety;Rolling walker       Vision   Vision Assessment?: No apparent visual deficits     Perception     Praxis      Pertinent Vitals/Pain Pain Assessment: 0-10 Pain Score: 8  Pain Location: "kidneys" Pain Intervention(s): Limited activity within patient's tolerance;Monitored during session (MD and RN in room and notified.)     Hand Dominance Right   Extremity/Trunk Assessment Upper Extremity Assessment Upper Extremity Assessment: Overall WFL for tasks assessed;LUE deficits/detail LUE Deficits / Details: LT wrist NT due to possible septic arthritis.   Lower Extremity Assessment Lower Extremity Assessment: Defer to PT evaluation   Cervical / Trunk Assessment Cervical / Trunk Assessment: Normal   Communication Communication Communication: Other (comment);No difficulties (Speech is very low volume. Pt reports this is from feeling weak.)   Cognition Arousal/Alertness: Lethargic (more alert once sitting EOB.) Behavior During Therapy: Flat affect Overall Cognitive Status: No family/caregiver present to determine baseline cognitive functioning                                 General Comments: Pt disoriented to month and situation. Pt stated that his mother called EMS and pt has "no idea" why he is in the hospital.   General Comments       Exercises     Shoulder Instructions      Home Living Family/patient expects to be discharged to:: Private residence Living Arrangements: Alone Available Help at Discharge: Family;Available 24 hours/day (Pt reports that his Mother is  in good health and can be available 24/7 if needed.) Type of Home: Apartment (2 story condo) Home Access: Level entry     Home Layout: Two level;Bed/bath upstairs Alternate Level Stairs-Number of Steps: Pt reports Bil rail 1/2 way up stairs, then no rails for 2nd half of stairs to [address]md floor.   Bathroom Shower/Tub: Doctor, general practice: None          Prior Functioning/Environment Level of Independence: Independent        Comments: Pt does not drive, and reports was let go of work for multiple absences due to illness. Mother can provide transportation.        OT Problem List: Pain;Decreased activity tolerance;Impaired balance (sitting and/or standing);Decreased knowledge of use of DME or AE (Imapired ADLs)      OT Treatment/Interventions: Self-care/ADL training;Therapeutic activities;Energy conservation;DME and/or AE instruction;Patient/family education;Balance training    OT Goals(Current goals can be found in the care plan section) Acute Rehab OT Goals Patient Stated Goal: "Do something about my kidneys."  Pt voiced no therapy related goals. Potential to Achieve Goals: Good (OT goals) ADL Goals Pt Will Perform Grooming: Independently;standing Pt Will Transfer to Toilet: Independently;ambulating Pt Will Perform Toileting - Clothing Manipulation and hygiene: Independently Additional ADL Goal #1: Pt will engage in 10+ min standing functional activities with Vital stable and without loss of balance, in order  to demonstrate improved activity tolerance and balance needed to perform ADLs safely at home.  OT Frequency: Min 2X/week   Barriers to D/C:            Co-evaluation              AM-PAC OT "6 Clicks" Daily Activity     Outcome Measure Help from another person eating meals?: A Little Help from another person taking care of personal grooming?: A Little Help from another person toileting, which includes using toliet,  bedpan, or urinal?: A Lot Help from another person bathing (including washing, rinsing, drying)?: A Lot Help from another person to put on and taking off regular upper body clothing?: A Little Help from another person to put on and taking off regular lower body clothing?: A Lot 6 Click Score: 15   End of Session Equipment Utilized During Treatment: Rolling walker Nurse Communication: Mobility status (and void)  Activity Tolerance: Patient tolerated treatment well;Patient limited by pain (Limited by bowel/bladder incontinence at bedside.) Patient left: in bed;with nursing/sitter in room;with bed alarm set;with call bell/phone within reach  OT Visit Diagnosis: Unsteadiness on feet (R26.81);Pain (Decreased ADLs) Pain - part of body:  ("kidneys")                Time: 1914-7829 OT Time Calculation (min): 35 min Charges:  OT General Charges $OT Visit: 1 Visit OT Evaluation $OT Eval Low Complexity: 1 Low OT Treatments $Self Care/Home Management : 8-22 mins  Victorino Dike, OT Acute Rehab Services Office: [phone] 03/07/2021  Theodoro Clock 03/07/2021, 9:41 AM

## 2021-03-07 NOTE — Progress Notes (Signed)
Plan: OR tomorrow afternoon for I&D wrist, please keep NPO, please correct coagulopathy.

## 2021-03-07 NOTE — Progress Notes (Signed)
PT Cancellation Note  Patient Details Name: Greg Bean MRN: [id number] DOB: [birth year]   Cancelled Treatment:    Reason Eval/Treat Not Completed: Medical issues which prohibited therapy. Will check back 5/17.   Rada Hay 03/07/2021, 3:41 PM  Blanchard Kelch PT Acute Rehabilitation Services Pager [phone] Office [phone]

## 2021-03-07 NOTE — Progress Notes (Signed)
Subjective: The patient developed worsening gross hematuria overnight and what sounds like urinary retention due to a large amount of clot burden.  He states that he has only urinated a small amount over the past several hours.  Denies flank pain.  Significant drop in H&H.  Objective: Vital signs in last 24 hours: Temp:  [98.3 F (36.8 C)-100.5 F (38.1 C)] 99.3 F (37.4 C) (05/16 0745) Pulse Rate:  [100-124] 109 (05/16 0600) Resp:  [18-34] 29 (05/16 0100) BP: (127-190)/(65-100) 128/65 (05/16 0600) SpO2:  [93 %-100 %] 95 % (05/16 0600)  Intake/Output from previous day: 05/15 0701 - 05/16 0700 In: 1213.9 [P.O.:480; I.V.:403; IV Piggyback:330.9] Out: 375 [Urine:375]  Intake/Output this shift: Total I/O In: -  Out: 250 [Urine:250]  Physical Exam:  General: Alert and oriented   Lab Results: Recent Labs    03/07/21 0135 03/07/21 0637 03/07/21 0905  HGB 8.7* 7.0* 6.6*  HCT 25.4* 21.2* 19.4*   BMET Recent Labs    03/06/21 0329 03/07/21 0637  NA 133* 129*  K 3.7 3.9  CL 103 98  CO2 21* 24  GLUCOSE 122* 123*  BUN 31* 25*  CREATININE 1.07 1.22  CALCIUM 8.0* 7.9*     Studies/Results: DG Chest 2 View  Result Date: 03/07/2021 CLINICAL DATA:  Hypoxic respiratory failure, tachycardia and tachypnea in a 55 year old male. EXAM: CHEST - 2 VIEW COMPARISON:  Mar 04, 2021 FINDINGS: EKG leads project over the chest. Trachea midline. Cardiomediastinal contours and hilar structures are stable. Partially obscured LEFT hemidiaphragm in the retrocardiac region. Small LEFT effusion. Mildly increased interstitial markings. On limited assessment no acute skeletal process. IMPRESSION: Developing airspace disease at the LEFT lung base associated with effusion, may represent worsening atelectasis or developing infection. Increased interstitial markings slightly increased could represent sequela of heart failure or pneumonitis. Electronically Signed   By: Donzetta KohutGeoffrey  Wile M.D.   On: 03/07/2021  08:15   MR WRIST LEFT W WO CONTRAST  Result Date: 03/05/2021 CLINICAL DATA:  Acute on chronic left wrist pain.  MSSA bacteremia. EXAM: MR OF THE LEFT WRIST WITHOUT AND WITH CONTRAST TECHNIQUE: Multiplanar multisequence MR imaging of the left wrist was performed both before and after the administration of intravenous contrast. CONTRAST:  6mL GADAVIST GADOBUTROL 1 MMOL/ML IV SOLN COMPARISON:  Left wrist x-rays from same day. FINDINGS: Ligaments: Torn lunotriquetral ligament. Intact scapholunate ligament. Triangular fibrocartilage: Large tear of the articular disc. Tendons: Intact flexor and extensor compartment tendons. Small amount of fluid in the common flexor tendon sheath with mild synovial enhancement. Carpal tunnel/median nerve: Normal carpal tunnel. Normal median nerve. Guyon's canal: Normal. Joint/cartilage: Prominent radiocarpal joint space narrowing with extensive full-thickness cartilage loss over the proximal lunate and adjacent distal radius. Degenerative subchondral marrow edema in the volar distal radius centrally. No significant radiocarpal joint effusion. Small midcarpal and distal radioulnar joint complex effusions with thick synovial enhancement. Additional full-thickness cartilage loss over the ulnar aspect of the lunate with underlying subchondral cystic change. Mild first CMC joint osteoarthritis. Bones/carpal alignment: Positive ulnar variance. Normal carpal alignment. No suspicious bone lesion. Other: Focal edema and enlargement of the pronator quadratus muscle with small 1.0 x 0.5 x 1.6 cm rim enhancing fluid collection between the muscle and distal radius (series 5, image 10; series 10, image 17). Prominent fascial edema between the volar margin of the pronator quadratus muscle and flexor muscles (series 5, image 1). IMPRESSION: 1. Infectious myofasciitis of the volar wrist involving the pronator quadratus muscle with small 1.6 cm abscess between the  muscle and distal radius. 2. Small  midcarpal and distal radioulnar joint complex effusions with thick synovial enhancement, concerning for septic arthritis. No significant radiocarpal joint effusion. 3. Mild common flexor tenosynovitis, likely infectious in etiology given proximity to pronator quadratus myofasciitis. 4. 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 Electronically Signed   By: Obie Dredge M.D.   On: 03/05/2021 14:31   DG CHEST PORT 1 VIEW  Result Date: 03/07/2021 CLINICAL DATA:  Shortness of breath EXAM: PORTABLE CHEST 1 VIEW COMPARISON:  Three days ago FINDINGS: Normal heart size for technique. Possible vascular pedicle widening. Diffuse interstitial prominence with hazy density at both lung bases. Negative for pneumothorax. IMPRESSION: Vascular congestion and atelectasis at the lung bases. Electronically Signed   By: Marnee Spring M.D.   On: 03/07/2021 04:02   ECHOCARDIOGRAM COMPLETE  Result Date: 03/05/2021    ECHOCARDIOGRAM REPORT   Patient Name:   [facility] Date of Exam: 03/05/2021 Medical Rec #:  [id number]       Height:       69.0 in Accession #:    [id number]      Weight:       144.2 lb Date of Birth:  Jul 24, 1966       BSA:          1.798 m Patient Age:    54 years        BP:           138/76 mmHg Patient Gender: M               HR:           112 bpm. Exam Location:  Inpatient Procedure: 2D Echo, Cardiac Doppler and Color Doppler Indications:    Bacteremia R78.81  History:        Patient has no prior history of Echocardiogram examinations.  Sonographer:    Elmarie Shiley Dance Referring Phys: [id number] CORNELIUS N VAN DAM IMPRESSIONS  1. Left ventricular ejection fraction, by estimation, is 60 to 65%. The left ventricle has normal function. The left ventricle has no regional wall motion abnormalities. Left  ventricular diastolic parameters were normal.  2. Right ventricular systolic function is normal. The right ventricular size is normal. Tricuspid regurgitation signal is inadequate for assessing PA pressure.  3. Left atrial size was moderately dilated.  4. The mitral valve is normal in structure. Trivial mitral valve regurgitation.  5. The aortic valve is tricuspid. Aortic valve regurgitation is not visualized. No aortic stenosis is present.  6. Aortic dilatation noted. There is mild dilatation of the ascending aorta, measuring 38 mm.  7. Possible PFO, not well visualized  8. The inferior vena cava is normal in size with greater than 50% respiratory variability, suggesting right atrial pressure of 3 mmHg. Conclusion(s)/Recommendation(s): No vegetation seen. If high clinical suspicion for endocarditis, consider TEE. FINDINGS  Left Ventricle: Left ventricular ejection fraction, by estimation, is 60 to 65%. The left ventricle has normal function. The left ventricle has no regional wall motion abnormalities. The left ventricular internal cavity size was normal in size. There is  no left ventricular hypertrophy. Left ventricular diastolic parameters were normal. Right Ventricle: The right ventricular size is normal. No increase in right ventricular wall thickness. Right ventricular systolic function is normal. Tricuspid regurgitation signal is inadequate for assessing PA pressure. Left Atrium: Left atrial size  was moderately dilated. Right Atrium: Right atrial size was normal in size. Pericardium: There is no evidence of pericardial effusion. Mitral Valve: The mitral valve is normal in structure. Trivial mitral valve regurgitation. Tricuspid Valve: The tricuspid valve is normal in structure. Tricuspid valve regurgitation is trivial. Aortic Valve: The aortic valve is tricuspid. Aortic valve regurgitation is not visualized. No aortic stenosis is present. Pulmonic Valve: The pulmonic valve was not well visualized. Pulmonic  valve regurgitation is not visualized. Aorta: The aortic root is normal in size and structure and aortic dilatation noted. There is mild dilatation of the ascending aorta, measuring 38 mm. Venous: The inferior vena cava is normal in size with greater than 50% respiratory variability, suggesting right atrial pressure of 3 mmHg. IAS/Shunts: Evidence of atrial level shunting detected by color flow Doppler.  LEFT VENTRICLE PLAX 2D LVIDd:         5.00 cm  Diastology LVIDs:         2.80 cm  LV e' medial:    10.10 cm/s LV PW:         1.00 cm  LV E/e' medial:  13.9 LV IVS:        0.80 cm  LV e' lateral:   10.20 cm/s LVOT diam:     2.00 cm  LV E/e' lateral: 13.7 LV SV:         89 LV SV Index:   49 LVOT Area:     3.14 cm  RIGHT VENTRICLE             IVC RV Basal diam:  2.80 cm     IVC diam: 1.70 cm RV S prime:     18.20 cm/s TAPSE (M-mode): 2.4 cm LEFT ATRIUM             Index       RIGHT ATRIUM           Index LA diam:        5.50 cm 3.06 cm/m  RA Area:     13.20 cm LA Vol (A2C):   66.3 ml 36.88 ml/m RA Volume:   30.00 ml  16.69 ml/m LA Vol (A4C):   85.5 ml 47.56 ml/m LA Biplane Vol: 76.3 ml 42.44 ml/m  AORTIC VALVE LVOT Vmax:   153.00 cm/s LVOT Vmean:  107.000 cm/s LVOT VTI:    0.283 m  AORTA Ao Root diam: 3.40 cm Ao Asc diam:  3.80 cm MITRAL VALVE MV Area (PHT): 4.21 cm     SHUNTS MV Decel Time: 180 msec     Systemic VTI:  0.28 m MV E velocity: 140.00 cm/s  Systemic Diam: 2.00 cm MV A velocity: 133.00 cm/s MV E/A ratio:  1.05 Epifanio Lesches MD Electronically signed by Epifanio Lesches MD Signature Date/Time: 03/05/2021/2:38:22 PM    Final    CT Angio Abd/Pel w/ and/or w/o  Result Date: 03/05/2021 CLINICAL DATA:  Right renal hemorrhage, flank pain, hematuria EXAM: CTA ABDOMEN AND PELVIS WITH CONTRAST TECHNIQUE: Multidetector CT imaging of the abdomen and pelvis was performed using the standard protocol during bolus administration of intravenous contrast. Multiplanar reconstructed images and MIPs were  obtained and reviewed to evaluate the vascular anatomy. CONTRAST:  OMNIPAQUE IOHEXOL 350 MG/ML SOLN COMPARISON:  The previous day's study FINDINGS: VASCULAR Aorta: Normal caliber aorta without aneurysm, dissection, vasculitis or significant stenosis. Celiac: Partially calcified ostial plaque with short-segment stenosis of only mild severity, patent and unremarkable distally. SMA: Patent without evidence of aneurysm, dissection, vasculitis or significant  stenosis. Replaced right hepatic arterial supply, an anatomic variant. Renals: Single right, widely patent. No pseudoaneurysm, active extravasation, or other vascular lesion evident. Single left, with calcified ostial plaque, no high-grade stenosis, unremarkable distally. IMA: Patent without evidence of aneurysm, dissection, vasculitis or significant stenosis. Inflow: Calcified plaque at the aortic bifurcation without aneurysm or stenosis, unremarkable distally. Proximal Outflow: Mildly atheromatous, patent. Veins: Prominent left retroperitoneal splenorenal shunt. Small enhancing gastric varices are evident on venous phase. Portal vein patent. SMV patent. Splenic vein patent. Hepatic veins patent. Review of the MIP images confirms the above findings. NON-VASCULAR Lower chest: Dependent atelectasis/consolidation in the lung bases. No pleural or pericardial effusion. Hepatobiliary: Nodular liver contour. No focal lesion or biliary ductal dilatation. Gallbladder unremarkable. Pancreas: Unremarkable. No pancreatic ductal dilatation or surrounding inflammatory changes. Spleen: 11.2 cm maximum diameter.  No focal lesion. Adrenals/Urinary Tract: Adrenal glands unremarkable. Left kidney unremarkable. 6.3 cm right upper pole renal intraparenchymal hematoma (previously 5.9). No active arterial extravasation. No aneurysm or other evident etiology of the collection. 8 mm right lower pole collecting system calculus. No hydronephrosis. Urinary bladder physiologically  distended. Stomach/Bowel: Stomach, small bowel, and colon are decompressed. Lymphatic: No abdominal or pelvic adenopathy. Reproductive: Mild prostate enlargement with central coarse calcifications Other: No ascites.  No free air. Musculoskeletal: Moderate paraumbilical hernia containing only mesenteric fat. Regional bones unremarkable. IMPRESSION: 1. Minimal enlargement of right upper pole intraparenchymal renal hematoma, with no active extravasation, pseudoaneurysm, or other evident etiology. After resolution of hematoma, consider elective renal MR with contrast to exclude underlying lesion. 2. Nodular hepatic contour suggesting cirrhosis, with small esophageal varices and a large decompressive splenorenal venous shunt. 3. Right nephrolithiasis without hydronephrosis. Electronically Signed   By: Corlis Leak M.D.   On: 03/05/2021 14:38    Assessment/Plan: 55 year old male with a hemorrhagic mass involving the right kidney associated with gross hematuria along with endocarditis with bacteremia and septic emboli involving multiple organ systems  -22 French three-way Foley catheter ordered.  Start continuous bladder irrigation and hand irrigation of Foley catheter as needed catheter obstruction/clots. -Repeat CT pending to assess his right renal hematoma.  CT angio from 03/05/2021 was unremarkable. -Transfuse as needed -Will continue to monitor   LOS: 3 days   Rhoderick Moody, MD [facility] Pager: [phone]  03/07/2021, 10:41 AM

## 2021-03-07 NOTE — Progress Notes (Signed)
Patient ID: Greg Bean, male   DOB: 1966-02-16, 55 y.o.   MRN: [id number]   Wrist MRI reviewed with [facility] radiologist, Dr. Massie Bougie who felt collections were too small and viscous to allow for successful image guided aspiration.  D/W Dr. Izora Ribas who was in agreement and therefore we will NOT attempt image guided sampling at this time.  Katherina Right, MD Pager #: [phone]

## 2021-03-07 NOTE — Progress Notes (Signed)
PROGRESS NOTE  Greg Bean BZJ:[id number] DOB: [birth year]   PCP: Pcp, No  Patient is from: Home  DOA: 02/24/2021 LOS: 3  Chief complaints: Hematuria, confusion and flank pain  Brief Narrative / Interim history: 55 year old M with no PMH other than previous alcohol abuse and COVID-19 illness presenting with hematuria, flank pain, weight loss and confusion, and found to have SIRS, liver cirrhosis with splenic varices, coagulopathy, hyperbilirubinemia, ABLA and AKI.   Patient received 3 units of FFP, 10 mg of IV vitamin K and 1u pRBCs.  Started on IV ceftriaxone for SIRS, and IVF for AKI.  GI and urology consulted.   The next day, blood culture with MSSA.  MRI left wrist/forearm with small abscess and complex joint effusion concerning for septic arthritis.  ID and hand surgery consulted.  Antibiotics de-escalated to Ancef by ID.  TTE without significant finding.  Hand surgery recommended IR for tapping the x-rays but IR felt collection was too small and viscous for successful image guided aspiration.  Repeat blood culture NGTD.  Patient had frank hematuria the morning of 5/16.  Remained coagulopathic.  Hgb dropped. 2u pRBC, 2u FFP and IV Vit K 10 mg ordered.  Urology notified.  Started on CBI.   Subjective: Seen and examined earlier this morning.  Patient frank hematuria with fresh blood and blood clots earlier this morning.  Hgb dropped from 8.7-6.6.  INR remains elevated at 2.3.  He complains right flank pain.  Denies chest pain, shortness of breath, nausea, vomiting or lightheadedness.  Still with left wrist pain.   Objective: Vitals:   03/07/21 0400 03/07/21 0500 03/07/21 0600 03/07/21 0745  BP: (!) 148/71 (!) 148/70 128/65   Pulse: (!) 106 (!) 108 (!) 109   Resp:      Temp:    99.3 F (37.4 C)  TempSrc:      SpO2: 99% 99% 95%   Weight:      Height:        Intake/Output Summary (Last 24 hours) at 03/07/2021 1117 Last data filed at 03/07/2021 0935 Gross per 24 hour  Intake  673.3 ml  Output 625 ml  Net 48.3 ml   Filed Weights   03/09/2021 1451 03/05/21 0040  Weight: 60.3 kg 65.4 kg    Examination:  GENERAL: No apparent distress.  Nontoxic. HEENT: MMM.  Vision and hearing grossly intact.  NECK: Supple.  No apparent JVD.  RESP: 95% on 4 L.  No IWOB.  Bibasilar crackles. CVS: Tachycardic to 106.  Regular rhythm.  Heart sounds normal.  ABD/GI/GU: BS+. Abd soft, NTND.  MSK/EXT:  Moves extremities.  TTP to left wrist.  Limited ROM.  No chronic swelling of the retina. SKIN: no apparent skin lesion or wound NEURO: Awake and alert. Oriented fairly.  No apparent focal neuro deficit. PSYCH: Calm. Normal affect.   Procedures:  None  Microbiology summarized: OFBPZ-02 and influenza PCR nonreactive. Blood cultures with MSSA.  Assessment & Plan: Severe sepsis due to MSSA bacteremia/ left wrist septic arthritis/small left pronator abscess-POA.  Met criteria with fever, tachycardia, leukocytosis and multiorgan failure including coagulopathy, hyperbilirubinemia, encephalopathy and lactic acidosis.  Blood cultures with MSSA.  TTE without significant finding.  MRI wrist with complex effusion raising concern for septic arthritis and a small abscess.  Repeat blood culture NGTD. -ID and hand surgery following.  -Per IR/radiology-abscess is too small.  Left wrist effusion is a small and viscous for needle aspiration. -Ceftriaxone>> Ancef -Defer timing of PICC and antibiotic duration to ID  Acute hypoxic respiratory failure: Desaturated to 80s on 5/14.  Appears dry on exam although he has bibasilar crackles. Two view CXR with increased interstitial markings and a small left effusion -Wean oxygen as able. -Antibiotics as above. -Incentive spirometry  Acute blood loss anemia superimposed on anemia of chronic disease Renal hemorrhage/gross hematuria: Noted on initial CT with slight enlargement on repeat CT.  Now with gross hematuria and right flank pain.  Recent Labs     03/17/2021 1530 03/05/21 0824 03/06/21 0329 03/07/21 0135 03/07/21 0637 03/07/21 0905  HGB 7.8* 8.6* 8.3* 8.7* 7.0* 6.6*  -Received 1 unit on admit.  Will transfuse 2 unit today -FFP and vitamin K -Started on CBI cardiology -Follow CT abdomen and pelvis -Monitor H&H   Coagulopathy: INR 2.3.  Coagulopathy could be due to cirrhosis and sepsis -Received 3 units of FFP and IV vitamin K 10 mg x 1 on admission -Transfused 2 units of FFP and IV vitamin K 10 mg x 1 -Continue monitoring  Liver cirrhosis with splenic varices-improved.  Likely from alcohol.  Reportedly quit drinking about 6 months ago.  Acute hepatitis panel negative.  Low suspicion for SBP. -Appreciate input by GI.  No plan for EGD -Follow speciality labs  Elevated LFT/hyperbilirubinemia-improving. Recent Labs  Lab 03/13/2021 1619 03/16/2021 2127 03/05/21 0824 03/06/21 0329 03/07/21 0637  AST 76* 71* 64* 59* 70*  ALT 41 40 37 32 25  ALKPHOS 81 76 85 85 102  BILITOT 5.1* 4.7* 6.5* 5.2* 4.4*  PROT 8.3* 7.3 8.0 7.7 7.4  ALBUMIN 1.6* 1.4* 1.8* 1.6* 1.6*  -Management as above   Hepatic encephalopathy?-Oriented x4.  Ammonia 48> 47. -Continue lactulose 20 g 3 times daily-titrate for 2-3 bowel movements a day  AKI/azotemia: Resolved. Recent Labs    02/24/2021 1619 03/05/21 0824 03/06/21 0329 03/07/21 0637  BUN 41* 38* 31* 25*  CREATININE 1.50* 1.22 1.07 1.22  -Continue monitoring  Leukocytosis/bandemia: Likely due to #1.  Leukocytosis worse today. -Continue trending. -Antibiotics as above  Thrombocytopenia: Relatively stable.  Likely due to liver cirrhosis and acute illness Recent Labs  Lab 03/07/2021 1530 03/05/21 0824 03/06/21 0329 03/07/21 0135 03/07/21 0637 03/07/21 0905  PLT 62* 97* 103* 106* 98* 92*  -Continue monitoring  Debility/physical deconditioning -PT/OT  Patient needs resume home Body mass index is 21.29 kg/m. Nutrition Problem: Increased nutrient needs Etiology: acute  illness Signs/Symptoms: estimated needs Interventions: Ensure Enlive (each supplement provides 350kcal and 20 grams of protein),Prostat,MVI   DVT prophylaxis:  SCDs Start: 02/25/2021 2147  Code Status: Full code Family Communication: Updated patient's mother over the phone. Level of care: Stepdown Status is: Inpatient  Remains inpatient appropriate because:Hemodynamically unstable, Ongoing diagnostic testing needed not appropriate for outpatient work up, Unsafe d/c plan, IV treatments appropriate due to intensity of illness or inability to take PO and Inpatient level of care appropriate due to severity of illness   Dispo: The patient is from: Home              Anticipated d/c is to: Home              Patient currently is not medically stable to d/c.   Difficult to place patient No       Consultants:  Infectious disease Urology Gastroenterology Heart surgery Interventional radiology   Sch Meds:  Scheduled Meds: . (feeding supplement) PROSource Plus  30 mL Oral BID BM  . sodium chloride   Intravenous Once  . Chlorhexidine Gluconate Cloth  6 each Topical Daily  .  feeding supplement  237 mL Oral BID BM  . lactulose  20 g Oral BID  . mouth rinse  15 mL Mouth Rinse BID  . multivitamin with minerals  1 tablet Oral Daily   Continuous Infusions: .  ceFAZolin (ANCEF) IV Stopped (03/07/21 0831)  . phytonadione (VITAMIN K) IV 10 mg (03/07/21 1048)  . sodium chloride irrigation     PRN Meds:.fentaNYL (SUBLIMAZE) injection, ondansetron **OR** ondansetron (ZOFRAN) IV  Antimicrobials: Anti-infectives (From admission, onward)   Start     Dose/Rate Route Frequency Ordered Stop   03/05/21 1030  nafcillin injection 2 g  Status:  Discontinued        2 g Intravenous Every 4 hours 03/05/21 0917 03/05/21 0937   03/05/21 1030  ceFAZolin (ANCEF) IVPB 2g/100 mL premix  Status:  Discontinued        2 g 200 mL/hr over 30 Minutes Intravenous Every 8 hours 03/05/21 0938 03/05/21 0941    03/05/21 1030  ceFAZolin (ANCEF) IVPB 2g/100 mL premix        2 g 200 mL/hr over 30 Minutes Intravenous Every 8 hours 03/05/21 0950     03/05/2021 1530  cefTRIAXone (ROCEPHIN) 1 g in sodium chloride 0.9 % 100 mL IVPB  Status:  Discontinued        1 g 200 mL/hr over 30 Minutes Intravenous Every 24 hours 03/15/2021 1529 03/05/21 0915       I have personally reviewed the following labs and images: CBC: Recent Labs  Lab 02/20/2021 1530 03/05/21 0824 03/06/21 0329 03/07/21 0135 03/07/21 0637 03/07/21 0905  WBC 18.9* 19.0* 16.3* 17.0* 22.7* 22.7*  NEUTROABS 16.2*  --   --   --   --   --   HGB 7.8* 8.6* 8.3* 8.7* 7.0* 6.6*  HCT 23.2* 25.0* 24.7* 25.4* 21.2* 19.4*  MCV 91.0 89.9 91.5 90.7 93.8 91.9  PLT 62* 97* 103* 106* 98* 92*   BMP &GFR Recent Labs  Lab 02/25/2021 1619 03/05/21 0824 03/06/21 0329 03/07/21 0637  NA 126* 133* 133* 129*  K 3.9 3.8 3.7 3.9  CL 95* 101 103 98  CO2 22 24 21* 24  GLUCOSE 108* 101* 122* 123*  BUN 41* 38* 31* 25*  CREATININE 1.50* 1.22 1.07 1.22  CALCIUM 7.7* 8.2* 8.0* 7.9*  MG 2.0  --  1.9 1.7  PHOS  --   --  3.0 2.7   Estimated Creatinine Clearance: 63.3 mL/min (by C-G formula based on SCr of 1.22 mg/dL). Liver & Pancreas: Recent Labs  Lab 02/22/2021 1619 02/26/2021 2127 03/05/21 0824 03/06/21 0329 03/07/21 0637  AST 76* 71* 64* 59* 70*  ALT 41 40 37 32 25  ALKPHOS 81 76 85 85 102  BILITOT 5.1* 4.7* 6.5* 5.2* 4.4*  PROT 8.3* 7.3 8.0 7.7 7.4  ALBUMIN 1.6* 1.4* 1.8* 1.6* 1.6*   Recent Labs  Lab 02/24/2021 1619  LIPASE 47   Recent Labs  Lab 03/21/2021 2127 03/06/21 0329  AMMONIA 48* 47*   Diabetic: No results for input(s): HGBA1C in the last 72 hours. No results for input(s): GLUCAP in the last 168 hours. Cardiac Enzymes: No results for input(s): CKTOTAL, CKMB, CKMBINDEX, TROPONINI in the last 168 hours. No results for input(s): PROBNP in the last 8760 hours. Coagulation Profile: Recent Labs  Lab 03/03/2021 1530 03/05/21 0824  03/06/21 0329 03/07/21 0637  INR 2.7* 1.9* 2.2* 2.3*   Thyroid Function Tests: No results for input(s): TSH, T4TOTAL, FREET4, T3FREE, THYROIDAB in the last 72 hours. Lipid Profile:  No results for input(s): CHOL, HDL, LDLCALC, TRIG, CHOLHDL, LDLDIRECT in the last 72 hours. Anemia Panel: Recent Labs    02/23/2021 1530 03/19/2021 1902 03/05/21 0824  VITAMINB12  --   --  2,357*  FOLATE  --  11.0  --   FERRITIN  --   --  1,805*  TIBC  --   --  133*  IRON  --   --  109  RETICCTPCT 2.5  --   --    Urine analysis:    Component Value Date/Time   COLORURINE RED (A) 03/21/2021 1528   APPEARANCEUR HAZY (A) 02/23/2021 1528   LABSPEC 1.011 03/09/2021 1528   PHURINE 6.0 03/15/2021 1528   GLUCOSEU 50 (A) 03/20/2021 1528   HGBUR MODERATE (A) 02/22/2021 1528   BILIRUBINUR NEGATIVE 03/06/2021 [place] 03/02/2021 1528   PROTEINUR 100 (A) 03/17/2021 1528   NITRITE NEGATIVE 03/06/2021 [place] 03/07/2021 1528   Sepsis Labs: Invalid input(s): PROCALCITONIN, [place]  Microbiology: Recent Results (from the past 240 hour(s))  Resp Panel by RT-PCR (Flu A&B, Covid) Nasopharyngeal Swab     Status: None   Collection Time: 02/28/2021  3:28 PM   Specimen: Nasopharyngeal Swab; Nasopharyngeal(NP) swabs in vial transport medium  Result Value Ref Range Status   SARS Coronavirus 2 by RT PCR NEGATIVE NEGATIVE Final    Comment: (NOTE) SARS-CoV-2 target nucleic acids are NOT DETECTED.  The SARS-CoV-2 RNA is generally detectable in upper respiratory specimens during the acute phase of infection. The lowest concentration of SARS-CoV-2 viral copies this assay can detect is 138 copies/mL. A negative result does not preclude SARS-Cov-2 infection and should not be used as the sole basis for treatment or other patient management decisions. A negative result may occur with  improper specimen collection/handling, submission of specimen other than nasopharyngeal swab,  presence of viral mutation(s) within the areas targeted by this assay, and inadequate number of viral copies(<138 copies/mL). A negative result must be combined with clinical observations, patient history, and epidemiological information. The expected result is Negative.  Fact Sheet for Patients:  EntrepreneurPulse.com.au  Fact Sheet for Healthcare Providers:  IncredibleEmployment.be  This test is no t yet approved or cleared by the [place] FDA and  has been authorized for detection and/or diagnosis of SARS-CoV-2 by FDA under an Emergency Use Authorization (EUA). This EUA will remain  in effect (meaning this test can be used) for the duration of the COVID-19 declaration under Section 564(b)(1) of the Act, 21 U.S.C.section 360bbb-3(b)(1), unless the authorization is terminated  or revoked sooner.       Influenza A by PCR NEGATIVE NEGATIVE Final   Influenza B by PCR NEGATIVE NEGATIVE Final    Comment: (NOTE) The Xpert Xpress SARS-CoV-2/FLU/RSV plus assay is intended as an aid in the diagnosis of influenza from Nasopharyngeal swab specimens and should not be used as a sole basis for treatment. Nasal washings and aspirates are unacceptable for Xpert Xpress SARS-CoV-2/FLU/RSV testing.  Fact Sheet for Patients: EntrepreneurPulse.com.au  Fact Sheet for Healthcare Providers: IncredibleEmployment.be  This test is not yet approved or cleared by the [place] FDA and has been authorized for detection and/or diagnosis of SARS-CoV-2 by FDA under an Emergency Use Authorization (EUA). This EUA will remain in effect (meaning this test can be used) for the duration of the COVID-19 declaration under Section 564(b)(1) of the Act, 21 U.S.C. section 360bbb-3(b)(1), unless the authorization is terminated or revoked.  Performed at [facility], [place]  [address] [place], [place] [address]   Urine  culture     Status: Abnormal   Collection Time: 03/14/2021  3:28 PM   Specimen: In/Out Cath Urine  Result Value Ref Range Status   Specimen Description   Final    IN/OUT CATH URINE Performed at [facility] [address], [place], [place] [address]    Special Requests   Final    NONE Performed at [facility], [place] [address] [place], [place] [address]    Culture >=100,000 COLONIES/mL STAPHYLOCOCCUS AUREUS (A)  Final   Report Status 03/07/2021 FINAL  Final   Organism ID, Bacteria STAPHYLOCOCCUS AUREUS (A)  Final      Susceptibility   Staphylococcus aureus - MIC*    CIPROFLOXACIN <=0.5 SENSITIVE Sensitive     GENTAMICIN <=0.5 SENSITIVE Sensitive     NITROFURANTOIN <=16 SENSITIVE Sensitive     OXACILLIN <=0.25 SENSITIVE Sensitive     TETRACYCLINE >=16 RESISTANT Resistant     VANCOMYCIN 1 SENSITIVE Sensitive     TRIMETH/SULFA <=10 SENSITIVE Sensitive     CLINDAMYCIN <=0.25 SENSITIVE Sensitive     RIFAMPIN <=0.5 SENSITIVE Sensitive     Inducible Clindamycin NEGATIVE Sensitive     * >=100,000 COLONIES/mL STAPHYLOCOCCUS AUREUS  Blood Culture (routine x 2)     Status: Abnormal   Collection Time: 03/21/2021  3:30 PM   Specimen: BLOOD  Result Value Ref Range Status   Specimen Description   Final    BLOOD LEFT ARM Performed at [facility] [address] [place], [place] [address]    Special Requests   Final    BOTTLES DRAWN AEROBIC AND ANAEROBIC Blood Culture results may not be optimal due to an inadequate volume of blood received in culture bottles Performed at [facility] [address], [place], [place] [address]    Culture  Setup Time   Final    GRAM POSITIVE COCCI IN CLUSTERS IN BOTH AEROBIC AND ANAEROBIC BOTTLES CRITICAL RESULT CALLED TO, READ BACK BY AND VERIFIED WITH: SCOTT CHRISTY PHARMD '@0850'  03/05/21 EB Performed at [facility], [place] [address], [place], [place] [address]    Culture  STAPHYLOCOCCUS AUREUS (A)  Final   Report Status 03/07/2021 FINAL  Final   Organism ID, Bacteria STAPHYLOCOCCUS AUREUS  Final      Susceptibility   Staphylococcus aureus - MIC*    CIPROFLOXACIN <=0.5 SENSITIVE Sensitive     ERYTHROMYCIN <=0.25 SENSITIVE Sensitive     GENTAMICIN <=0.5 SENSITIVE Sensitive     OXACILLIN <=0.25 SENSITIVE Sensitive     TETRACYCLINE >=16 RESISTANT Resistant     VANCOMYCIN 1 SENSITIVE Sensitive     TRIMETH/SULFA <=10 SENSITIVE Sensitive     CLINDAMYCIN <=0.25 SENSITIVE Sensitive     RIFAMPIN <=0.5 SENSITIVE Sensitive     Inducible Clindamycin NEGATIVE Sensitive     * STAPHYLOCOCCUS AUREUS  Blood Culture ID Panel (Reflexed)     Status: Abnormal   Collection Time: 03/14/2021  3:30 PM  Result Value Ref Range Status   Enterococcus faecalis NOT DETECTED NOT DETECTED Final   Enterococcus Faecium NOT DETECTED NOT DETECTED Final   Listeria monocytogenes NOT DETECTED NOT DETECTED Final   Staphylococcus species DETECTED (A) NOT DETECTED Final    Comment: CRITICAL RESULT CALLED TO, READ BACK BY AND VERIFIED WITH: SCOTT CHRISTY PHARMD '@0850'  03/05/21 EB    Staphylococcus aureus (BCID) DETECTED (A) NOT DETECTED Final    Comment: CRITICAL RESULT CALLED TO, READ BACK  BY AND VERIFIED WITH: SCOTT CHRISTY PHARMD '@0850'  03/05/21 EB    Staphylococcus epidermidis NOT DETECTED NOT DETECTED Final   Staphylococcus lugdunensis NOT DETECTED NOT DETECTED Final   Streptococcus species NOT DETECTED NOT DETECTED Final   Streptococcus agalactiae NOT DETECTED NOT DETECTED Final   Streptococcus pneumoniae NOT DETECTED NOT DETECTED Final   Streptococcus pyogenes NOT DETECTED NOT DETECTED Final   A.calcoaceticus-baumannii NOT DETECTED NOT DETECTED Final   Bacteroides fragilis NOT DETECTED NOT DETECTED Final   Enterobacterales NOT DETECTED NOT DETECTED Final   Enterobacter cloacae complex NOT DETECTED NOT DETECTED Final   Escherichia coli NOT DETECTED NOT DETECTED Final   Klebsiella  aerogenes NOT DETECTED NOT DETECTED Final   Klebsiella oxytoca NOT DETECTED NOT DETECTED Final   Klebsiella pneumoniae NOT DETECTED NOT DETECTED Final   Proteus species NOT DETECTED NOT DETECTED Final   Salmonella species NOT DETECTED NOT DETECTED Final   Serratia marcescens NOT DETECTED NOT DETECTED Final   Haemophilus influenzae NOT DETECTED NOT DETECTED Final   Neisseria meningitidis NOT DETECTED NOT DETECTED Final   Pseudomonas aeruginosa NOT DETECTED NOT DETECTED Final   Stenotrophomonas maltophilia NOT DETECTED NOT DETECTED Final   Candida albicans NOT DETECTED NOT DETECTED Final   Candida auris NOT DETECTED NOT DETECTED Final   Candida glabrata NOT DETECTED NOT DETECTED Final   Candida krusei NOT DETECTED NOT DETECTED Final   Candida parapsilosis NOT DETECTED NOT DETECTED Final   Candida tropicalis NOT DETECTED NOT DETECTED Final   Cryptococcus neoformans/gattii NOT DETECTED NOT DETECTED Final   Meth resistant mecA/C and MREJ NOT DETECTED NOT DETECTED Final    Comment: Performed at [facility], [address] [place], [place] [address]  Blood Culture (routine x 2)     Status: Abnormal   Collection Time: 03/11/2021  3:33 PM   Specimen: BLOOD  Result Value Ref Range Status   Specimen Description   Final    BLOOD RIGHT ANTECUBITAL Performed at [facility] [address], [place], [place] [address]    Special Requests   Final    BOTTLES DRAWN AEROBIC AND ANAEROBIC Blood Culture results may not be optimal due to an inadequate volume of blood received in culture bottles Performed at [facility] [address] [place], [place] [address]    Culture  Setup Time   Final    GRAM POSITIVE COCCI IN CLUSTERS ANAEROBIC BOTTLE ONLY CRITICAL VALUE NOTED.  VALUE IS CONSISTENT WITH PREVIOUSLY REPORTED AND CALLED VALUE.    Culture (A)  Final    STAPHYLOCOCCUS AUREUS SUSCEPTIBILITIES PERFORMED ON PREVIOUS CULTURE WITHIN THE LAST 5  DAYS. Performed at [facility], [place] [address], [place], [place] [address]    Report Status 03/07/2021 FINAL  Final  MRSA PCR Screening     Status: None   Collection Time: 03/05/21  8:19 AM   Specimen: Nasal Mucosa; Nasopharyngeal  Result Value Ref Range Status   MRSA by PCR NEGATIVE NEGATIVE Final    Comment:        The GeneXpert MRSA Assay (FDA approved for NASAL specimens only), is one component of a comprehensive MRSA colonization surveillance program. It is not intended to diagnose MRSA infection nor to guide or monitor treatment for MRSA infections. Performed at [facility], [place] [address], [place], [place] [address]   Culture, blood (Routine X 2) w Reflex to ID Panel     Status: None (Preliminary result)   Collection Time: 03/05/21  2:22  PM   Specimen: BLOOD  Result Value Ref Range Status   Specimen Description   Final    BLOOD LEFT ANTECUBITAL Performed at [facility] [address] [place], [place] [address]    Special Requests   Final    BOTTLES DRAWN AEROBIC ONLY Blood Culture adequate volume Performed at [facility] [address] [place], [place] [address]    Culture   Final    NO GROWTH < 24 HOURS Performed at [facility] [address], [place], [place] [address]    Report Status PENDING  Incomplete  Culture, blood (Routine X 2) w Reflex to ID Panel     Status: None (Preliminary result)   Collection Time: 03/05/21  2:24 PM   Specimen: BLOOD LEFT HAND  Result Value Ref Range Status   Specimen Description   Final    BLOOD LEFT HAND Performed at [facility] [address] [place], [place] [address]    Special Requests   Final    BOTTLES DRAWN AEROBIC ONLY Blood Culture results may not be optimal due to an inadequate volume of blood received in culture bottles Performed at [facility] [address], [place], [place] [address]     Culture   Final    NO GROWTH < 24 HOURS Performed at [facility] [address], [place],  [address]    Report Status PENDING  Incomplete    Radiology Studies: DG Chest 2 View  Result Date: 03/07/2021 CLINICAL DATA:  Hypoxic respiratory failure, tachycardia and tachypnea in a 55 year old male. EXAM: CHEST - 2 VIEW COMPARISON:  Mar 04, 2021 FINDINGS: EKG leads project over the chest. Trachea midline. Cardiomediastinal contours and hilar structures are stable. Partially obscured LEFT hemidiaphragm in the retrocardiac region. Small LEFT effusion. Mildly increased interstitial markings. On limited assessment no acute skeletal process. IMPRESSION: Developing airspace disease at the LEFT lung base associated with effusion, may represent worsening atelectasis or developing infection. Increased interstitial markings slightly increased could represent sequela of heart failure or pneumonitis. Electronically Signed   By: Zetta Bills M.D.   On: 03/07/2021 08:15   DG CHEST PORT 1 VIEW  Result Date: 03/07/2021 CLINICAL DATA:  Shortness of breath EXAM: PORTABLE CHEST 1 VIEW COMPARISON:  Three days ago FINDINGS: Normal heart size for technique. Possible vascular pedicle widening. Diffuse interstitial prominence with hazy density at both lung bases. Negative for pneumothorax. IMPRESSION: Vascular congestion and atelectasis at the lung bases. Electronically Signed   By: Monte Fantasia M.D.   On: 03/07/2021 04:02      Janie Strothman T. Roscoe  If 7PM-7AM, please contact night-coverage [email or web address] 03/07/2021, 11:17 AM

## 2021-03-07 NOTE — Progress Notes (Addendum)
Pt is passing plum sized clots through his urethra. lg frequent amounts of bright red blood. MD aware.

## 2021-03-08 ENCOUNTER — Inpatient Hospital Stay (HOSPITAL_COMMUNITY): Payer: 59

## 2021-03-08 ENCOUNTER — Encounter (HOSPITAL_COMMUNITY): Payer: Self-pay | Admitting: Internal Medicine

## 2021-03-08 ENCOUNTER — Encounter (HOSPITAL_COMMUNITY): Admission: EM | Disposition: E | Payer: Self-pay | Source: Home / Self Care | Attending: Student

## 2021-03-08 ENCOUNTER — Inpatient Hospital Stay (HOSPITAL_COMMUNITY): Payer: 59 | Admitting: Certified Registered Nurse Anesthetist

## 2021-03-08 DIAGNOSIS — R7881 Bacteremia: Secondary | ICD-10-CM | POA: Diagnosis not present

## 2021-03-08 DIAGNOSIS — A419 Sepsis, unspecified organism: Secondary | ICD-10-CM | POA: Diagnosis not present

## 2021-03-08 DIAGNOSIS — B9561 Methicillin susceptible Staphylococcus aureus infection as the cause of diseases classified elsewhere: Secondary | ICD-10-CM | POA: Diagnosis not present

## 2021-03-08 DIAGNOSIS — N179 Acute kidney failure, unspecified: Secondary | ICD-10-CM | POA: Diagnosis not present

## 2021-03-08 DIAGNOSIS — D62 Acute posthemorrhagic anemia: Secondary | ICD-10-CM | POA: Diagnosis not present

## 2021-03-08 HISTORY — PX: I & D EXTREMITY: SHX5045

## 2021-03-08 LAB — BPAM RBC
Blood Product Expiration Date: 202205252359
Blood Product Expiration Date: 202206082359
Blood Product Expiration Date: 202206082359
ISSUE DATE / TIME: 202205132257
ISSUE DATE / TIME: 202205161234
ISSUE DATE / TIME: 202205161812
Unit Type and Rh: 1700
Unit Type and Rh: 7300
Unit Type and Rh: 7300

## 2021-03-08 LAB — BPAM FFP
Blood Product Expiration Date: 202205212359
Blood Product Expiration Date: 202205212359
ISSUE DATE / TIME: 202205161506
ISSUE DATE / TIME: 202205162143
Unit Type and Rh: 7300
Unit Type and Rh: 7300

## 2021-03-08 LAB — PROTIME-INR
INR: 1.9 — ABNORMAL HIGH (ref 0.8–1.2)
INR: 2.1 — ABNORMAL HIGH (ref 0.8–1.2)
INR: 2.1 — ABNORMAL HIGH (ref 0.8–1.2)
Prothrombin Time: 21.7 seconds — ABNORMAL HIGH (ref 11.4–15.2)
Prothrombin Time: 23.3 seconds — ABNORMAL HIGH (ref 11.4–15.2)
Prothrombin Time: 23.1 seconds — ABNORMAL HIGH (ref 11.4–15.2)

## 2021-03-08 LAB — PREPARE FRESH FROZEN PLASMA: Unit division: 0

## 2021-03-08 LAB — HEMOGLOBIN AND HEMATOCRIT, BLOOD
HCT: 19.9 % — ABNORMAL LOW (ref 39.0–52.0)
HCT: 20.5 % — ABNORMAL LOW (ref 39.0–52.0)
HCT: 24 % — ABNORMAL LOW (ref 39.0–52.0)
HCT: 24.3 % — ABNORMAL LOW (ref 39.0–52.0)
Hemoglobin: 6.7 g/dL — CL (ref 13.0–17.0)
Hemoglobin: 6.7 g/dL — CL (ref 13.0–17.0)
Hemoglobin: 8.2 g/dL — ABNORMAL LOW (ref 13.0–17.0)
Hemoglobin: 8.2 g/dL — ABNORMAL LOW (ref 13.0–17.0)

## 2021-03-08 LAB — COMPREHENSIVE METABOLIC PANEL
ALT: 21 U/L (ref 0–44)
AST: 64 U/L — ABNORMAL HIGH (ref 15–41)
Albumin: 1.7 g/dL — ABNORMAL LOW (ref 3.5–5.0)
Alkaline Phosphatase: 87 U/L (ref 38–126)
Anion gap: 7 (ref 5–15)
BUN: 21 mg/dL — ABNORMAL HIGH (ref 6–20)
CO2: 24 mmol/L (ref 22–32)
Calcium: 7.9 mg/dL — ABNORMAL LOW (ref 8.9–10.3)
Chloride: 100 mmol/L (ref 98–111)
Creatinine, Ser: 0.81 mg/dL (ref 0.61–1.24)
GFR, Estimated: >60 mL/min (ref >60)
Glucose, Bld: 106 mg/dL — ABNORMAL HIGH (ref 70–99)
Potassium: 4 mmol/L (ref 3.5–5.1)
Sodium: 131 mmol/L — ABNORMAL LOW (ref 135–145)
Total Bilirubin: 4.2 mg/dL — ABNORMAL HIGH (ref 0.3–1.2)
Total Protein: 7.2 g/dL (ref 6.5–8.1)

## 2021-03-08 LAB — CBC
HCT: 24.4 % — ABNORMAL LOW (ref 39.0–52.0)
Hemoglobin: 8.3 g/dL — ABNORMAL LOW (ref 13.0–17.0)
MCH: 31 pg (ref 26.0–34.0)
MCHC: 34 g/dL (ref 30.0–36.0)
MCV: 91 fL (ref 80.0–100.0)
Platelets: 81 10*3/uL — ABNORMAL LOW (ref 150–400)
RBC: 2.68 MIL/uL — ABNORMAL LOW (ref 4.22–5.81)
RDW: 15.8 % — ABNORMAL HIGH (ref 11.5–15.5)
WBC: 18.2 10*3/uL — ABNORMAL HIGH (ref 4.0–10.5)
nRBC: 0.2 % (ref 0.0–0.2)

## 2021-03-08 LAB — TYPE AND SCREEN
ABO/RH(D): B POS
Antibody Screen: NEGATIVE
Unit division: 0
Unit division: 0
Unit division: 0

## 2021-03-08 LAB — PHOSPHORUS: Phosphorus: 2.9 mg/dL (ref 2.5–4.6)

## 2021-03-08 LAB — APTT: aPTT: 33 seconds (ref 24–36)

## 2021-03-08 LAB — FIBRINOGEN: Fibrinogen: 252 mg/dL (ref 210–475)

## 2021-03-08 LAB — AMMONIA: Ammonia: 20 umol/L (ref 9–35)

## 2021-03-08 LAB — MAGNESIUM: Magnesium: 1.8 mg/dL (ref 1.7–2.4)

## 2021-03-08 LAB — BRAIN NATRIURETIC PEPTIDE: B Natriuretic Peptide: 394 pg/mL — ABNORMAL HIGH (ref 0.0–100.0)

## 2021-03-08 SURGERY — IRRIGATION AND DEBRIDEMENT EXTREMITY
Anesthesia: General | Laterality: Left

## 2021-03-08 MED ORDER — CHLORHEXIDINE GLUCONATE 0.12 % MT SOLN
15.0000 mL | OROMUCOSAL | Status: AC
  Administered 2021-03-08: 15 mL via OROMUCOSAL

## 2021-03-08 MED ORDER — DEXAMETHASONE SODIUM PHOSPHATE 10 MG/ML IJ SOLN
INTRAMUSCULAR | Status: AC
  Filled 2021-03-08: qty 1

## 2021-03-08 MED ORDER — SODIUM CHLORIDE 0.9 % IV SOLN: INTRAVENOUS | Status: DC | PRN

## 2021-03-08 MED ORDER — DROPERIDOL 2.5 MG/ML IJ SOLN: 0.6250 mg | Freq: Once | INTRAMUSCULAR | Status: DC | PRN

## 2021-03-08 MED ORDER — FENTANYL CITRATE (PF) 100 MCG/2ML IJ SOLN
25.0000 ug | INTRAMUSCULAR | Status: DC | PRN
  Administered 2021-03-08 (×2): 50 ug via INTRAVENOUS

## 2021-03-08 MED ORDER — PROPOFOL 10 MG/ML IV BOLUS
INTRAVENOUS | Status: DC | PRN
  Administered 2021-03-08: 120 mg via INTRAVENOUS

## 2021-03-08 MED ORDER — PHENYLEPHRINE HCL-NACL 10-0.9 MG/250ML-% IV SOLN
INTRAVENOUS | Status: DC | PRN
  Administered 2021-03-08: 50 ug/min via INTRAVENOUS

## 2021-03-08 MED ORDER — LACTATED RINGERS IV SOLN: INTRAVENOUS | Status: DC

## 2021-03-08 MED ORDER — ONDANSETRON HCL 4 MG/2ML IJ SOLN
INTRAMUSCULAR | Status: DC | PRN
  Administered 2021-03-08: 4 mg via INTRAVENOUS

## 2021-03-08 MED ORDER — ALBUMIN HUMAN 5 % IV SOLN: INTRAVENOUS | Status: DC | PRN

## 2021-03-08 MED ORDER — OXYCODONE HCL 5 MG/5ML PO SOLN
5.0000 mg | Freq: Once | ORAL | Status: DC | PRN
Start: 2021-03-08 — End: 2021-03-08

## 2021-03-08 MED ORDER — FENTANYL CITRATE (PF) 100 MCG/2ML IJ SOLN
INTRAMUSCULAR | Status: DC | PRN
  Administered 2021-03-08: 50 ug via INTRAVENOUS
  Administered 2021-03-08: 25 ug via INTRAVENOUS

## 2021-03-08 MED ORDER — PROPOFOL 10 MG/ML IV BOLUS
INTRAVENOUS | Status: AC
  Filled 2021-03-08: qty 20

## 2021-03-08 MED ORDER — LACTATED RINGERS IV SOLN: INTRAVENOUS | Status: DC | PRN

## 2021-03-08 MED ORDER — VITAMIN K1 10 MG/ML IJ SOLN
5.0000 mg | Freq: Once | INTRAVENOUS | Status: DC
  Filled 2021-03-08: qty 0.5

## 2021-03-08 MED ORDER — OXYCODONE HCL 5 MG PO TABS: 5.0000 mg | ORAL_TABLET | Freq: Once | ORAL | Status: DC | PRN

## 2021-03-08 MED ORDER — SODIUM CHLORIDE 0.9 % IV SOLN
INTRAVENOUS | Status: DC | PRN
  Administered 2021-03-08: 500 mL via INTRAVENOUS

## 2021-03-08 MED ORDER — MEPERIDINE HCL 50 MG/ML IJ SOLN: 6.2500 mg | INTRAMUSCULAR | Status: DC | PRN

## 2021-03-08 MED ORDER — IRRISEPT - 450ML BOTTLE WITH 0.05% CHG IN STERILE WATER, USP 99.95% OPTIME
TOPICAL | Status: DC | PRN
  Administered 2021-03-08: 450 mL

## 2021-03-08 MED ORDER — FENTANYL CITRATE (PF) 250 MCG/5ML IJ SOLN
INTRAMUSCULAR | Status: AC
  Filled 2021-03-08: qty 5

## 2021-03-08 MED ORDER — ONDANSETRON HCL 4 MG/2ML IJ SOLN
INTRAMUSCULAR | Status: AC
  Filled 2021-03-08: qty 2

## 2021-03-08 MED ORDER — FENTANYL CITRATE (PF) 100 MCG/2ML IJ SOLN
INTRAMUSCULAR | Status: AC
  Filled 2021-03-08: qty 2

## 2021-03-08 MED ORDER — MIDAZOLAM HCL 5 MG/5ML IJ SOLN
INTRAMUSCULAR | Status: DC | PRN
  Administered 2021-03-08: .5 mg via INTRAVENOUS

## 2021-03-08 MED ORDER — MIDAZOLAM HCL 2 MG/2ML IJ SOLN
INTRAMUSCULAR | Status: AC
  Filled 2021-03-08: qty 2

## 2021-03-08 MED ORDER — VITAMIN K1 10 MG/ML IJ SOLN
10.0000 mg | Freq: Once | INTRAVENOUS | Status: AC
  Administered 2021-03-08: 10 mg via INTRAVENOUS
  Filled 2021-03-08: qty 1

## 2021-03-08 MED ORDER — SODIUM CHLORIDE 0.9% IV SOLUTION: Freq: Once | INTRAVENOUS | Status: AC

## 2021-03-08 SURGICAL SUPPLY — 22 items
BNDG ELASTIC 4X5.8 VLCR STR LF (GAUZE/BANDAGES/DRESSINGS) ×2 IMPLANT
BNDG GAUZE ELAST 4 BULKY (GAUZE/BANDAGES/DRESSINGS) ×2 IMPLANT
CORD BIPOLAR FORCEPS 12FT (ELECTRODE) ×2 IMPLANT
COVER SURGICAL LIGHT HANDLE (MISCELLANEOUS) ×2 IMPLANT
CUFF TOURN SGL QUICK 18X4 (TOURNIQUET CUFF) ×2 IMPLANT
DRAIN CHANNEL 15F RND FF W/TCR (WOUND CARE) ×2 IMPLANT
DRAPE SURG 17X11 SM STRL (DRAPES) ×4 IMPLANT
EVACUATOR SILICONE 100CC (DRAIN) ×2 IMPLANT
GAUZE SPONGE 4X4 12PLY STRL (GAUZE/BANDAGES/DRESSINGS) ×2 IMPLANT
GLOVE BIOGEL M 8.0 STRL (GLOVE) ×4 IMPLANT
GLOVE SURG UNDER POLY LF SZ6.5 (GLOVE) ×2 IMPLANT
GLOVE SURG UNDER POLY LF SZ7 (GLOVE) ×2 IMPLANT
GLOVE SURG UNDER POLY LF SZ7.5 (GLOVE) ×2 IMPLANT
JET LAVAGE IRRISEPT WOUND (IRRIGATION / IRRIGATOR) ×2
KIT BASIN OR (CUSTOM PROCEDURE TRAY) ×2 IMPLANT
KIT TURNOVER KIT A (KITS) ×2 IMPLANT
LAVAGE JET IRRISEPT WOUND (IRRIGATION / IRRIGATOR) ×1 IMPLANT
NS IRRIG 1000ML POUR BTL (IV SOLUTION) ×2 IMPLANT
PACK ORTHO EXTREMITY (CUSTOM PROCEDURE TRAY) ×2 IMPLANT
STOCKINETTE 6  STRL (DRAPES) ×1
STOCKINETTE 6 STRL (DRAPES) ×1 IMPLANT
SUT ETHILON 4 0 PS 2 18 (SUTURE) ×2 IMPLANT

## 2021-03-08 NOTE — Evaluation (Signed)
Physical Therapy Evaluation Patient Details Name: Greg Bean MRN: [id number] DOB: Apr 15, 1966 Today's Date: 03/07/2021   History of Present Illness  55 year old male with medical history of alcohol abuse and COVID-19. He presented to the ED with hematuria, flank pain, weight loss, and confusion. He was dx with SIRS, liver cirrhosis with splenic varices, coagulopathy, hyperbilirubinemia, and AKI. MRI of L wrist and forearm showed small abscess and complex joint effusion concerning for septic arthritis,renal hematoma and gross hematuria.For I and D of left arm 02/21/2021  Clinical Impression  The patient very cautious of foley with irrigation. Patient did mobilize, stand and performed stnading exercises and short squats. Patient should progress to return to ambulation to return home. Pt admitted with above diagnosis.  Pt currently with functional limitations due to the deficits listed below (see PT Problem List). Pt will benefit from skilled PT to increase their independence and safety with mobility to allow discharge to the venue listed below.        Follow Up Recommendations Home health PT    Equipment Recommendations   (TBA, may not need)    Recommendations for Other Services       Precautions / Restrictions Precautions Precautions: Fall Precaution Comments: bladder irrigation      Mobility  Bed Mobility   Bed Mobility: Supine to Sit;Sit to Supine     Supine to sit: Min guard Sit to supine: Min guard   General bed mobility comments: patient guarding  foley throughout    Transfers   Equipment used: Rolling walker (2 wheeled) Transfers: Sit to/from Stand Sit to Stand: Min guard         General transfer comment: min guard to stand, holds onto RW with 1 hand.  Ambulation/Gait             General Gait Details: nt  Information systems manager [employer] (Stroke Patients Only)       Balance Overall balance assessment: Mild  deficits observed, not formally tested   Sitting balance-Leahy Scale: Good     Standing balance support: No upper extremity supported;Single extremity supported Standing balance-Leahy Scale: Fair                               Pertinent Vitals/Pain Pain Score: 2  Pain Location: foleyw/ irrigation ongoing, holds the tubing when mobilizing Pain Descriptors / Indicators: Discomfort Pain Intervention(s): Monitored during session    Home Living Family/patient expects to be discharged to:: Private residence Living Arrangements: Alone Available Help at Discharge: Family;Available 24 hours/day Type of Home: Apartment Home Access: Level entry     Home Layout: Two level;Bed/bath upstairs Home Equipment: None      Prior Function Level of Independence: Independent         Comments: Pt does not drive, and reports was let go of work for multiple absences due to illness. Mother can provide transportation.     Hand Dominance        Extremity/Trunk Assessment   Upper Extremity Assessment LUE Deficits / Details: LT wrist NT due to possible septic arthritis.    Lower Extremity Assessment Lower Extremity Assessment: Overall WFL for tasks assessed       Communication      Cognition Arousal/Alertness: Awake/alert Behavior During Therapy: Flat affect Overall Cognitive Status: No family/caregiver present to determine baseline cognitive functioning  General Comments: oriented to date,.very cautious about the foley and irrigation      General Comments      Exercises General Exercises - Lower Extremity Hip Flexion/Marching: AROM;10 reps;Both;Standing Mini-Sqauts: AROM;Both;10 reps   Assessment/Plan    PT Assessment Patient needs continued PT services  PT Problem List Decreased strength;Decreased knowledge of precautions;Decreased mobility;Decreased range of motion;Decreased activity tolerance;Decreased safety  awareness       PT Treatment Interventions DME instruction;Therapeutic activities;Gait training;Therapeutic exercise;Patient/family education;Functional mobility training;Stair training    PT Goals (Current goals can be found in the Care Plan section)  Acute Rehab PT Goals Patient Stated Goal: agreed to get up,  go home PT Goal Formulation: With patient Time For Goal Achievement: 03/22/21 Potential to Achieve Goals: Good    Frequency Min 3X/week   Barriers to discharge        Co-evaluation               AM-PAC PT "6 Clicks" Mobility  Outcome Measure Help needed turning from your back to your side while in a flat bed without using bedrails?: A Little Help needed moving from lying on your back to sitting on the side of a flat bed without using bedrails?: A Little Help needed moving to and from a bed to a chair (including a wheelchair)?: A Little Help needed standing up from a chair using your arms (e.g., wheelchair or bedside chair)?: A Little Help needed to walk in hospital room?: A Little Help needed climbing 3-5 steps with a railing? : A Lot 6 Click Score: 17    End of Session   Activity Tolerance: Patient tolerated treatment well Patient left: in bed;with call bell/phone within reach Nurse Communication: Mobility status PT Visit Diagnosis: Unsteadiness on feet (R26.81);Muscle weakness (generalized) (M62.81);Difficulty in walking, not elsewhere classified (R26.2)    Time: 1740-8144 PT Time Calculation (min) (ACUTE ONLY): 24 min   Charges:   PT Evaluation $PT Eval Low Complexity: 1 Low PT Treatments $Therapeutic Activity: 8-22 mins        Blanchard Kelch PT Acute Rehabilitation Services Pager [phone] Office [phone]   Rada Hay 03/22/2021, 1:16 PM

## 2021-03-08 NOTE — Progress Notes (Signed)
INR 1.9, ordered IV vitamin K 5 mg x 1.  Will repeat INR.

## 2021-03-08 NOTE — Anesthesia Procedure Notes (Signed)
Procedure Name: LMA Insertion Date/Time: 10-Mar-2021 7:11 PM Performed by: Illene Silver, CRNA Pre-anesthesia Checklist: Patient identified, Emergency Drugs available, Suction available and Patient being monitored Patient Re-evaluated:Patient Re-evaluated prior to induction Oxygen Delivery Method: Circle system utilized Preoxygenation: Pre-oxygenation with 100% oxygen Induction Type: IV induction LMA: LMA with gastric port inserted LMA Size: 4.0 Tube type: Oral Number of attempts: 1 Airway Equipment and Method: Oral airway Placement Confirmation: positive ETCO2 Tube secured with: Tape Dental Injury: Teeth and Oropharynx as per pre-operative assessment

## 2021-03-08 NOTE — Anesthesia Postprocedure Evaluation (Signed)
Anesthesia Post Note  Patient: Greg Bean  Procedure(s) Performed: IRRIGATION AND DEBRIDEMENT RIGHT WRIST (Left )     Patient location during evaluation: PACU Anesthesia Type: General Level of consciousness: awake and alert Pain management: pain level controlled Vital Signs Assessment: post-procedure vital signs reviewed and stable Respiratory status: spontaneous breathing, nonlabored ventilation and respiratory function stable Cardiovascular status: blood pressure returned to baseline and stable Postop Assessment: no apparent nausea or vomiting Anesthetic complications: no   No complications documented.  Last Vitals:  Vitals:   03-23-2021 2100 Mar 23, 2021 2105  BP: 113/66 110/66  Pulse: (!) 108 (!) 108  Resp: (!) 24 (!) 23  Temp:    SpO2: 90% 97%    Last Pain:  Vitals:   03-23-21 2100  TempSrc:   PainSc: 2                  Lucretia Kern

## 2021-03-08 NOTE — Plan of Care (Signed)
  Problem: Education: Goal: Knowledge of General Education information will improve Description: Including pain rating scale, medication(s)/side effects and non-pharmacologic comfort measures Outcome: Progressing   Problem: Health Behavior/Discharge Planning: Goal: Ability to manage health-related needs will improve Outcome: Progressing   Problem: Clinical Measurements: Goal: Ability to maintain clinical measurements within normal limits will improve Outcome: Progressing Goal: Will remain free from infection Outcome: Progressing Goal: Diagnostic test results will improve Outcome: Progressing Goal: Respiratory complications will improve Outcome: Progressing Goal: Cardiovascular complication will be avoided Outcome: Progressing   Problem: Activity: Goal: Risk for activity intolerance will decrease Outcome: Progressing   Problem: Nutrition: Goal: Adequate nutrition will be maintained Outcome: Progressing   Problem: Coping: Goal: Level of anxiety will decrease Outcome: Progressing   Problem: Elimination: Goal: Will not experience complications related to bowel motility Outcome: Progressing Goal: Will not experience complications related to urinary retention Outcome: Progressing   Problem: Pain Managment: Goal: General experience of comfort will improve Outcome: Progressing   Problem: Safety: Goal: Ability to remain free from injury will improve Outcome: Progressing   Problem: Skin Integrity: Goal: Risk for impaired skin integrity will decrease Outcome: Progressing   Problem: Fluid Volume: Goal: Hemodynamic stability will improve Outcome: Progressing   Problem: Clinical Measurements: Goal: Diagnostic test results will improve Outcome: Progressing Goal: Signs and symptoms of infection will decrease Outcome: Progressing   Problem: Respiratory: Goal: Ability to maintain adequate ventilation will improve Outcome: Progressing   Problem: Education: Goal:  Knowledge of disease and its progression will improve Outcome: Progressing   Problem: Health Behavior/Discharge Planning: Goal: Ability to manage health-related needs will improve Outcome: Progressing   Problem: Clinical Measurements: Goal: Complications related to the disease process or treatment will be avoided or minimized Outcome: Progressing Goal: Dialysis access will remain free of complications Outcome: Progressing   Problem: Activity: Goal: Activity intolerance will improve Outcome: Progressing   Problem: Fluid Volume: Goal: Fluid volume balance will be maintained or improved Outcome: Progressing   Problem: Nutritional: Goal: Ability to make appropriate dietary choices will improve Outcome: Progressing   Problem: Respiratory: Goal: Respiratory symptoms related to disease process will be avoided Outcome: Progressing   Problem: Self-Concept: Goal: Body image disturbance will be avoided or minimized Outcome: Progressing   Problem: Urinary Elimination: Goal: Progression of disease will be identified and treated Outcome: Progressing   

## 2021-03-08 NOTE — Progress Notes (Signed)
[facility] Progress Note  Greg Bean 55 y.o. November 06, 1965   Subjective: Resting in bed. Denies abdominal pain. Nonbloody loose stool overnight. Nurse in room.  Objective: Vital signs: Vitals:   03-16-21 0500 Mar 16, 2021 0600  BP: 124/71 126/76  Pulse: (!) 105 (!) 102  Resp: (!) 37 (!) 32  Temp:    SpO2: 97% 99%  T 99.2  Physical Exam: Gen: lethargic, chronically ill-appearing, thin, no acute distress  CV: RRR Chest: CTA B Abd: soft, nontender, nondistended, +BS, umbilical hernia Ext: no edema  Lab Results: Recent Labs    03/07/21 0637 03-16-21 0256  NA 129* 131*  K 3.9 4.0  CL 98 100  CO2 24 24  GLUCOSE 123* 106*  BUN 25* 21*  CREATININE 1.22 0.81  CALCIUM 7.9* 7.9*  MG 1.7 1.8  PHOS 2.7 2.9   Recent Labs    03/07/21 0637 03-16-21 0256  AST 70* 64*  ALT 25 21  ALKPHOS 102 87  BILITOT 4.4* 4.2*  PROT 7.4 7.2  ALBUMIN 1.6* 1.7*   Recent Labs    03/07/21 0905 03/07/21 1702 16-Mar-2021 0256  WBC 22.7*  --  18.2*  HGB 6.6* 7.0* 8.3*  8.2*  HCT 19.4* 20.7* 24.4*  24.3*  MCV 91.9  --  91.0  PLT 92*  --  81*      Assessment/Plan: Decompensated alcoholic cirrhosis - coagulopathy with Vit K to correct. Anemia multifactorial. No signs of GI bleeding. MSSA Bacteremia managed by ID. Urology managing hematuria. Surgery planning I/D of wrist. No new recs from GI standpoint. Continue supportive care. GI will sign off. Call us back if questions.   [name] 03-16-2021, 9:22 AM  Questions please call [phone] ID: Greg Bean, male   DOB: 23-Sep-1966, 55 y.o.   MRN: [id number]

## 2021-03-08 NOTE — Transfer of Care (Signed)
Immediate Anesthesia Transfer of Care Note  Patient: Greg Bean  Procedure(s) Performed: IRRIGATION AND DEBRIDEMENT RIGHT WRIST (Left )  Patient Location: PACU  Anesthesia Type:General  Level of Consciousness: awake, alert , oriented and patient cooperative  Airway & Oxygen Therapy: Patient Spontanous Breathing and Patient connected to face mask oxygen  Post-op Assessment: Report given to RN, Post -op Vital signs reviewed and stable and Patient moving all extremities X 4  Post vital signs: stable  Last Vitals:  Vitals Value Taken Time  BP 108/73 03/17/2021 2025  Temp    Pulse 106 03/12/2021 2025  Resp 23 03/06/2021 2025  SpO2 89 % 03/04/2021 2025  Vitals shown include unvalidated device data.  Last Pain:  Vitals:   03/16/2021 1844  TempSrc: Oral  PainSc:       Patients Stated Pain Goal: 0 (03/07/21 0252)  Complications: No complications documented.

## 2021-03-08 NOTE — Progress Notes (Signed)
Patient arrived back from short stay- surgery delayed. RN noted patient urine bag to be slow output. RN flushed foley catheter per PRN orders with 180 mL of sterile water. Clots removed and CBI restarted. Shortly after catheter reclotted. RN notified Alanda Slim, MD and RN to exchange foley catheter.   After foley catheter exchange, patient O2 88-90% on 3 L [place]. RN increased O2 to 6L [place]. Fine crackles noted in bilateral lung bases. Gonfa MD notified and orders placed for labs and chest x ray.

## 2021-03-08 NOTE — Progress Notes (Signed)
Urology Inpatient Progress Report      Intv/Subj: No acute events overnight.  Pt sleeping comfortably.  No problems with CBI overnight.  Hgb stable.  Pt receiving Vit K for INR.    Principal Problem:   MSSA bacteremia Active Problems:   Renal hemorrhage, right   SIRS (systemic inflammatory response syndrome) (HCC)   Cirrhosis of liver (HCC)   ABLA (acute blood loss anemia)   AKI (acute kidney injury) (HCC)   Anemia   Thrombocytopenia (HCC)   Staphylococcal arthritis of left wrist (HCC)  Current Facility-Administered Medications  Medication Dose Route Frequency Provider Last Rate Last Admin  . (feeding supplement) PROSource Plus liquid 30 mL  30 mL Oral BID BM Gonfa, Taye T, MD   30 mL at 03/07/21 1756  . ceFAZolin (ANCEF) IVPB 2g/100 mL premix  2 g Intravenous Q8H Rollene Fare, [place]   Stopped at 2021-03-31 4163  . Chlorhexidine Gluconate Cloth 2 % PADS 6 each  6 each Topical Daily Almon Hercules, MD   6 each at 2021/03/31 [phone]  . feeding supplement (ENSURE ENLIVE / ENSURE PLUS) liquid 237 mL  237 mL Oral BID BM Gonfa, Taye T, MD   237 mL at 03/06/21 2200  . fentaNYL (SUBLIMAZE) injection 25-50 mcg  25-50 mcg Intravenous Q2H PRN Hillary Bow, DO   50 mcg at 03/07/21 0252  . lactulose (CHRONULAC) 10 GM/15ML solution 20 g  20 g Oral BID Candelaria Stagers T, MD   20 g at 03/07/21 2300  . MEDLINE mouth rinse  15 mL Mouth Rinse BID Gonfa, Taye T, MD   15 mL at 03/31/2021 1026  . multivitamin with minerals tablet 1 tablet  1 tablet Oral Daily Almon Hercules, MD   1 tablet at 03/07/21 1055  . ondansetron (ZOFRAN) tablet 4 mg  4 mg Oral Q6H PRN Hillary Bow, DO       Or  . ondansetron [facility]) injection 4 mg  4 mg Intravenous Q6H PRN Hillary Bow, DO      . sodium chloride irrigation 0.9 % 3,000 mL  3,000 mL Irrigation Continuous Rene Paci, MD   3,000 mL at 2021-03-31 1025     Objective: Vital: Vitals:   03-31-2021 0800 03/31/21 0900 03/31/21 1000 March 31, 2021 1100  BP:  126/71 127/75 117/73 118/69  Pulse: (!) 102 100 100 97  Resp: (!) 30 (!) 31 (!) 21 (!) 28  Temp:      TempSrc:      SpO2: 96% 98% 100% 98%  Weight:      Height:       I/Os: I/O last 3 completed shifts: In: 29535.8 [MIWOE:3212; YQMGN:00370; IV Piggyback:271.8] Out: 48889 [Urine:23425]  Physical Exam:  General: Patient is in no apparent distress Lungs: Normal respiratory effort, chest expands symmetrically. GI: The abdomen is soft and nontender Foley: 22Fr 3 way foley draining pink tinged urine with CBI wide open Ext: lower extremities symmetric  Lab Results: Recent Labs    03/07/21 0637 03/07/21 0905 03/07/21 1702 03/31/21 0256 03/31/2021 1137  WBC 22.7* 22.7*  --  18.2*  --   HGB 7.0* 6.6* 7.0* 8.3*  8.2* 8.2*  HCT 21.2* 19.4* 20.7* 24.4*  24.3* 24.0*   Recent Labs    03/06/21 0329 03/07/21 0637 03/31/2021 0256  NA 133* 129* 131*  K 3.7 3.9 4.0  CL 103 98 100  CO2 21* 24 24  GLUCOSE 122* 123* 106*  BUN 31* 25* 21*  CREATININE 1.07 1.22  0.81  CALCIUM 8.0* 7.9* 7.9*   Recent Labs    03/07/21 0637 2020/12/28 0256 2020/12/28 1137  INR 2.3* 1.9* 2.1*   No results for input(s): LABURIN in the last 72 hours. Results for orders placed or performed during the hospital encounter of 03/11/2021  Resp Panel by RT-PCR (Flu A&B, Covid) Nasopharyngeal Swab     Status: None   Collection Time: 03/05/2021  3:28 PM   Specimen: Nasopharyngeal Swab; Nasopharyngeal(NP) swabs in vial transport medium  Result Value Ref Range Status   SARS Coronavirus 2 by RT PCR NEGATIVE NEGATIVE Final    Comment: (NOTE) SARS-CoV-2 target nucleic acids are NOT DETECTED.  The SARS-CoV-2 RNA is generally detectable in upper respiratory specimens during the acute phase of infection. The lowest concentration of SARS-CoV-2 viral copies this assay can detect is 138 copies/mL. A negative result does not preclude SARS-Cov-2 infection and should not be used as the sole basis for treatment or other patient  management decisions. A negative result may occur with  improper specimen collection/handling, submission of specimen other than nasopharyngeal swab, presence of viral mutation(s) within the areas targeted by this assay, and inadequate number of viral copies(<138 copies/mL). A negative result must be combined with clinical observations, patient history, and epidemiological information. The expected result is Negative.  Fact Sheet for Patients:  BloggerCourse.comhttps://www.fda.gov/media/152166/download  Fact Sheet for Healthcare Providers:  SeriousBroker.ithttps://www.fda.gov/media/152162/download  This test is no t yet approved or cleared by the [place] FDA and  has been authorized for detection and/or diagnosis of SARS-CoV-2 by FDA under an Emergency Use Authorization (EUA). This EUA will remain  in effect (meaning this test can be used) for the duration of the COVID-19 declaration under Section 564(b)(1) of the Act, 21 U.S.C.section 360bbb-3(b)(1), unless the authorization is terminated  or revoked sooner.       Influenza A by PCR NEGATIVE NEGATIVE Final   Influenza B by PCR NEGATIVE NEGATIVE Final    Comment: (NOTE) The Xpert Xpress SARS-CoV-2/FLU/RSV plus assay is intended as an aid in the diagnosis of influenza from Nasopharyngeal swab specimens and should not be used as a sole basis for treatment. Nasal washings and aspirates are unacceptable for Xpert Xpress SARS-CoV-2/FLU/RSV testing.  Fact Sheet for Patients: BloggerCourse.comhttps://www.fda.gov/media/152166/download  Fact Sheet for Healthcare Providers: SeriousBroker.ithttps://www.fda.gov/media/152162/download  This test is not yet approved or cleared by the [place] FDA and has been authorized for detection and/or diagnosis of SARS-CoV-2 by FDA under an Emergency Use Authorization (EUA). This EUA will remain in effect (meaning this test can be used) for the duration of the COVID-19 declaration under Section 564(b)(1) of the Act, 21 U.S.C. section 360bbb-3(b)(1),  unless the authorization is terminated or revoked.  Performed at [facility], [address] [place], [place] [address]   Urine culture     Status: Abnormal   Collection Time: 03/02/2021  3:28 PM   Specimen: In/Out Cath Urine  Result Value Ref Range Status   Specimen Description   Final    IN/OUT CATH URINE Performed at [facility], [address] [place], [place] [address]    Special Requests   Final    NONE Performed at [facility], [address] [place], [place] [address]    Culture >=100,000 COLONIES/mL STAPHYLOCOCCUS AUREUS (A)  Final   Report Status 03/07/2021 FINAL  Final   Organism ID, Bacteria STAPHYLOCOCCUS AUREUS (A)  Final      Susceptibility   Staphylococcus aureus - MIC*    CIPROFLOXACIN <=  0.5 SENSITIVE Sensitive     GENTAMICIN <=0.5 SENSITIVE Sensitive     NITROFURANTOIN <=16 SENSITIVE Sensitive     OXACILLIN <=0.25 SENSITIVE Sensitive     TETRACYCLINE >=16 RESISTANT Resistant     VANCOMYCIN 1 SENSITIVE Sensitive     TRIMETH/SULFA <=10 SENSITIVE Sensitive     CLINDAMYCIN <=0.25 SENSITIVE Sensitive     RIFAMPIN <=0.5 SENSITIVE Sensitive     Inducible Clindamycin NEGATIVE Sensitive     * >=100,000 COLONIES/mL STAPHYLOCOCCUS AUREUS  Blood Culture (routine x 2)     Status: Abnormal   Collection Time: 24-Mar-2021  3:30 PM   Specimen: BLOOD  Result Value Ref Range Status   Specimen Description   Final    BLOOD LEFT ARM Performed at [facility], [address], [place], [place] [address]    Special Requests   Final    BOTTLES DRAWN AEROBIC AND ANAEROBIC Blood Culture results may not be optimal due to an inadequate volume of blood received in culture bottles Performed at [facility], [address] [place], [place] [address]    Culture  Setup Time   Final    GRAM POSITIVE COCCI IN CLUSTERS IN BOTH AEROBIC AND ANAEROBIC BOTTLES CRITICAL RESULT CALLED TO, READ BACK  BY AND VERIFIED WITH: SCOTT CHRISTY PHARMD  03/05/21 EB Performed at [facility], [address] [place], [place] [address]    Culture STAPHYLOCOCCUS AUREUS (A)  Final   Report Status 03/07/2021 FINAL  Final   Organism ID, Bacteria STAPHYLOCOCCUS AUREUS  Final      Susceptibility   Staphylococcus aureus - MIC*    CIPROFLOXACIN <=0.5 SENSITIVE Sensitive     ERYTHROMYCIN <=0.25 SENSITIVE Sensitive     GENTAMICIN <=0.5 SENSITIVE Sensitive     OXACILLIN <=0.25 SENSITIVE Sensitive     TETRACYCLINE >=16 RESISTANT Resistant     VANCOMYCIN 1 SENSITIVE Sensitive     TRIMETH/SULFA <=10 SENSITIVE Sensitive     CLINDAMYCIN <=0.25 SENSITIVE Sensitive     RIFAMPIN <=0.5 SENSITIVE Sensitive     Inducible Clindamycin NEGATIVE Sensitive     * STAPHYLOCOCCUS AUREUS  Blood Culture ID Panel (Reflexed)     Status: Abnormal   Collection Time: 24-Mar-2021  3:30 PM  Result Value Ref Range Status   Enterococcus faecalis NOT DETECTED NOT DETECTED Final   Enterococcus Faecium NOT DETECTED NOT DETECTED Final   Listeria monocytogenes NOT DETECTED NOT DETECTED Final   Staphylococcus species DETECTED (A) NOT DETECTED Final    Comment: CRITICAL RESULT CALLED TO, READ BACK BY AND VERIFIED WITH: SCOTT CHRISTY PHARMD  03/05/21 EB    Staphylococcus aureus (BCID) DETECTED (A) NOT DETECTED Final    Comment: CRITICAL RESULT CALLED TO, READ BACK BY AND VERIFIED WITH: SCOTT CHRISTY PHARMD  03/05/21 EB    Staphylococcus epidermidis NOT DETECTED NOT DETECTED Final   Staphylococcus lugdunensis NOT DETECTED NOT DETECTED Final   Streptococcus species NOT DETECTED NOT DETECTED Final   Streptococcus agalactiae NOT DETECTED NOT DETECTED Final   Streptococcus pneumoniae NOT DETECTED NOT DETECTED Final   Streptococcus pyogenes NOT DETECTED NOT DETECTED Final   A.calcoaceticus-baumannii NOT DETECTED NOT DETECTED Final   Bacteroides fragilis NOT DETECTED NOT DETECTED Final   Enterobacterales NOT DETECTED NOT  DETECTED Final   Enterobacter cloacae complex NOT DETECTED NOT DETECTED Final   Escherichia coli NOT DETECTED NOT DETECTED Final   Klebsiella aerogenes NOT DETECTED NOT DETECTED Final   Klebsiella oxytoca NOT DETECTED NOT DETECTED Final   Klebsiella pneumoniae  NOT DETECTED NOT DETECTED Final   Proteus species NOT DETECTED NOT DETECTED Final   Salmonella species NOT DETECTED NOT DETECTED Final   Serratia marcescens NOT DETECTED NOT DETECTED Final   Haemophilus influenzae NOT DETECTED NOT DETECTED Final   Neisseria meningitidis NOT DETECTED NOT DETECTED Final   Pseudomonas aeruginosa NOT DETECTED NOT DETECTED Final   Stenotrophomonas maltophilia NOT DETECTED NOT DETECTED Final   Candida albicans NOT DETECTED NOT DETECTED Final   Candida auris NOT DETECTED NOT DETECTED Final   Candida glabrata NOT DETECTED NOT DETECTED Final   Candida krusei NOT DETECTED NOT DETECTED Final   Candida parapsilosis NOT DETECTED NOT DETECTED Final   Candida tropicalis NOT DETECTED NOT DETECTED Final   Cryptococcus neoformans/gattii NOT DETECTED NOT DETECTED Final   Meth resistant mecA/C and MREJ NOT DETECTED NOT DETECTED Final    Comment: Performed at [facility], [address] [place], [place] [address]  Blood Culture (routine x 2)     Status: Abnormal   Collection Time: 03/16/21  3:33 PM   Specimen: BLOOD  Result Value Ref Range Status   Specimen Description   Final    BLOOD RIGHT ANTECUBITAL Performed at [facility], [address], [place], [place] [address]    Special Requests   Final    BOTTLES DRAWN AEROBIC AND ANAEROBIC Blood Culture results may not be optimal due to an inadequate volume of blood received in culture bottles Performed at [facility], [address], [place], [place] [address]    Culture  Setup Time   Final    GRAM POSITIVE COCCI IN CLUSTERS ANAEROBIC BOTTLE ONLY CRITICAL VALUE NOTED.  VALUE IS CONSISTENT WITH PREVIOUSLY REPORTED  AND CALLED VALUE.    Culture (A)  Final    STAPHYLOCOCCUS AUREUS SUSCEPTIBILITIES PERFORMED ON PREVIOUS CULTURE WITHIN THE LAST 5 DAYS. Performed at [facility], [address] [place], [place] [address]    Report Status 03/07/2021 FINAL  Final  MRSA PCR Screening     Status: None   Collection Time: 03/05/21  8:19 AM   Specimen: Nasal Mucosa; Nasopharyngeal  Result Value Ref Range Status   MRSA by PCR NEGATIVE NEGATIVE Final    Comment:        The GeneXpert MRSA Assay (FDA approved for NASAL specimens only), is one component of a comprehensive MRSA colonization surveillance program. It is not intended to diagnose MRSA infection nor to guide or monitor treatment for MRSA infections. Performed at [facility], [address] [place], [place] [address]   Culture, blood (Routine X 2) w Reflex to ID Panel     Status: None (Preliminary result)   Collection Time: 03/05/21  2:22 PM   Specimen: BLOOD  Result Value Ref Range Status   Specimen Description   Final    BLOOD LEFT ANTECUBITAL Performed at [facility], [address], [place], [place] [address]    Special Requests   Final    BOTTLES DRAWN AEROBIC ONLY Blood Culture adequate volume Performed at [facility], [address], [place], [place] [address]    Culture   Final    NO GROWTH 3 DAYS Performed at [facility], [address], [place], [place] [address]    Report Status PENDING  Incomplete  Culture, blood (Routine X 2) w Reflex to ID Panel     Status: None (Preliminary result)   Collection Time: 03/05/21  2:24 PM  Specimen: BLOOD LEFT HAND  Result Value Ref Range Status   Specimen Description   Final    BLOOD LEFT HAND Performed at [facility], [address], [place], [place] [address]    Special Requests   Final    BOTTLES DRAWN AEROBIC ONLY Blood Culture results may not be optimal due to an inadequate volume of blood  received in culture bottles Performed at [facility], [address], [place], [place] [address]    Culture   Final    NO GROWTH 3 DAYS Performed at [facility], [address] [place], [place] [address]    Report Status PENDING  Incomplete    Studies/Results: CT ABDOMEN PELVIS WO CONTRAST  Result Date: 03/07/2021 CLINICAL DATA:  Hematuria.  Cirrhosis. EXAM: CT ABDOMEN AND PELVIS WITHOUT CONTRAST TECHNIQUE: Multidetector CT imaging of the abdomen and pelvis was performed following the standard protocol without IV contrast. COMPARISON:  03/05/2021 FINDINGS: Lower chest: Trace bilateral pleural effusions. Dependent bibasilar atelectasis. Heart size is normal. Relative hypoattenuation of the cardiac blood pool indicative of anemia. Trace pericardial effusion. Hepatobiliary: Nodular hepatic surface contour indicative of underlying cirrhosis. No liver lesion evident on unenhanced study. Pancreas: Unremarkable. No pancreatic ductal dilatation or surrounding inflammatory changes. Spleen: Normal in size without focal abnormality. Adrenals/Urinary Tract: Unremarkable adrenal glands. Mixed density intraparenchymal hematoma within the upper pole the right kidney measuring approximately 7.3 cm (remeasured at 6.7 cm in a similar plane on 03/05/2021 exam). 4 mm lower pole right renal stone. No hydronephrosis. Interval development of streaky areas of high attenuation within the subcapsular cortex of both kidneys (series 4, image 55). Urinary bladder wall appears mildly thickened, which may be accentuated by underdistention. Stomach/Bowel: Stomach is within normal limits. No dilated loops of small bowel. Mild long segment wall thickening of the ascending and proximal transverse colon, which may reflect underlying portal colopathy. Vascular/Lymphatic: Upper abdominal varices. Otherwise, no significant vascular findings are evident on non contrasted study. See recent CT angiogram. No enlarged  abdominal or pelvic lymph nodes. Reproductive: Prostate is unremarkable. Other: Small amount of free fluid located dependently within the pelvis, new from prior. No organized abdominopelvic fluid collection. Fat containing paraumbilical hernia. No free air. Musculoskeletal: No acute or significant osseous findings. IMPRESSION: 1. Interval development of streaky areas of high attenuation within the subcapsular cortex of both kidneys, which may represent multifocal hemorrhagic renal infarcts. 2. Continued slight enlargement of large intraparenchymal hemorrhage within the right kidney. Follow-up with outpatient contrast-enhanced abdominal MRI is recommended to assess for an underlying mass. 3. Nonobstructing 4 mm lower pole right renal stone. No hydronephrosis. 4. Mild long segment wall thickening of the ascending and proximal transverse colon, which may reflect underlying portal colopathy. 5. Small amount of free fluid located dependently within the pelvis, new from prior. 6. Trace bilateral pleural effusions and trace pericardial effusion. 7. Cirrhotic hepatic morphology. Electronically Signed   By: Duanne Guess D.O.   On: 03/07/2021 13:04   DG Chest 2 View  Result Date: 03/07/2021 CLINICAL DATA:  Hypoxic respiratory failure, tachycardia and tachypnea in a 55 year old male. EXAM: CHEST - 2 VIEW COMPARISON:  Mar 04, 2021 FINDINGS: EKG leads project over the chest. Trachea midline. Cardiomediastinal contours and hilar structures are stable. Partially obscured LEFT hemidiaphragm in the retrocardiac region. Small LEFT effusion. Mildly increased interstitial markings. On limited assessment no acute skeletal process. IMPRESSION: Developing airspace disease at the LEFT lung base associated with effusion, may represent worsening atelectasis or developing  infection. Increased interstitial markings slightly increased could represent sequela of heart failure or pneumonitis. Electronically Signed   By: Donzetta Kohut  M.D.   On: 03/07/2021 08:15   DG CHEST PORT 1 VIEW  Result Date: 03/07/2021 CLINICAL DATA:  Shortness of breath EXAM: PORTABLE CHEST 1 VIEW COMPARISON:  Three days ago FINDINGS: Normal heart size for technique. Possible vascular pedicle widening. Diffuse interstitial prominence with hazy density at both lung bases. Negative for pneumothorax. IMPRESSION: Vascular congestion and atelectasis at the lung bases. Electronically Signed   By: Marnee Spring M.D.   On: 03/07/2021 04:02    Assessment: 55 year old male with a hemorrhagic mass involving the right kidney associatedwith gross hematuria along with endocarditis with bacteremia and septic emboli involving multiple organ systems  -Continue CBI via 22 French three-way Foley catheter and hand irrigation of Foley catheter as needed catheter obstruction/clots. -Transfuse as needed -Will continue to monitor   Kasandra Knudsen, MD Urology 02/24/2021, 12:14 PM

## 2021-03-08 NOTE — Progress Notes (Signed)
INR 2.1, PT 23.3, Gonfa MD notified and 2 units of FFP placed.  RN also notified Surgery, Izora Ribas, MD due to pending I&D of Left Wrist. RN to hand plasma and prepare patient as planned at this time.

## 2021-03-08 NOTE — Progress Notes (Signed)
[facility]   Reason for visit: Follow up on disseminated MSSA infection  Interval History: repeat blood cultures ngtd; WBC down to 18.2, afebrile.  Plan for surgical debridement of wrist today.   Day 5 antibiotics Day 4 ceftriaxone  Physical Exam: Constitutional:  Vitals:   02/22/2021 1334 03/16/2021 1354  BP: 116/76 120/72  Pulse: 99 (!) 101  Resp: (!) 21 (!) 22  Temp: 98.1 F (36.7 C) 98.2 F (36.8 C)  SpO2: 99% 100%   patient appears in NAD Respiratory: Normal respiratory effort; CTA B Cardiovascular: RRR GI: soft, nt, nd MS: left wrist with some warmth, decreased rom  Review of Systems: Constitutional: negative for fevers and chills Integument/breast: negative for rash  Lab Results  Component Value Date   WBC 18.2 (H) 03/18/2021   HGB 8.2 (L) 03/16/2021   HCT 24.0 (L) 02/22/2021   MCV 91.0 02/28/2021   PLT 81 (L) 03/13/2021    Lab Results  Component Value Date   CREATININE 0.81 03/13/2021   BUN 21 (H) 02/21/2021   NA 131 (L) 03/06/2021   K 4.0 03/06/2021   CL 100 03/09/2021   CO2 24 03/12/2021    Lab Results  Component Value Date   ALT 21 03/05/2021   AST 64 (H) 03/21/2021   ALKPHOS 87 03/03/2021     Microbiology: Recent Results (from the past 240 hour(s))  Resp Panel by RT-PCR (Flu A&B, Covid) Nasopharyngeal Swab     Status: None   Collection Time: 03/12/2021  3:28 PM   Specimen: Nasopharyngeal Swab; Nasopharyngeal(NP) swabs in vial transport medium  Result Value Ref Range Status   SARS Coronavirus 2 by RT PCR NEGATIVE NEGATIVE Final    Comment: (NOTE) SARS-CoV-2 target nucleic acids are NOT DETECTED.  The SARS-CoV-2 RNA is generally detectable in upper respiratory specimens during the acute phase of infection. The lowest concentration of SARS-CoV-2 viral copies this assay can detect is 138 copies/mL. A negative result does not preclude SARS-Cov-2 infection and should not be used as the sole basis for treatment  or other patient management decisions. A negative result may occur with  improper specimen collection/handling, submission of specimen other than nasopharyngeal swab, presence of viral mutation(s) within the areas targeted by this assay, and inadequate number of viral copies(<138 copies/mL). A negative result must be combined with clinical observations, patient history, and epidemiological information. The expected result is Negative.  Fact Sheet for Patients:  BloggerCourse.com  Fact Sheet for Healthcare Providers:  SeriousBroker.it  This test is no t yet approved or cleared by the [place] FDA and  has been authorized for detection and/or diagnosis of SARS-CoV-2 by FDA under an Emergency Use Authorization (EUA). This EUA will remain  in effect (meaning this test can be used) for the duration of the COVID-19 declaration under Section 564(b)(1) of the Act, 21 U.S.C.section 360bbb-3(b)(1), unless the authorization is terminated  or revoked sooner.       Influenza A by PCR NEGATIVE NEGATIVE Final   Influenza B by PCR NEGATIVE NEGATIVE Final    Comment: (NOTE) The Xpert Xpress SARS-CoV-2/FLU/RSV plus assay is intended as an aid in the diagnosis of influenza from Nasopharyngeal swab specimens and should not be used as a sole basis for treatment. Nasal washings and aspirates are unacceptable for Xpert Xpress SARS-CoV-2/FLU/RSV testing.  Fact Sheet for Patients: BloggerCourse.com  Fact Sheet for Healthcare Providers: SeriousBroker.it  This test is not yet approved or cleared by the [place] FDA and has  been authorized for detection and/or diagnosis of SARS-CoV-2 by FDA under an Emergency Use Authorization (EUA). This EUA will remain in effect (meaning this test can be used) for the duration of the COVID-19 declaration under Section 564(b)(1) of the Act, 21 U.S.C. section  360bbb-3(b)(1), unless the authorization is terminated or revoked.  Performed at [facility], [address] [place], [place] [address]   Urine culture     Status: Abnormal   Collection Time: 03-15-2021  3:28 PM   Specimen: In/Out Cath Urine  Result Value Ref Range Status   Specimen Description   Final    IN/OUT CATH URINE Performed at [facility], [address] [place], [place] [address]    Special Requests   Final    NONE Performed at [facility], [address] [place], [place] [address]    Culture >=100,000 COLONIES/mL STAPHYLOCOCCUS AUREUS (A)  Final   Report Status 03/07/2021 FINAL  Final   Organism ID, Bacteria STAPHYLOCOCCUS AUREUS (A)  Final      Susceptibility   Staphylococcus aureus - MIC*    CIPROFLOXACIN <=0.5 SENSITIVE Sensitive     GENTAMICIN <=0.5 SENSITIVE Sensitive     NITROFURANTOIN <=16 SENSITIVE Sensitive     OXACILLIN <=0.25 SENSITIVE Sensitive     TETRACYCLINE >=16 RESISTANT Resistant     VANCOMYCIN 1 SENSITIVE Sensitive     TRIMETH/SULFA <=10 SENSITIVE Sensitive     CLINDAMYCIN <=0.25 SENSITIVE Sensitive     RIFAMPIN <=0.5 SENSITIVE Sensitive     Inducible Clindamycin NEGATIVE Sensitive     * >=100,000 COLONIES/mL STAPHYLOCOCCUS AUREUS  Blood Culture (routine x 2)     Status: Abnormal   Collection Time: 03/15/2021  3:30 PM   Specimen: BLOOD  Result Value Ref Range Status   Specimen Description   Final    BLOOD LEFT ARM Performed at [facility], [address], [place], [place] [address]    Special Requests   Final    BOTTLES DRAWN AEROBIC AND ANAEROBIC Blood Culture results may not be optimal due to an inadequate volume of blood received in culture bottles Performed at [facility], [address], [place], [place] [address]    Culture  Setup Time   Final    GRAM POSITIVE COCCI IN CLUSTERS IN BOTH AEROBIC AND ANAEROBIC BOTTLES CRITICAL RESULT  CALLED TO, READ BACK BY AND VERIFIED WITH: SCOTT CHRISTY PHARMD @0850  03/05/21 EB Performed at [facility], [address], [place], [place]    Culture STAPHYLOCOCCUS AUREUS (A)  Final   Report Status 03/07/2021 FINAL  Final   Organism ID, Bacteria STAPHYLOCOCCUS AUREUS  Final      Susceptibility   Staphylococcus aureus - MIC*    CIPROFLOXACIN <=0.5 SENSITIVE Sensitive     ERYTHROMYCIN <=0.25 SENSITIVE Sensitive     GENTAMICIN <=0.5 SENSITIVE Sensitive     OXACILLIN <=0.25 SENSITIVE Sensitive     TETRACYCLINE >=16 RESISTANT Resistant     VANCOMYCIN 1 SENSITIVE Sensitive     TRIMETH/SULFA <=10 SENSITIVE Sensitive     CLINDAMYCIN <=0.25 SENSITIVE Sensitive     RIFAMPIN <=0.5 SENSITIVE Sensitive     Inducible Clindamycin NEGATIVE Sensitive     * STAPHYLOCOCCUS AUREUS  Blood Culture ID Panel (Reflexed)     Status: Abnormal   Collection Time: 2021-03-15  3:30 PM  Result Value Ref Range Status   Enterococcus faecalis NOT DETECTED NOT DETECTED Final   Enterococcus Faecium NOT DETECTED NOT  DETECTED Final   Listeria monocytogenes NOT DETECTED NOT DETECTED Final   Staphylococcus species DETECTED (A) NOT DETECTED Final    Comment: CRITICAL RESULT CALLED TO, READ BACK BY AND VERIFIED WITH: SCOTT CHRISTY PHARMD @0850  03/05/21 EB    Staphylococcus aureus (BCID) DETECTED (A) NOT DETECTED Final    Comment: CRITICAL RESULT CALLED TO, READ BACK BY AND VERIFIED WITH: SCOTT CHRISTY PHARMD @0850  03/05/21 EB    Staphylococcus epidermidis NOT DETECTED NOT DETECTED Final   Staphylococcus lugdunensis NOT DETECTED NOT DETECTED Final   Streptococcus species NOT DETECTED NOT DETECTED Final   Streptococcus agalactiae NOT DETECTED NOT DETECTED Final   Streptococcus pneumoniae NOT DETECTED NOT DETECTED Final   Streptococcus pyogenes NOT DETECTED NOT DETECTED Final   A.calcoaceticus-baumannii NOT DETECTED NOT DETECTED Final   Bacteroides fragilis NOT DETECTED NOT DETECTED Final    Enterobacterales NOT DETECTED NOT DETECTED Final   Enterobacter cloacae complex NOT DETECTED NOT DETECTED Final   Escherichia coli NOT DETECTED NOT DETECTED Final   Klebsiella aerogenes NOT DETECTED NOT DETECTED Final   Klebsiella oxytoca NOT DETECTED NOT DETECTED Final   Klebsiella pneumoniae NOT DETECTED NOT DETECTED Final   Proteus species NOT DETECTED NOT DETECTED Final   Salmonella species NOT DETECTED NOT DETECTED Final   Serratia marcescens NOT DETECTED NOT DETECTED Final   Haemophilus influenzae NOT DETECTED NOT DETECTED Final   Neisseria meningitidis NOT DETECTED NOT DETECTED Final   Pseudomonas aeruginosa NOT DETECTED NOT DETECTED Final   Stenotrophomonas maltophilia NOT DETECTED NOT DETECTED Final   Candida albicans NOT DETECTED NOT DETECTED Final   Candida auris NOT DETECTED NOT DETECTED Final   Candida glabrata NOT DETECTED NOT DETECTED Final   Candida krusei NOT DETECTED NOT DETECTED Final   Candida parapsilosis NOT DETECTED NOT DETECTED Final   Candida tropicalis NOT DETECTED NOT DETECTED Final   Cryptococcus neoformans/gattii NOT DETECTED NOT DETECTED Final   Meth resistant mecA/C and MREJ NOT DETECTED NOT DETECTED Final    Comment: Performed at [facility], [address], [place], [address] [place]  Blood Culture (routine x 2)     Status: Abnormal   Collection Time: 03-31-21  3:33 PM   Specimen: BLOOD  Result Value Ref Range Status   Specimen Description   Final    BLOOD RIGHT ANTECUBITAL Performed at [facility], [address] [place], [place]    Special Requests   Final    BOTTLES DRAWN AEROBIC AND ANAEROBIC Blood Culture results may not be optimal due to an inadequate volume of blood received in culture bottles Performed at [facility], [address], [place], [place]    Culture  Setup Time   Final    GRAM POSITIVE COCCI IN CLUSTERS ANAEROBIC BOTTLE ONLY CRITICAL VALUE NOTED.  VALUE IS  CONSISTENT WITH PREVIOUSLY REPORTED AND CALLED VALUE.    Culture (A)  Final    STAPHYLOCOCCUS AUREUS SUSCEPTIBILITIES PERFORMED ON PREVIOUS CULTURE WITHIN THE LAST 5 DAYS. Performed at [facility], [address] [place], [address] [place]    Report Status 03/07/2021 FINAL  Final  MRSA PCR Screening     Status: None   Collection Time: 03/05/21  8:19 AM   Specimen: Nasal Mucosa; Nasopharyngeal  Result Value Ref Range Status   MRSA by PCR NEGATIVE NEGATIVE Final    Comment:        The GeneXpert MRSA Assay (FDA approved for NASAL specimens only), is one component of a comprehensive  MRSA colonization surveillance program. It is not intended to diagnose MRSA infection nor to guide or monitor treatment for MRSA infections. Performed at [facility], [address] [place], [place] [address]   Culture, blood (Routine X 2) w Reflex to ID Panel     Status: None (Preliminary result)   Collection Time: 03/05/21  2:22 PM   Specimen: BLOOD  Result Value Ref Range Status   Specimen Description   Final    BLOOD LEFT ANTECUBITAL Performed at [facility], [address] [place], [place] [address]    Special Requests   Final    BOTTLES DRAWN AEROBIC ONLY Blood Culture adequate volume Performed at [facility], [address] [place], [place] [address]    Culture   Final    NO GROWTH 3 DAYS Performed at [facility], [address] [place], [place] [address]    Report Status PENDING  Incomplete  Culture, blood (Routine X 2) w Reflex to ID Panel     Status: None (Preliminary result)   Collection Time: 03/05/21  2:24 PM   Specimen: BLOOD LEFT HAND  Result Value Ref Range Status   Specimen Description   Final    BLOOD LEFT HAND Performed at [facility], [address] [place], [place] [address]    Special Requests   Final    BOTTLES DRAWN AEROBIC ONLY Blood Culture results may not be optimal  due to an inadequate volume of blood received in culture bottles Performed at [facility], [address] [place], [place] [address]    Culture   Final    NO GROWTH 3 DAYS Performed at [facility], [address] [place], [place] [address]    Report Status PENDING  Incomplete    Impression/Plan:  1. MSSA bacteremia - repeat blood cultures ngtd.  Plan remains to continue for 6 weeks with IV antibiotics due to presumed disseminated infection and emboli.    2.  Wrist septic arthritis- for operative management today   3.  Hemorrhagic mass of kidney - hgb today stable at 8.3.  Still with some blood in urine and followed by urology.

## 2021-03-08 NOTE — Anesthesia Preprocedure Evaluation (Addendum)
Anesthesia Evaluation  Patient identified by MRN, date of birth, ID band Patient awake    Reviewed: Allergy & Precautions, NPO status , Patient's Chart, lab work & pertinent test results, Unable to perform ROS - Chart review only  Airway Mallampati: II  TM Distance: >3 FB Neck ROM: Full    Dental  (+) Dental Advisory Given   Pulmonary neg pulmonary ROS,    Pulmonary exam normal        Cardiovascular negative cardio ROS Normal cardiovascular exam  Echo 03/05/2021  1. Left ventricular ejection fraction, by estimation, is 60 to 65%. The left ventricle has normal function. The left ventricle has no regional wall motion abnormalities. Left ventricular diastolic parameters were normal.  2. Right ventricular systolic function is normal. The right ventricular size is normal. Tricuspid regurgitation signal is inadequate for assessing PA pressure.  3. Left atrial size was moderately dilated.  4. The mitral valve is normal in structure. Trivial mitral valve regurgitation.  5. The aortic valve is tricuspid. Aortic valve regurgitation is not visualized. No aortic stenosis is present.  6. Aortic dilatation noted. There is mild dilatation of the ascending aorta, measuring 38 mm.  7. Possible PFO, not well visualized  8. The inferior vena cava is normal in size with greater than 50% respiratory variability, suggesting right atrial pressure of 3 mmHg.    Neuro/Psych negative neurological ROS     GI/Hepatic negative GI ROS, (+) Cirrhosis       ,   Endo/Other  negative endocrine ROS  Renal/GU Renal disease (hemorrhagic renal mass)     Musculoskeletal  (+) Arthritis ,   Abdominal   Peds  Hematology  (+) anemia , S/p 3U PRBC, 7U FFP INR 1.9   Anesthesia Other Findings  55 year old M with no PMH other than previous alcohol abuse and COVID-19 illness presenting with hematuria, flank pain, weight loss and confusion, and found to  have SIRS, liver cirrhosis with splenic varices, coagulopathy, hyperbilirubinemia, AKI, ABLA due to renal hematoma and gross hematuria. Found to have left wrist septic arthritis.  Reproductive/Obstetrics                          Anesthesia Physical Anesthesia Plan  ASA: IV and emergent  Anesthesia Plan: General   Post-op Pain Management:    Induction: Intravenous  PONV Risk Score and Plan: 2 and Ondansetron, Dexamethasone, Treatment may vary due to age or medical condition and Midazolam  Airway Management Planned: LMA  Additional Equipment:   Intra-op Plan:   Post-operative Plan: Extubation in OR  Informed Consent: I have reviewed the patients History and Physical, chart, labs and discussed the procedure including the risks, benefits and alternatives for the proposed anesthesia with the patient or authorized representative who has indicated his/her understanding and acceptance.     Dental advisory given  Plan Discussed with:   Anesthesia Plan Comments:       Anesthesia Quick Evaluation

## 2021-03-08 NOTE — Progress Notes (Signed)
Critical hemoglobin 6.7. Alanda Slim MD notified that patient already in OR. RN discussed with OR RNs lab values and to transfuse in OR

## 2021-03-08 NOTE — Progress Notes (Signed)
PROGRESS NOTE  Greg Bean LPF:[id number] DOB: [birth year]   PCP: Pcp, No  Patient is from: Home  DOA: 03/19/2021 LOS: 4  Chief complaints: Hematuria, confusion and flank pain  Brief Narrative / Interim history: 55 year old M with no PMH other than previous alcohol abuse and COVID-19 illness presenting with hematuria, flank pain, weight loss and confusion, and found to have SIRS, liver cirrhosis with splenic varices, coagulopathy, hyperbilirubinemia, AKI, ABLA due to renal hematoma and gross hematuria.   Patient received 3 units of PRBC, 7 units of FFP and multiple lines of IV vitamin K for coagulopathy.  Started on IV ceftriaxone for SIRS, and IVF for AKI.  GI and urology consulted.   Blood culture with MSSA.  MRI left wrist with small abscess and complex joint effusion concerning for septic arthritis. TTE without vegetation or significant finding. ID de-escalated antibiotic from ceftriaxone to Ancef and recommending a minimum of 6 weeks from negative blood culture.  Repeat blood culture NGTD.  On surgery to take patient to the OR for his septic arthritis.  On CBI per urology   Subjective: Seen and examined earlier this morning.  No major events overnight of this morning.  Remains on CBI.  Light pink urine in Foley bag.  Patient has no complaints today.  INR remains elevated at 1.9.  He denies chest pain, shortness of breath, abdominal pain or fever.  Wrist pain seems to have improved.  Objective: Vitals:   03/03/2021 0800 03/06/2021 0900 03/11/2021 1000 03/07/2021 1100  BP: 126/71 127/75 117/73 118/69  Pulse: (!) 102 100 100 97  Resp: (!) 30 (!) 31 (!) 21 (!) 28  Temp:      TempSrc:      SpO2: 96% 98% 100% 98%  Weight:      Height:        Intake/Output Summary (Last 24 hours) at 03/17/2021 1242 Last data filed at 02/21/2021 1140 Gross per 24 hour  Intake 29685.25 ml  Output 40250 ml  Net -10564.75 ml   Filed Weights   03/12/2021 1451 03/05/21 0040  Weight: 60.3 kg 65.4 kg     Examination:  GENERAL: No apparent distress.  Nontoxic. HEENT: MMM.  Vision and hearing grossly intact.  NECK: Supple.  No apparent JVD.  RESP: 99% on 3 L.  No IWOB.  Bibasilar crackles. CVS:  RRR. Heart sounds normal.  ABD/GI/GU: BS+. Abd soft, NTND.  MSK/EXT:  Moves extremities.  TTP on left wrist.  Limited ROM.  No significant swelling or erythema. SKIN: no apparent skin lesion or wound NEURO: Awake and alert. Oriented appropriately.  No apparent focal neuro deficit. PSYCH: Calm. Normal affect.  Procedures:  None  Microbiology summarized: [id number] and influenza PCR nonreactive. Blood and urine cultures with MSSA. Repeat blood culture NGTD.  Assessment & Plan: Severe sepsis due to MSSA bacteremia/MSSA UTI/left wrist septic arthritis/small left pronator abscess-POA.  Met criteria with fever, tachycardia, leukocytosis and multiorgan failure including coagulopathy, hyperbilirubinemia, encephalopathy and lactic acidosis.  Blood cultures with MSSA.  TTE without significant finding.  MRI wrist with complex effusion raising concern for septic arthritis and a small abscess.  Repeat blood culture NGTD. -Hand surgery to take patient to the OR for washout of his left wrist. -Ceftriaxone>> Ancef.  ID recommends minimum of 6 weeks for the date of negative blood culture -Patient needs PICC line before discharge.  Acute hypoxic respiratory failure: Desaturated to 80s on 5/14.  Appears dry on exam although he has bibasilar crackles. Two view CXR with increased interstitial  markings and a small left effusion -Wean oxygen as able. -Antibiotics as above. -Incentive spirometry/OOB/PT/OT  Acute blood loss anemia superimposed on anemia of chronic disease Coagulopathy-likely due to cirrhosis and sepsis. Renal hemorrhage/gross hematuria: Noted on initial CT with interval development on repeat CT concerning for multifocal hemorrhagic renal infarcts.   Recent Labs    03/13/2021 1530 03/05/21 0824  03/06/21 0329 03/07/21 0135 03/07/21 0637 03/07/21 0905 03/07/21 1702 03/17/2021 0256 03/02/2021 1137  HGB 7.8* 8.6* 8.3* 8.7* 7.0* 6.6* 7.0* 8.3*  8.2* 8.2*  -Received 3 units of blood so far -Received 5 units of FFP.  Ordered 2 more units of FFP -Has been receiving IV vitamin K daily-but did not seem to be helping -On CBI per urology-urine seems to be pink light -Needs outpatient MRI  -Monitor H&H, and coag labs.   Liver cirrhosis with splenic varices-improved.  Likely from alcohol.  Reportedly quit drinking about 6 months ago.  Acute hepatitis panel negative.  Low suspicion for SBP. -Appreciate input by GI.  No plan for EGD -Follow speciality labs  Elevated LFT/hyperbilirubinemia-improving. Recent Labs  Lab 02/28/2021 2127 03/05/21 0824 03/06/21 0329 03/07/21 0637 03/22/2021 0256  AST 71* 64* 59* 70* 64*  ALT 40 37 32 25 21  ALKPHOS 76 85 85 102 87  BILITOT 4.7* 6.5* 5.2* 4.4* 4.2*  PROT 7.3 8.0 7.7 7.4 7.2  ALBUMIN 1.4* 1.8* 1.6* 1.6* 1.7*  -Management as above   Hepatic encephalopathy?-Oriented x4.  Ammonia 48>>>20 -Continue lactulose 20 g 3 times daily-titrate for 2-3 bowel movements a day  AKI/azotemia: Resolved. Recent Labs    02/20/2021 1619 03/05/21 0824 03/06/21 0329 03/07/21 0637 03/11/2021 0256  BUN 41* 38* 31* 25* 21*  CREATININE 1.50* 1.22 1.07 1.22 0.81  -Continue monitoring  Leukocytosis/bandemia: Likely due to #1.  Leukocytosis improved. -Continue trending. -Antibiotics as above  Thrombocytopenia:  Likely due to liver cirrhosis and acute illness Recent Labs  Lab 03/03/2021 1530 03/05/21 0824 03/06/21 0329 03/07/21 0135 03/07/21 0637 03/07/21 0905 03/09/2021 0256  PLT 62* 97* 103* 106* 98* 92* 81*  -Continue monitoring  Debility/physical deconditioning -PT/OT  Decreased oral intake Body mass index is 21.29 kg/m. Nutrition Problem: Increased nutrient needs Etiology: acute illness Signs/Symptoms: estimated needs Interventions: Ensure  Enlive (each supplement provides 350kcal and 20 grams of protein),Prostat,MVI   DVT prophylaxis:  SCDs Start: 02/23/2021 2147  Code Status: Full code Family Communication: Updated patient's mother at bedside on 5/16 Level of care: Stepdown Status is: Inpatient  Remains inpatient appropriate because:Hemodynamically unstable, Ongoing diagnostic testing needed not appropriate for outpatient work up, Unsafe d/c plan, IV treatments appropriate due to intensity of illness or inability to take PO and Inpatient level of care appropriate due to severity of illness   Dispo: The patient is from: Home              Anticipated d/c is to: Home              Patient currently is not medically stable to d/c.   Difficult to place patient No       Consultants:  Infectious disease Urology Gastroenterology Hand surgery INR.   Sch Meds:  Scheduled Meds: . (feeding supplement) PROSource Plus  30 mL Oral BID BM  . sodium chloride   Intravenous Once  . Chlorhexidine Gluconate Cloth  6 each Topical Daily  . feeding supplement  237 mL Oral BID BM  . lactulose  20 g Oral BID  . mouth rinse  15 mL Mouth Rinse BID  .  multivitamin with minerals  1 tablet Oral Daily   Continuous Infusions: .  ceFAZolin (ANCEF) IV Stopped (03/02/2021 0844)  . sodium chloride irrigation     PRN Meds:.fentaNYL (SUBLIMAZE) injection, ondansetron **OR** ondansetron (ZOFRAN) IV  Antimicrobials: Anti-infectives (From admission, onward)   Start     Dose/Rate Route Frequency Ordered Stop   03/05/21 1030  nafcillin injection 2 g  Status:  Discontinued        2 g Intravenous Every 4 hours 03/05/21 0917 03/05/21 0937   03/05/21 1030  ceFAZolin (ANCEF) IVPB 2g/100 mL premix  Status:  Discontinued        2 g 200 mL/hr over 30 Minutes Intravenous Every 8 hours 03/05/21 0938 03/05/21 0941   03/05/21 1030  ceFAZolin (ANCEF) IVPB 2g/100 mL premix        2 g 200 mL/hr over 30 Minutes Intravenous Every 8 hours 03/05/21 0950      02/27/2021 1530  cefTRIAXone (ROCEPHIN) 1 g in sodium chloride 0.9 % 100 mL IVPB  Status:  Discontinued        1 g 200 mL/hr over 30 Minutes Intravenous Every 24 hours 02/28/2021 1529 03/05/21 0915       I have personally reviewed the following labs and images: CBC: Recent Labs  Lab 03/12/2021 1530 03/05/21 0824 03/06/21 0329 03/07/21 0135 03/07/21 0637 03/07/21 0905 03/07/21 1702 03/02/2021 0256 03/09/2021 1137  WBC 18.9*   < > 16.3* 17.0* 22.7* 22.7*  --  18.2*  --   NEUTROABS 16.2*  --   --   --   --   --   --   --   --   HGB 7.8*   < > 8.3* 8.7* 7.0* 6.6* 7.0* 8.3*  8.2* 8.2*  HCT 23.2*   < > 24.7* 25.4* 21.2* 19.4* 20.7* 24.4*  24.3* 24.0*  MCV 91.0   < > 91.5 90.7 93.8 91.9  --  91.0  --   PLT 62*   < > 103* 106* 98* 92*  --  81*  --    < > = values in this interval not displayed.   BMP &GFR Recent Labs  Lab 03/18/2021 1619 03/05/21 0824 03/06/21 0329 03/07/21 0637 02/24/2021 0256  NA 126* 133* 133* 129* 131*  K 3.9 3.8 3.7 3.9 4.0  CL 95* 101 103 98 100  CO2 22 24 21* 24 24  GLUCOSE 108* 101* 122* 123* 106*  BUN 41* 38* 31* 25* 21*  CREATININE 1.50* 1.22 1.07 1.22 0.81  CALCIUM 7.7* 8.2* 8.0* 7.9* 7.9*  MG 2.0  --  1.9 1.7 1.8  PHOS  --   --  3.0 2.7 2.9   Estimated Creatinine Clearance: 95.3 mL/min (by C-G formula based on SCr of 0.81 mg/dL). Liver & Pancreas: Recent Labs  Lab 03/16/2021 2127 03/05/21 0824 03/06/21 0329 03/07/21 0637 02/22/2021 0256  AST 71* 64* 59* 70* 64*  ALT 40 37 32 25 21  ALKPHOS 76 85 85 102 87  BILITOT 4.7* 6.5* 5.2* 4.4* 4.2*  PROT 7.3 8.0 7.7 7.4 7.2  ALBUMIN 1.4* 1.8* 1.6* 1.6* 1.7*   Recent Labs  Lab 03/02/2021 1619  LIPASE 47   Recent Labs  Lab 03/13/2021 2127 03/06/21 0329 03/18/2021 0256  AMMONIA 48* 47* 20   Diabetic: No results for input(s): HGBA1C in the last 72 hours. No results for input(s): GLUCAP in the last 168 hours. Cardiac Enzymes: No results for input(s): CKTOTAL, CKMB, CKMBINDEX, TROPONINI in the last 168  hours. No results  for input(s): PROBNP in the last 8760 hours. Coagulation Profile: Recent Labs  Lab 03/05/21 0824 03/06/21 0329 03/07/21 0637 03/14/2021 0256 03/16/2021 1137  INR 1.9* 2.2* 2.3* 1.9* 2.1*   Thyroid Function Tests: No results for input(s): TSH, T4TOTAL, FREET4, T3FREE, THYROIDAB in the last 72 hours. Lipid Profile: No results for input(s): CHOL, HDL, LDLCALC, TRIG, CHOLHDL, LDLDIRECT in the last 72 hours. Anemia Panel: No results for input(s): VITAMINB12, FOLATE, FERRITIN, TIBC, IRON, RETICCTPCT in the last 72 hours. Urine analysis:    Component Value Date/Time   COLORURINE RED (A) 03/14/2021 1528   APPEARANCEUR HAZY (A) 03/15/2021 1528   LABSPEC 1.011 03/20/2021 1528   PHURINE 6.0 03/22/2021 1528   GLUCOSEU 50 (A) 03/17/2021 1528   HGBUR MODERATE (A) 02/28/2021 1528   BILIRUBINUR NEGATIVE 03/09/2021 [place] 03/16/2021 1528   PROTEINUR 100 (A) 03/13/2021 1528   NITRITE NEGATIVE 03/16/2021 1528   LEUKOCYTESUR NEGATIVE 03/18/2021 1528   Sepsis Labs: Invalid input(s): PROCALCITONIN, [place]  Microbiology: Recent Results (from the past 240 hour(s))  Resp Panel by RT-PCR (Flu A&B, Covid) Nasopharyngeal Swab     Status: None   Collection Time: 03/07/2021  3:28 PM   Specimen: Nasopharyngeal Swab; Nasopharyngeal(NP) swabs in vial transport medium  Result Value Ref Range Status   SARS Coronavirus 2 by RT PCR NEGATIVE NEGATIVE Final    Comment: (NOTE) SARS-CoV-2 target nucleic acids are NOT DETECTED.  The SARS-CoV-2 RNA is generally detectable in upper respiratory specimens during the acute phase of infection. The lowest concentration of SARS-CoV-2 viral copies this assay can detect is 138 copies/mL. A negative result does not preclude SARS-Cov-2 infection and should not be used as the sole basis for treatment or other patient management decisions. A negative result may occur with  improper specimen collection/handling, submission of specimen  other than nasopharyngeal swab, presence of viral mutation(s) within the areas targeted by this assay, and inadequate number of viral copies(<138 copies/mL). A negative result must be combined with clinical observations, patient history, and epidemiological information. The expected result is Negative.  Fact Sheet for Patients:  EntrepreneurPulse.com.au  Fact Sheet for Healthcare Providers:  IncredibleEmployment.be  This test is no t yet approved or cleared by the [place] FDA and  has been authorized for detection and/or diagnosis of SARS-CoV-2 by FDA under an Emergency Use Authorization (EUA). This EUA will remain  in effect (meaning this test can be used) for the duration of the COVID-19 declaration under Section 564(b)(1) of the Act, 21 U.S.C.section 360bbb-3(b)(1), unless the authorization is terminated  or revoked sooner.       Influenza A by PCR NEGATIVE NEGATIVE Final   Influenza B by PCR NEGATIVE NEGATIVE Final    Comment: (NOTE) The Xpert Xpress SARS-CoV-2/FLU/RSV plus assay is intended as an aid in the diagnosis of influenza from Nasopharyngeal swab specimens and should not be used as a sole basis for treatment. Nasal washings and aspirates are unacceptable for Xpert Xpress SARS-CoV-2/FLU/RSV testing.  Fact Sheet for Patients: EntrepreneurPulse.com.au  Fact Sheet for Healthcare Providers: IncredibleEmployment.be  This test is not yet approved or cleared by the [place] FDA and has been authorized for detection and/or diagnosis of SARS-CoV-2 by FDA under an Emergency Use Authorization (EUA). This EUA will remain in effect (meaning this test can be used) for the duration of the COVID-19 declaration under Section 564(b)(1) of the Act, 21 U.S.C. section 360bbb-3(b)(1), unless the authorization is terminated or revoked.  Performed at [facility], [place]  [address], [place], [place] [address]   Urine culture     Status: Abnormal   Collection Time: 03/03/2021  3:28 PM   Specimen: In/Out Cath Urine  Result Value Ref Range Status   Specimen Description   Final    IN/OUT CATH URINE Performed at [facility] [address] [place], [place] [address]    Special Requests   Final    NONE Performed at [facility], [place] [address], [place], [place] [address]    Culture >=100,000 COLONIES/mL STAPHYLOCOCCUS AUREUS (A)  Final   Report Status 03/07/2021 FINAL  Final   Organism ID, Bacteria STAPHYLOCOCCUS AUREUS (A)  Final      Susceptibility   Staphylococcus aureus - MIC*    CIPROFLOXACIN <=0.5 SENSITIVE Sensitive     GENTAMICIN <=0.5 SENSITIVE Sensitive     NITROFURANTOIN <=16 SENSITIVE Sensitive     OXACILLIN <=0.25 SENSITIVE Sensitive     TETRACYCLINE >=16 RESISTANT Resistant     VANCOMYCIN 1 SENSITIVE Sensitive     TRIMETH/SULFA <=10 SENSITIVE Sensitive     CLINDAMYCIN <=0.25 SENSITIVE Sensitive     RIFAMPIN <=0.5 SENSITIVE Sensitive     Inducible Clindamycin NEGATIVE Sensitive     * >=100,000 COLONIES/mL STAPHYLOCOCCUS AUREUS  Blood Culture (routine x 2)     Status: Abnormal   Collection Time: 03/03/2021  3:30 PM   Specimen: BLOOD  Result Value Ref Range Status   Specimen Description   Final    BLOOD LEFT ARM Performed at [facility] [address] [place] [address] [place], [place] [address]    Special Requests   Final    BOTTLES DRAWN AEROBIC AND ANAEROBIC Blood Culture results may not be optimal due to an inadequate volume of blood received in culture bottles Performed at [facility] [address] [place], [place] [address]    Culture  Setup Time   Final    GRAM POSITIVE COCCI IN CLUSTERS IN BOTH AEROBIC AND ANAEROBIC BOTTLES CRITICAL RESULT CALLED TO, READ BACK BY AND VERIFIED WITH: SCOTT CHRISTY PHARMD '@0850'  03/05/21 EB Performed at [facility], [place] [address]  [place], [place] [address]    Culture STAPHYLOCOCCUS AUREUS (A)  Final   Report Status 03/07/2021 FINAL  Final   Organism ID, Bacteria STAPHYLOCOCCUS AUREUS  Final      Susceptibility   Staphylococcus aureus - MIC*    CIPROFLOXACIN <=0.5 SENSITIVE Sensitive     ERYTHROMYCIN <=0.25 SENSITIVE Sensitive     GENTAMICIN <=0.5 SENSITIVE Sensitive     OXACILLIN <=0.25 SENSITIVE Sensitive     TETRACYCLINE >=16 RESISTANT Resistant     VANCOMYCIN 1 SENSITIVE Sensitive     TRIMETH/SULFA <=10 SENSITIVE Sensitive     CLINDAMYCIN <=0.25 SENSITIVE Sensitive     RIFAMPIN <=0.5 SENSITIVE Sensitive     Inducible Clindamycin NEGATIVE Sensitive     * STAPHYLOCOCCUS AUREUS  Blood Culture ID Panel (Reflexed)     Status: Abnormal   Collection Time: 02/20/2021  3:30 PM  Result Value Ref Range Status   Enterococcus faecalis NOT DETECTED NOT DETECTED Final   Enterococcus Faecium NOT DETECTED NOT DETECTED Final   Listeria monocytogenes NOT DETECTED NOT DETECTED Final   Staphylococcus species DETECTED (A) NOT DETECTED Final    Comment: CRITICAL RESULT CALLED TO, READ BACK BY AND VERIFIED WITH: SCOTT CHRISTY PHARMD '@0850'  03/05/21 EB    Staphylococcus aureus (BCID) DETECTED (A) NOT DETECTED Final    Comment: CRITICAL RESULT CALLED TO, READ BACK  BY AND VERIFIED WITH: SCOTT CHRISTY PHARMD '@0850'  03/05/21 EB    Staphylococcus epidermidis NOT DETECTED NOT DETECTED Final   Staphylococcus lugdunensis NOT DETECTED NOT DETECTED Final   Streptococcus species NOT DETECTED NOT DETECTED Final   Streptococcus agalactiae NOT DETECTED NOT DETECTED Final   Streptococcus pneumoniae NOT DETECTED NOT DETECTED Final   Streptococcus pyogenes NOT DETECTED NOT DETECTED Final   A.calcoaceticus-baumannii NOT DETECTED NOT DETECTED Final   Bacteroides fragilis NOT DETECTED NOT DETECTED Final   Enterobacterales NOT DETECTED NOT DETECTED Final   Enterobacter cloacae complex NOT DETECTED NOT DETECTED Final   Escherichia coli NOT DETECTED NOT  DETECTED Final   Klebsiella aerogenes NOT DETECTED NOT DETECTED Final   Klebsiella oxytoca NOT DETECTED NOT DETECTED Final   Klebsiella pneumoniae NOT DETECTED NOT DETECTED Final   Proteus species NOT DETECTED NOT DETECTED Final   Salmonella species NOT DETECTED NOT DETECTED Final   Serratia marcescens NOT DETECTED NOT DETECTED Final   Haemophilus influenzae NOT DETECTED NOT DETECTED Final   Neisseria meningitidis NOT DETECTED NOT DETECTED Final   Pseudomonas aeruginosa NOT DETECTED NOT DETECTED Final   Stenotrophomonas maltophilia NOT DETECTED NOT DETECTED Final   Candida albicans NOT DETECTED NOT DETECTED Final   Candida auris NOT DETECTED NOT DETECTED Final   Candida glabrata NOT DETECTED NOT DETECTED Final   Candida krusei NOT DETECTED NOT DETECTED Final   Candida parapsilosis NOT DETECTED NOT DETECTED Final   Candida tropicalis NOT DETECTED NOT DETECTED Final   Cryptococcus neoformans/gattii NOT DETECTED NOT DETECTED Final   Meth resistant mecA/C and MREJ NOT DETECTED NOT DETECTED Final    Comment: Performed at [facility], [address] [place], [place] [address]  Blood Culture (routine x 2)     Status: Abnormal   Collection Time: 02/22/2021  3:33 PM   Specimen: BLOOD  Result Value Ref Range Status   Specimen Description   Final    BLOOD RIGHT ANTECUBITAL Performed at [facility] [address], [place], [place] [address]    Special Requests   Final    BOTTLES DRAWN AEROBIC AND ANAEROBIC Blood Culture results may not be optimal due to an inadequate volume of blood received in culture bottles Performed at [facility] [address], [place], [place] [address]    Culture  Setup Time   Final    GRAM POSITIVE COCCI IN CLUSTERS ANAEROBIC BOTTLE ONLY CRITICAL VALUE NOTED.  VALUE IS CONSISTENT WITH PREVIOUSLY REPORTED AND CALLED VALUE.    Culture (A)  Final    STAPHYLOCOCCUS AUREUS SUSCEPTIBILITIES PERFORMED ON PREVIOUS CULTURE  WITHIN THE LAST 5 DAYS. Performed at [facility], [place] [address] [place], [place] [address]    Report Status 03/07/2021 FINAL  Final  MRSA PCR Screening     Status: None   Collection Time: 03/05/21  8:19 AM   Specimen: Nasal Mucosa; Nasopharyngeal  Result Value Ref Range Status   MRSA by PCR NEGATIVE NEGATIVE Final    Comment:        The GeneXpert MRSA Assay (FDA approved for NASAL specimens only), is one component of a comprehensive MRSA colonization surveillance program. It is not intended to diagnose MRSA infection nor to guide or monitor treatment for MRSA infections. Performed at [facility], [place] [address], [place], [place] [address]   Culture, blood (Routine X 2) w Reflex to ID Panel     Status: None (Preliminary result)   Collection Time: 03/05/21  2:22  PM   Specimen: BLOOD  Result Value Ref Range Status   Specimen Description   Final    BLOOD LEFT ANTECUBITAL Performed at [facility] [address], [place], [place] [address]    Special Requests   Final    BOTTLES DRAWN AEROBIC ONLY Blood Culture adequate volume Performed at [facility] [address], [place], [place] [address]    Culture   Final    NO GROWTH 3 DAYS Performed at [facility], [place] [address], [place], [place] [address]    Report Status PENDING  Incomplete  Culture, blood (Routine X 2) w Reflex to ID Panel     Status: None (Preliminary result)   Collection Time: 03/05/21  2:24 PM   Specimen: BLOOD LEFT HAND  Result Value Ref Range Status   Specimen Description   Final    BLOOD LEFT HAND Performed at [facility] [address] [place], [place] [address]    Special Requests   Final    BOTTLES DRAWN AEROBIC ONLY Blood Culture results may not be optimal due to an inadequate volume of blood received in culture bottles Performed at [facility] [address], [place],   [address]    Culture   Final    NO GROWTH 3 DAYS Performed at [facility], [place] [address], [place],  [address]    Report Status PENDING  Incomplete    Radiology Studies: No results found.    Greg Bean T. [place]  If 7PM-7AM, please contact night-coverage [email or web address] 02/28/2021, 12:42 PM

## 2021-03-08 NOTE — Progress Notes (Signed)
Pt for I&D Left wrist this evening.  Pt seen and examined, all questions answered.  Will proceed.

## 2021-03-09 ENCOUNTER — Inpatient Hospital Stay: Payer: Self-pay

## 2021-03-09 ENCOUNTER — Encounter (HOSPITAL_COMMUNITY): Payer: Self-pay | Admitting: General Surgery

## 2021-03-09 DIAGNOSIS — B9561 Methicillin susceptible Staphylococcus aureus infection as the cause of diseases classified elsewhere: Secondary | ICD-10-CM | POA: Diagnosis not present

## 2021-03-09 DIAGNOSIS — R7881 Bacteremia: Secondary | ICD-10-CM | POA: Diagnosis not present

## 2021-03-09 LAB — COMPREHENSIVE METABOLIC PANEL
ALT: 19 U/L (ref 0–44)
AST: 73 U/L — ABNORMAL HIGH (ref 15–41)
Albumin: 1.8 g/dL — ABNORMAL LOW (ref 3.5–5.0)
Alkaline Phosphatase: 77 U/L (ref 38–126)
Anion gap: 9 (ref 5–15)
BUN: 39 mg/dL — ABNORMAL HIGH (ref 6–20)
CO2: 22 mmol/L (ref 22–32)
Calcium: 7.8 mg/dL — ABNORMAL LOW (ref 8.9–10.3)
Chloride: 102 mmol/L (ref 98–111)
Creatinine, Ser: 1.68 mg/dL — ABNORMAL HIGH (ref 0.61–1.24)
GFR, Estimated: 48 mL/min — ABNORMAL LOW (ref >60)
Glucose, Bld: 134 mg/dL — ABNORMAL HIGH (ref 70–99)
Potassium: 5 mmol/L (ref 3.5–5.1)
Sodium: 133 mmol/L — ABNORMAL LOW (ref 135–145)
Total Bilirubin: 5.8 mg/dL — ABNORMAL HIGH (ref 0.3–1.2)
Total Protein: 7 g/dL (ref 6.5–8.1)

## 2021-03-09 LAB — CBC
HCT: 22.8 % — ABNORMAL LOW (ref 39.0–52.0)
Hemoglobin: 7.7 g/dL — ABNORMAL LOW (ref 13.0–17.0)
MCH: 30.7 pg (ref 26.0–34.0)
MCHC: 33.8 g/dL (ref 30.0–36.0)
MCV: 90.8 fL (ref 80.0–100.0)
Platelets: 85 10*3/uL — ABNORMAL LOW (ref 150–400)
RBC: 2.51 MIL/uL — ABNORMAL LOW (ref 4.22–5.81)
RDW: 16.3 % — ABNORMAL HIGH (ref 11.5–15.5)
WBC: 22.5 10*3/uL — ABNORMAL HIGH (ref 4.0–10.5)
nRBC: 0.7 % — ABNORMAL HIGH (ref 0.0–0.2)

## 2021-03-09 LAB — APTT: aPTT: 30 seconds (ref 24–36)

## 2021-03-09 LAB — HEMOGLOBIN AND HEMATOCRIT, BLOOD
HCT: 21.5 % — ABNORMAL LOW (ref 39.0–52.0)
HCT: 23.1 % — ABNORMAL LOW (ref 39.0–52.0)
HCT: 21 % — ABNORMAL LOW (ref 39.0–52.0)
Hemoglobin: 7.2 g/dL — ABNORMAL LOW (ref 13.0–17.0)
Hemoglobin: 7.7 g/dL — ABNORMAL LOW (ref 13.0–17.0)
Hemoglobin: 7 g/dL — ABNORMAL LOW (ref 13.0–17.0)

## 2021-03-09 LAB — PROTIME-INR
INR: 2.4 — ABNORMAL HIGH (ref 0.8–1.2)
Prothrombin Time: 25.9 seconds — ABNORMAL HIGH (ref 11.4–15.2)

## 2021-03-09 LAB — MAGNESIUM: Magnesium: 1.8 mg/dL (ref 1.7–2.4)

## 2021-03-09 LAB — PREPARE RBC (CROSSMATCH)

## 2021-03-09 MED ORDER — LIP MEDEX EX OINT
TOPICAL_OINTMENT | CUTANEOUS | Status: AC
  Filled 2021-03-09: qty 7

## 2021-03-09 MED ORDER — SODIUM CHLORIDE 0.9% IV SOLUTION: Freq: Once | INTRAVENOUS | Status: AC

## 2021-03-09 NOTE — Op Note (Signed)
NAMEROSHAD, HACK MEDICAL RECORD NO: [id number] ACCOUNT NO: [id number] DATE OF BIRTH: Mar 07, 1966 FACILITY: [facility] LOCATION: [id number] PHYSICIAN: Lakrisha Iseman C. Izora Ribas, MD  Operative Report   DATE OF PROCEDURE: 02/24/2021  PREOPERATIVE DIAGNOSIS:  Septic left wrist.  POSTOPERATIVE DIAGNOSIS:  Septic left wrist.  PROCEDURE: 1.  Incision and drainage of left wrist including open drainage of a small abscess volar wrist, arthrotomy with radiocarpal joint washout, arthrotomy with distal radioulnar joint washout. 2.  Placement of 15-French drain.  ANESTHESIA:  General.  ESTIMATED BLOOD LOSS:  25 mL.  COMPLICATIONS:  No acute complications.  INDICATIONS:  The patient is a 55 year old gentleman who was admitted to the hospital several days ago with bacteremia.  He had a long duration of left wrist pain; however, it has become worse.  MRI obtained suggested septic wrist.  He was consented for  incision and drainage and washout.  DESCRIPTION OF PROCEDURE:  The patient was taken to the operating room and placed supine on the operating table.  Anesthesia was administered.  A timeout was performed, identifying the correct side and procedure.  Initially, to go after a small aspect in  the volar wrist, a small incision was made overlying the FCR tendon.  Careful dissection was used to carry down to the pronator quadratus muscle.  The questionable abscess or fluid collection was deep to the pronator quadratus muscle.  There was quite a  bit of thickened synovium/scar tissue at the distal aspect of the pronator quadratus muscle; however, more radially, there was evidence of some type of cystic type material.  This did not look infected.  There was no other fluid collection identified  here. Through this incision, the radiocarpal joint was accessed.  There was no pus and minimal fluid there.  This wound was thoroughly irrigated with normal saline solution as well as half a bottle of IrriSept irrigation solution,  which was allowed to  sit in the wound for a period of time.  Next, a small round drain was placed deep by the radius, exited the distal forearm.  The hand was then turned over to the dorsal side.  A small incision overlying the distal radioulnar joint was made.  Dissection  was carried down to the joint itself.  Open arthrotomy was performed.  There was a little bit of fluid in the joint; however, no pus.  The joint was again thoroughly irrigated with normal saline solution, followed by irrigation with IrriSept solution  that was allowed to sit for several minutes.  Next, the tourniquet was released. Due to the patient's comorbidities and elevated INR, there was quite a bit of oozing from the surrounding tissue.  This was controlled adequately with bipolar cautery as  well as direct pressure.  Next, the drain was secured volarly and the small wound was closed with multiple interrupted 4-0 nylon sutures.  The dorsal wound was also closed with interrupted nylon sutures.  Sterile dressing and Ace wrap were placed.  The  patient tolerated the procedure well and was taken to recovery room in stable condition.   SHW D: 03/06/2021 8:02:47 pm T: 03/09/2021 9:26:00 am  JOB: [id number]/ [id number]

## 2021-03-09 NOTE — Progress Notes (Signed)
1 Day Post-Op Subjective: S/p left wrist debridement due to septic arthritis.  No complaints of flank pain.  Urine has remained light pink over the past 24 hours.  H&H stable  Objective: Vital signs in last 24 hours: Temp:  [97.5 F (36.4 C)-99 F (37.2 C)] 97.9 F (36.6 C) (05/18 1341) Pulse Rate:  [98-120] 111 (05/18 1341) Resp:  [17-36] 27 (05/18 1341) BP: (97-172)/(51-83) 116/63 (05/18 1341) SpO2:  [86 %-100 %] 96 % (05/18 1341) Weight:  [65.4 kg] 65.4 kg (05/17 1803)  Intake/Output from previous day: 05/17 0701 - 05/18 0700 In: 62130.8 [I.V.:769.1; Blood:1012; IV Piggyback:681.6] Out: 65784 [Urine:64450]  Intake/Output this shift: Total I/O In: 6000 [Other:6000] Out: 69629 [Urine:17900]  Physical Exam:  General: Alert and oriented GU: Three-way Foley catheter in place and draining light pink urine with CBI   Lab Results: Recent Labs    03/03/2021 2331 03/09/21 0746 03/09/21 1119  HGB 6.7* 7.7* 7.2*  HCT 20.5* 22.8* 21.5*   BMET Recent Labs    03/15/2021 0256 03/09/21 0746  NA 131* 133*  K 4.0 5.0  CL 100 102  CO2 24 22  GLUCOSE 106* 134*  BUN 21* 39*  CREATININE 0.81 1.68*  CALCIUM 7.9* 7.8*     Studies/Results: DG Chest Port 1 View  Result Date: 02/22/2021 CLINICAL DATA:  Dyspnea EXAM: PORTABLE CHEST 1 VIEW COMPARISON:  03/07/2021 FINDINGS: Cardiac shadow is within normal limits. The lungs are well aerated bilaterally. No focal infiltrate or sizable effusion is seen. The vascular congestion has improved when compared with the prior study. Overall aeration has improved as well. IMPRESSION: Improved aeration with decreased vascular congestion. No new focal abnormality is noted. Electronically Signed   By: Alcide Clever M.D.   On: 03/16/2021 17:35   [place] EKG SITE RITE  Result Date: 03/09/2021 If Site Rite image not attached, placement could not be confirmed due to current cardiac rhythm.   Assessment/Plan: 55 year old male with a hemorrhagic mass  involving the right kidney associatedwith gross hematuria along with endocarditis with bacteremia and septic emboli involving multiple organ systems  -Continue CBI.  Patient is likely showering emboli into both kidneys which is causing upper tract bleeding (especially on the right). -Transfuse as needed -Correct coagulopathy    LOS: 5 days   Rhoderick Moody, MD [facility] Pager: [phone]  03/09/2021, 2:28 PM

## 2021-03-09 NOTE — Progress Notes (Signed)
PROGRESS NOTE  Ojas Coone TMH:[id number] DOB: [birth year] DOA: 03/13/2021 PCP: Pcp, No  HPI/Recap of past 52 hours: 55 year old M with no PMH other than previous alcohol abuse and COVID-19 illness presenting with hematuria, flank pain, weight loss and confusion, and found to have SIRS, liver cirrhosis with splenic varices, coagulopathy, hyperbilirubinemia, AKI, ABLA due to renal hematoma and gross hematuria.   Patient received 3 units of PRBC, 7 units of FFP and multiple lines of IV vitamin K for coagulopathy.  Started on IV ceftriaxone for SIRS, and IVF for AKI.  GI and urology consulted.   Blood culture with MSSA.  MRI left wrist with small abscess and complex joint effusion concerning for septic arthritis. TTE without vegetation or significant finding. ID de-escalated antibiotic from ceftriaxone to Ancef and recommending a minimum of 6 weeks from negative blood culture.  Repeat blood culture NGTD.  Surgery to take patient to the OR for his septic arthritis.  On CBI per urology.  03/09/21:  Foley in place, pink urine in the foley bag.  Left wrist pain is well controlled.  No new complaints.   Assessment/Plan: Principal Problem:   MSSA bacteremia Active Problems:   Renal hemorrhage, right   SIRS (systemic inflammatory response syndrome) (HCC)   Cirrhosis of liver (HCC)   ABLA (acute blood loss anemia)   AKI (acute kidney injury) (HCC)   Anemia   Thrombocytopenia (HCC)   Staphylococcal arthritis of left wrist (HCC)  Severe sepsis due to MSSA bacteremia/MSSA UTI/left wrist septic arthritis/small left pronator abscess-all POA.  Met criteria with fever, tachycardia, leukocytosis and multiorgan failure including coagulopathy, hyperbilirubinemia, encephalopathy and lactic acidosis.  Blood cultures with MSSA.  TTE without significant finding.  MRI wrist with complex effusion raising concern for septic arthritis and a small abscess.  Repeat blood culture negative to date. -Hand surgery  to take patient to the OR for washout of his left wrist. -Ceftriaxone>> Ancef.  ID recommends minimum of 6 weeks for the date of negative blood culture -Patient needs PICC line before discharge. -Appreciate general surgery and ID's assistance.  AKI Baseline creatinine appears to be 0.8 with GFR greater than 60 Today his creatinine increased to 1.68 with GFR of 48. Avoid dehydration, hypotension, nephrotoxic agents. Currently on continuous bladder irrigation. Encourage oral intake as tolerated. Monitor urine output Repeat renal panel in the morning  Acute hypoxic respiratory failure: Desaturated to 80s on 5/14.  Appears dry on exam although he has bibasilar crackles. Two view CXR with increased interstitial markings and a small left effusion -Wean oxygen as able. -Antibiotics as above. -OOB/PT/OT -Mobilize as tolerated -Incentive spirometer flutter valve  Acute blood loss anemia superimposed on anemia of chronic disease Coagulopathy-likely due to cirrhosis and sepsis. Renal hemorrhage/gross hematuria: Noted on initial CT with interval development on repeat CT concerning for multifocal hemorrhagic renal infarcts.   Recent Labs (within last 365 days)             Recent Labs    03/01/2021 1530 03/05/21 0824 03/06/21 0329 03/07/21 0135 03/07/21 0637 03/07/21 0905 03/07/21 1702 03/18/2021 0256 03/03/2021 1137  HGB 7.8* 8.6* 8.3* 8.7* 7.0* 6.6* 7.0* 8.3*  8.2* 8.2*    -Received 3 units of blood so far -Received 5 units of FFP.  Ordered 2 more units of FFP -Has been receiving IV vitamin K daily-but did not seem to be helping -On CBI per urology-urine seems to be pink light -Needs outpatient MRI  -Monitor H&H, and coag labs. -Drop in hemoglobin 7.2 from  7.7. Transfuse 1 unit PRBC  Liver cirrhosis with splenic varices-improved.  Likely from alcohol.  Reportedly quit drinking about 6 months ago.  Acute hepatitis panel negative.  Low suspicion for SBP. -Appreciate input by GI.  No  plan for EGD -Follow speciality labs  Elevated LFT/hyperbilirubinemia-levels are uptrending.  Continue to closely monitor. Last Labs          Recent Labs  Lab 03/22/2021 2127 03/05/21 0824 03/06/21 0329 03/07/21 0637 03/17/2021 0256  AST 71* 64* 59* 70* 64*  ALT 40 37 32 25 21  ALKPHOS 76 85 85 102 87  BILITOT 4.7* 6.5* 5.2* 4.4* 4.2*  PROT 7.3 8.0 7.7 7.4 7.2  ALBUMIN 1.4* 1.8* 1.6* 1.6* 1.7*    -Management as above  Severe protein calorie malnutrition Albumin 1.8, BMI 21. Severe muscle mass loss Increase oral protein calorie intake. Dietitian consult  Resolved hepatic encephalopathy-Oriented x4.  Ammonia 48>>>20 -Continue lactulose 20 g 3 times daily-titrate for 2-3 bowel movements a day -At the time of this visit, he is alert oriented x3.  AKI/azotemia: Resolved. Recent Labs (within last 365 days)         Recent Labs    03/09/2021 1619 03/05/21 0824 03/06/21 0329 03/07/21 0637 03/15/2021 0256  BUN 41* 38* 31* 25* 21*  CREATININE 1.50* 1.22 1.07 1.22 0.81    -Continue monitoring  Leukocytosis/bandemia: Likely due to #1.  Leukocytosis, suspect reactive  Thrombocytopenia:  Likely due to liver cirrhosis and acute illness Last Labs            Recent Labs  Lab 03/14/2021 1530 03/05/21 0824 03/06/21 0329 03/07/21 0135 03/07/21 0637 03/07/21 0905 03/12/2021 0256  PLT 62* 97* 103* 106* 98* 92* 81*    -Continue monitoring -Platelet count 85,000.  Debility/physical deconditioning -PT/OT  Decreased oral intake Body mass index is 21.29 kg/m. Nutrition Problem: Increased nutrient needs Etiology: acute illness Signs/Symptoms: estimated needs Interventions: Ensure Enlive (each supplement provides 350kcal and 20 grams of protein),Prostat,MVI DVT prophylaxis:  SCDs Start: 03/13/2021 2147  Code Status: Full code Family Communication: Updated patient's mother at bedside on 5/16 Level of care: Stepdown Status is: Inpatient  Remains inpatient appropriate  because:Hemodynamically unstable, Ongoing diagnostic testing needed not appropriate for outpatient work up, Unsafe d/c plan, IV treatments appropriate due to intensity of illness or inability to take PO and Inpatient level of care appropriate due to severity of illness   Dispo: The patient is from: Home  Anticipated d/c is to: Home  Patient currently is not medically stable to d/c.              Difficult to place patient No       Consultants:  Infectious disease Urology Gastroenterology Hand surgery INR.        Objective: Vitals:   03/09/21 0859 03/09/21 0900 03/09/21 1000 03/09/21 1100  BP:  107/66 105/61 106/69  Pulse:   (!) 112 (!) 112  Resp:  (!) 24 (!) 23 (!) 30  Temp: 98.3 F (36.8 C)     TempSrc: Oral     SpO2:   96% 96%  Weight:      Height:        Intake/Output Summary (Last 24 hours) at 03/09/2021 1233 Last data filed at 03/09/2021 1229 Gross per 24 hour  Intake 52617.03 ml  Output 62850 ml  Net -10232.97 ml   Filed Weights   03/03/2021 1451 03/05/21 0040 02/27/2021 1803  Weight: 60.3 kg 65.4 kg 65.4 kg    Exam:  .  General: 55 y.o. year-old male frail-appearing.  In no acute distress.  Alert and oriented x3. . Cardiovascular: Regular rate and rhythm with no rubs or gallops.  No thyromegaly or JVD noted.   Marland Kitchen Respiratory: Clear to auscultation with no wheezes or rales. Good inspiratory effort. . Abdomen: Soft nontender nondistended with normal bowel sounds x4 quadrants. . Musculoskeletal: No lower extremity edema.  Left wrist in surgical dressing. . Skin: Left wrist in surgical dressing. Marland Kitchen Psychiatry: Mood is appropriate for condition and setting   Data Reviewed: CBC: Recent Labs  Lab 03/09/2021 1530 03/05/21 0824 03/07/21 0135 03/07/21 0637 03/07/21 0905 03/07/21 1702 02/23/2021 0256 03/03/2021 1137 02/24/2021 1732 03/11/2021 2331 03/09/21 0746 03/09/21 1119  WBC 18.9*   < > 17.0* 22.7* 22.7*  --  18.2*  --   --    --  22.5*  --   NEUTROABS 16.2*  --   --   --   --   --   --   --   --   --   --   --   HGB 7.8*   < > 8.7* 7.0* 6.6*   < > 8.3*  8.2* 8.2* 6.7* 6.7* 7.7* 7.2*  HCT 23.2*   < > 25.4* 21.2* 19.4*   < > 24.4*  24.3* 24.0* 19.9* 20.5* 22.8* 21.5*  MCV 91.0   < > 90.7 93.8 91.9  --  91.0  --   --   --  90.8  --   PLT 62*   < > 106* 98* 92*  --  81*  --   --   --  85*  --    < > = values in this interval not displayed.   Basic Metabolic Panel: Recent Labs  Lab 03/15/2021 1619 03/05/21 0824 03/06/21 0329 03/07/21 0637 03/06/2021 0256 03/09/21 0746  NA 126* 133* 133* 129* 131* 133*  K 3.9 3.8 3.7 3.9 4.0 5.0  CL 95* 101 103 98 100 102  CO2 22 24 21* '24 24 22  ' GLUCOSE 108* 101* 122* 123* 106* 134*  BUN 41* 38* 31* 25* 21* 39*  CREATININE 1.50* 1.22 1.07 1.22 0.81 1.68*  CALCIUM 7.7* 8.2* 8.0* 7.9* 7.9* 7.8*  MG 2.0  --  1.9 1.7 1.8 1.8  PHOS  --   --  3.0 2.7 2.9  --    GFR: Estimated Creatinine Clearance: 46 mL/min (A) (by C-G formula based on SCr of 1.68 mg/dL (H)). Liver Function Tests: Recent Labs  Lab 03/05/21 0824 03/06/21 0329 03/07/21 0637 02/22/2021 0256 03/09/21 0746  AST 64* 59* 70* 64* 73*  ALT 37 32 '25 21 19  ' ALKPHOS 85 85 102 87 77  BILITOT 6.5* 5.2* 4.4* 4.2* 5.8*  PROT 8.0 7.7 7.4 7.2 7.0  ALBUMIN 1.8* 1.6* 1.6* 1.7* 1.8*   Recent Labs  Lab 03/11/2021 1619  LIPASE 47   Recent Labs  Lab 03/15/2021 2127 03/06/21 0329 03/16/2021 0256  AMMONIA 48* 47* 20   Coagulation Profile: Recent Labs  Lab 03/07/21 0637 03/09/2021 0256 02/26/2021 1137 02/23/2021 1732 03/09/21 0746  INR 2.3* 1.9* 2.1* 2.1* 2.4*   Cardiac Enzymes: No results for input(s): CKTOTAL, CKMB, CKMBINDEX, TROPONINI in the last 168 hours. BNP (last 3 results) No results for input(s): PROBNP in the last 8760 hours. HbA1C: No results for input(s): HGBA1C in the last 72 hours. CBG: No results for input(s): GLUCAP in the last 168 hours. Lipid Profile: No results for input(s): CHOL, HDL, LDLCALC,  TRIG, CHOLHDL, LDLDIRECT in  the last 72 hours. Thyroid Function Tests: No results for input(s): TSH, T4TOTAL, FREET4, T3FREE, THYROIDAB in the last 72 hours. Anemia Panel: No results for input(s): VITAMINB12, FOLATE, FERRITIN, TIBC, IRON, RETICCTPCT in the last 72 hours. Urine analysis:    Component Value Date/Time   COLORURINE RED (A) 03/22/2021 1528   APPEARANCEUR HAZY (A) 03/01/2021 1528   LABSPEC 1.011 03/17/2021 1528   PHURINE 6.0 03/05/2021 1528   GLUCOSEU 50 (A) 03/11/2021 1528   HGBUR MODERATE (A) 03/07/2021 1528   BILIRUBINUR NEGATIVE 02/25/2021 [place] 03/06/2021 1528   PROTEINUR 100 (A) 03/09/2021 1528   NITRITE NEGATIVE 03/03/2021 1528   LEUKOCYTESUR NEGATIVE 03/20/2021 1528   Sepsis Labs: '@[email or web address]' (procalcitonin:4,lacticidven:4)  ) Recent Results (from the past 240 hour(s))  Resp Panel by RT-PCR (Flu A&B, Covid) Nasopharyngeal Swab     Status: None   Collection Time: 03/02/2021  3:28 PM   Specimen: Nasopharyngeal Swab; Nasopharyngeal(NP) swabs in vial transport medium  Result Value Ref Range Status   SARS Coronavirus 2 by RT PCR NEGATIVE NEGATIVE Final    Comment: (NOTE) SARS-CoV-2 target nucleic acids are NOT DETECTED.  The SARS-CoV-2 RNA is generally detectable in upper respiratory specimens during the acute phase of infection. The lowest concentration of SARS-CoV-2 viral copies this assay can detect is 138 copies/mL. A negative result does not preclude SARS-Cov-2 infection and should not be used as the sole basis for treatment or other patient management decisions. A negative result may occur with  improper specimen collection/handling, submission of specimen other than nasopharyngeal swab, presence of viral mutation(s) within the areas targeted by this assay, and inadequate number of viral copies(<138 copies/mL). A negative result must be combined with clinical observations, patient history, and epidemiological information. The expected  result is Negative.  Fact Sheet for Patients:  EntrepreneurPulse.com.au  Fact Sheet for Healthcare Providers:  IncredibleEmployment.be  This test is no t yet approved or cleared by the [place] FDA and  has been authorized for detection and/or diagnosis of SARS-CoV-2 by FDA under an Emergency Use Authorization (EUA). This EUA will remain  in effect (meaning this test can be used) for the duration of the COVID-19 declaration under Section 564(b)(1) of the Act, 21 U.S.C.section 360bbb-3(b)(1), unless the authorization is terminated  or revoked sooner.       Influenza A by PCR NEGATIVE NEGATIVE Final   Influenza B by PCR NEGATIVE NEGATIVE Final    Comment: (NOTE) The Xpert Xpress SARS-CoV-2/FLU/RSV plus assay is intended as an aid in the diagnosis of influenza from Nasopharyngeal swab specimens and should not be used as a sole basis for treatment. Nasal washings and aspirates are unacceptable for Xpert Xpress SARS-CoV-2/FLU/RSV testing.  Fact Sheet for Patients: EntrepreneurPulse.com.au  Fact Sheet for Healthcare Providers: IncredibleEmployment.be  This test is not yet approved or cleared by the [place] FDA and has been authorized for detection and/or diagnosis of SARS-CoV-2 by FDA under an Emergency Use Authorization (EUA). This EUA will remain in effect (meaning this test can be used) for the duration of the COVID-19 declaration under Section 564(b)(1) of the Act, 21 U.S.C. section 360bbb-3(b)(1), unless the authorization is terminated or revoked.  Performed at [facility], [place] [address], [place], [place] [address]   Urine culture     Status: Abnormal   Collection Time: 03/14/2021  3:28 PM   Specimen: In/Out Cath Urine  Result Value Ref Range Status   Specimen Description   Final    IN/OUT CATH URINE Performed  at [facility], [place] [address]  [place], [place] [address]    Special Requests   Final    NONE Performed at [facility], [place] [address], [place], [place] [address]    Culture >=100,000 COLONIES/mL STAPHYLOCOCCUS AUREUS (A)  Final   Report Status 03/07/2021 FINAL  Final   Organism ID, Bacteria STAPHYLOCOCCUS AUREUS (A)  Final      Susceptibility   Staphylococcus aureus - MIC*    CIPROFLOXACIN <=0.5 SENSITIVE Sensitive     GENTAMICIN <=0.5 SENSITIVE Sensitive     NITROFURANTOIN <=16 SENSITIVE Sensitive     OXACILLIN <=0.25 SENSITIVE Sensitive     TETRACYCLINE >=16 RESISTANT Resistant     VANCOMYCIN 1 SENSITIVE Sensitive     TRIMETH/SULFA <=10 SENSITIVE Sensitive     CLINDAMYCIN <=0.25 SENSITIVE Sensitive     RIFAMPIN <=0.5 SENSITIVE Sensitive     Inducible Clindamycin NEGATIVE Sensitive     * >=100,000 COLONIES/mL STAPHYLOCOCCUS AUREUS  Blood Culture (routine x 2)     Status: Abnormal   Collection Time: 02/28/2021  3:30 PM   Specimen: BLOOD  Result Value Ref Range Status   Specimen Description   Final    BLOOD LEFT ARM Performed at [place] [address] [place], [place] [address]    Special Requests   Final    BOTTLES DRAWN AEROBIC AND ANAEROBIC Blood Culture results may not be optimal due to an inadequate volume of blood received in culture bottles Performed at [place] [address], [place], [place] [address]    Culture  Setup Time   Final    GRAM POSITIVE COCCI IN CLUSTERS IN BOTH AEROBIC AND ANAEROBIC BOTTLES CRITICAL RESULT CALLED TO, READ BACK BY AND VERIFIED WITH: SCOTT CHRISTY PHARMD '@0850'  03/05/21 EB Performed at [facility], [place] [address], [place],  [address]    Culture STAPHYLOCOCCUS AUREUS (A)  Final   Report Status 03/07/2021 FINAL  Final   Organism ID, Bacteria STAPHYLOCOCCUS AUREUS  Final      Susceptibility   Staphylococcus aureus - MIC*    CIPROFLOXACIN <=0.5 SENSITIVE Sensitive     ERYTHROMYCIN <=0.25  SENSITIVE Sensitive     GENTAMICIN <=0.5 SENSITIVE Sensitive     OXACILLIN <=0.25 SENSITIVE Sensitive     TETRACYCLINE >=16 RESISTANT Resistant     VANCOMYCIN 1 SENSITIVE Sensitive     TRIMETH/SULFA <=10 SENSITIVE Sensitive     CLINDAMYCIN <=0.25 SENSITIVE Sensitive     RIFAMPIN <=0.5 SENSITIVE Sensitive     Inducible Clindamycin NEGATIVE Sensitive     * STAPHYLOCOCCUS AUREUS  Blood Culture ID Panel (Reflexed)     Status: Abnormal   Collection Time: 03/02/2021  3:30 PM  Result Value Ref Range Status   Enterococcus faecalis NOT DETECTED NOT DETECTED Final   Enterococcus Faecium NOT DETECTED NOT DETECTED Final   Listeria monocytogenes NOT DETECTED NOT DETECTED Final   Staphylococcus species DETECTED (A) NOT DETECTED Final    Comment: CRITICAL RESULT CALLED TO, READ BACK BY AND VERIFIED WITH: SCOTT CHRISTY PHARMD '@0850'  03/05/21 EB    Staphylococcus aureus (BCID) DETECTED (A) NOT DETECTED Final    Comment: CRITICAL RESULT CALLED TO, READ BACK BY AND VERIFIED WITH: SCOTT CHRISTY PHARMD '@0850'  03/05/21 EB    Staphylococcus epidermidis NOT DETECTED NOT DETECTED Final   Staphylococcus lugdunensis NOT DETECTED NOT DETECTED Final   Streptococcus species NOT DETECTED NOT DETECTED Final   Streptococcus agalactiae NOT DETECTED NOT DETECTED Final  Streptococcus pneumoniae NOT DETECTED NOT DETECTED Final   Streptococcus pyogenes NOT DETECTED NOT DETECTED Final   A.calcoaceticus-baumannii NOT DETECTED NOT DETECTED Final   Bacteroides fragilis NOT DETECTED NOT DETECTED Final   Enterobacterales NOT DETECTED NOT DETECTED Final   Enterobacter cloacae complex NOT DETECTED NOT DETECTED Final   Escherichia coli NOT DETECTED NOT DETECTED Final   Klebsiella aerogenes NOT DETECTED NOT DETECTED Final   Klebsiella oxytoca NOT DETECTED NOT DETECTED Final   Klebsiella pneumoniae NOT DETECTED NOT DETECTED Final   Proteus species NOT DETECTED NOT DETECTED Final   Salmonella species NOT DETECTED NOT DETECTED Final    Serratia marcescens NOT DETECTED NOT DETECTED Final   Haemophilus influenzae NOT DETECTED NOT DETECTED Final   Neisseria meningitidis NOT DETECTED NOT DETECTED Final   Pseudomonas aeruginosa NOT DETECTED NOT DETECTED Final   Stenotrophomonas maltophilia NOT DETECTED NOT DETECTED Final   Candida albicans NOT DETECTED NOT DETECTED Final   Candida auris NOT DETECTED NOT DETECTED Final   Candida glabrata NOT DETECTED NOT DETECTED Final   Candida krusei NOT DETECTED NOT DETECTED Final   Candida parapsilosis NOT DETECTED NOT DETECTED Final   Candida tropicalis NOT DETECTED NOT DETECTED Final   Cryptococcus neoformans/gattii NOT DETECTED NOT DETECTED Final   Meth resistant mecA/C and MREJ NOT DETECTED NOT DETECTED Final    Comment: Performed at [facility], [address], [place], [place] [address]  Blood Culture (routine x 2)     Status: Abnormal   Collection Time: 03/17/2021  3:33 PM   Specimen: BLOOD  Result Value Ref Range Status   Specimen Description   Final    BLOOD RIGHT ANTECUBITAL Performed at [place] [address] [place], [place] [address]    Special Requests   Final    BOTTLES DRAWN AEROBIC AND ANAEROBIC Blood Culture results may not be optimal due to an inadequate volume of blood received in culture bottles Performed at [place] [address], [place], [place] [address]    Culture  Setup Time   Final    GRAM POSITIVE COCCI IN CLUSTERS ANAEROBIC BOTTLE ONLY CRITICAL VALUE NOTED.  VALUE IS CONSISTENT WITH PREVIOUSLY REPORTED AND CALLED VALUE.    Culture (A)  Final    STAPHYLOCOCCUS AUREUS SUSCEPTIBILITIES PERFORMED ON PREVIOUS CULTURE WITHIN THE LAST 5 DAYS. Performed at [facility], [place] [address], [place], [place] [address]    Report Status 03/07/2021 FINAL  Final  MRSA PCR Screening     Status: None   Collection Time: 03/05/21  8:19 AM   Specimen: Nasal Mucosa; Nasopharyngeal  Result Value Ref Range  Status   MRSA by PCR NEGATIVE NEGATIVE Final    Comment:        The GeneXpert MRSA Assay (FDA approved for NASAL specimens only), is one component of a comprehensive MRSA colonization surveillance program. It is not intended to diagnose MRSA infection nor to guide or monitor treatment for MRSA infections. Performed at [facility], [place] [address] [place], [place] [address]   Culture, blood (Routine X 2) w Reflex to ID Panel     Status: None (Preliminary result)   Collection Time: 03/05/21  2:22 PM   Specimen: BLOOD  Result Value Ref Range Status   Specimen Description   Final    BLOOD LEFT ANTECUBITAL Performed at [place] [address], [place], [place] [address]    Special Requests   Final    BOTTLES  DRAWN AEROBIC ONLY Blood Culture adequate volume Performed at [place] [address] [place], [place] [address]    Culture   Final    NO GROWTH 4 DAYS Performed at [facility], [place] [address], [place], [place] [address]    Report Status PENDING  Incomplete  Culture, blood (Routine X 2) w Reflex to ID Panel     Status: None (Preliminary result)   Collection Time: 03/05/21  2:24 PM   Specimen: BLOOD LEFT HAND  Result Value Ref Range Status   Specimen Description   Final    BLOOD LEFT HAND Performed at [place] [address] [place], [place] [address]    Special Requests   Final    BOTTLES DRAWN AEROBIC ONLY Blood Culture results may not be optimal due to an inadequate volume of blood received in culture bottles Performed at [place] [address], [place], [place] [address]    Culture   Final    NO GROWTH 4 DAYS Performed at [facility], [place] [address] [place], [place] [address]    Report Status PENDING  Incomplete      Studies: DG Chest Port 1 View  Result Date: 03/16/2021 CLINICAL DATA:  Dyspnea EXAM: PORTABLE CHEST 1 VIEW  COMPARISON:  03/07/2021 FINDINGS: Cardiac shadow is within normal limits. The lungs are well aerated bilaterally. No focal infiltrate or sizable effusion is seen. The vascular congestion has improved when compared with the prior study. Overall aeration has improved as well. IMPRESSION: Improved aeration with decreased vascular congestion. No new focal abnormality is noted. Electronically Signed   By: Inez Catalina M.D.   On: 03/16/2021 17:35    Scheduled Meds: . (feeding supplement) PROSource Plus  30 mL Oral BID BM  . Chlorhexidine Gluconate Cloth  6 each Topical Daily  . feeding supplement  237 mL Oral BID BM  . lactulose  20 g Oral BID  . mouth rinse  15 mL Mouth Rinse BID  . multivitamin with minerals  1 tablet Oral Daily    Continuous Infusions: . sodium chloride Stopped (03/09/21 0103)  .  ceFAZolin (ANCEF) IV Stopped (03/09/21 0844)  . sodium chloride irrigation       LOS: 5 days     Kayleen Memos, MD [facility] Pager [phone]  If 7PM-7AM, please contact night-coverage [email or web address] Password [facility] 03/09/2021, 12:33 PM

## 2021-03-09 NOTE — Plan of Care (Signed)
  Problem: Education: Goal: Knowledge of General Education information will improve Description: Including pain rating scale, medication(s)/side effects and non-pharmacologic comfort measures Outcome: Progressing   Problem: Health Behavior/Discharge Planning: Goal: Ability to manage health-related needs will improve Outcome: Progressing   Problem: Clinical Measurements: Goal: Ability to maintain clinical measurements within normal limits will improve Outcome: Progressing Goal: Will remain free from infection Outcome: Progressing Goal: Diagnostic test results will improve Outcome: Progressing Goal: Respiratory complications will improve Outcome: Progressing Goal: Cardiovascular complication will be avoided Outcome: Progressing   Problem: Activity: Goal: Risk for activity intolerance will decrease Outcome: Progressing   Problem: Nutrition: Goal: Adequate nutrition will be maintained Outcome: Progressing   Problem: Coping: Goal: Level of anxiety will decrease Outcome: Progressing   Problem: Elimination: Goal: Will not experience complications related to bowel motility Outcome: Progressing Goal: Will not experience complications related to urinary retention Outcome: Progressing   Problem: Pain Managment: Goal: General experience of comfort will improve Outcome: Progressing   Problem: Safety: Goal: Ability to remain free from injury will improve Outcome: Progressing   Problem: Skin Integrity: Goal: Risk for impaired skin integrity will decrease Outcome: Progressing   Problem: Fluid Volume: Goal: Hemodynamic stability will improve Outcome: Progressing   Problem: Clinical Measurements: Goal: Diagnostic test results will improve Outcome: Progressing Goal: Signs and symptoms of infection will decrease Outcome: Progressing   Problem: Respiratory: Goal: Ability to maintain adequate ventilation will improve Outcome: Progressing   Problem: Education: Goal:  Knowledge of disease and its progression will improve Outcome: Progressing   Problem: Health Behavior/Discharge Planning: Goal: Ability to manage health-related needs will improve Outcome: Progressing   Problem: Clinical Measurements: Goal: Complications related to the disease process or treatment will be avoided or minimized Outcome: Progressing Goal: Dialysis access will remain free of complications Outcome: Progressing   Problem: Activity: Goal: Activity intolerance will improve Outcome: Progressing   Problem: Fluid Volume: Goal: Fluid volume balance will be maintained or improved Outcome: Progressing   Problem: Nutritional: Goal: Ability to make appropriate dietary choices will improve Outcome: Progressing   Problem: Respiratory: Goal: Respiratory symptoms related to disease process will be avoided Outcome: Progressing   Problem: Self-Concept: Goal: Body image disturbance will be avoided or minimized Outcome: Progressing   Problem: Urinary Elimination: Goal: Progression of disease will be identified and treated Outcome: Progressing   

## 2021-03-09 NOTE — Progress Notes (Signed)
[facility]   Reason for visit: Follow up on bacteremia  Interval History: repeat cultures ngtd; s/p debridment of the wrist yesterday, feels a little 'sore'.  Day 6 antibiotics Day 5 cefazolin   Physical Exam: Constitutional:  Vitals:   03/09/21 1000 03/09/21 1100  BP: 105/61 106/69  Pulse: (!) 112 (!) 112  Resp: (!) 23 (!) 30  Temp:    SpO2: 96% 96%   patient appears in NAD Respiratory: Normal respiratory effort; CTA B Cardiovascular: RRR GI: soft, nt, nd MS: left wrist wrapped  Review of Systems: Constitutional: negative for fevers and chills Gastrointestinal: negative for nausea and diarrhea  Lab Results  Component Value Date   WBC 22.5 (H) 03/09/2021   HGB 7.2 (L) 03/09/2021   HCT 21.5 (L) 03/09/2021   MCV 90.8 03/09/2021   PLT 85 (L) 03/09/2021    Lab Results  Component Value Date   CREATININE 1.68 (H) 03/09/2021   BUN 39 (H) 03/09/2021   NA 133 (L) 03/09/2021   K 5.0 03/09/2021   CL 102 03/09/2021   CO2 22 03/09/2021    Lab Results  Component Value Date   ALT 19 03/09/2021   AST 73 (H) 03/09/2021   ALKPHOS 77 03/09/2021     Microbiology: Recent Results (from the past 240 hour(s))  Resp Panel by RT-PCR (Flu A&B, Covid) Nasopharyngeal Swab     Status: None   Collection Time: 03/19/2021  3:28 PM   Specimen: Nasopharyngeal Swab; Nasopharyngeal(NP) swabs in vial transport medium  Result Value Ref Range Status   SARS Coronavirus 2 by RT PCR NEGATIVE NEGATIVE Final    Comment: (NOTE) SARS-CoV-2 target nucleic acids are NOT DETECTED.  The SARS-CoV-2 RNA is generally detectable in upper respiratory specimens during the acute phase of infection. The lowest concentration of SARS-CoV-2 viral copies this assay can detect is 138 copies/mL. A negative result does not preclude SARS-Cov-2 infection and should not be used as the sole basis for treatment or other patient management decisions. A negative result may occur with   improper specimen collection/handling, submission of specimen other than nasopharyngeal swab, presence of viral mutation(s) within the areas targeted by this assay, and inadequate number of viral copies(<138 copies/mL). A negative result must be combined with clinical observations, patient history, and epidemiological information. The expected result is Negative.  Fact Sheet for Patients:  BloggerCourse.com  Fact Sheet for Healthcare Providers:  SeriousBroker.it  This test is no t yet approved or cleared by the [place] FDA and  has been authorized for detection and/or diagnosis of SARS-CoV-2 by FDA under an Emergency Use Authorization (EUA). This EUA will remain  in effect (meaning this test can be used) for the duration of the COVID-19 declaration under Section 564(b)(1) of the Act, 21 U.S.C.section 360bbb-3(b)(1), unless the authorization is terminated  or revoked sooner.       Influenza A by PCR NEGATIVE NEGATIVE Final   Influenza B by PCR NEGATIVE NEGATIVE Final    Comment: (NOTE) The Xpert Xpress SARS-CoV-2/FLU/RSV plus assay is intended as an aid in the diagnosis of influenza from Nasopharyngeal swab specimens and should not be used as a sole basis for treatment. Nasal washings and aspirates are unacceptable for Xpert Xpress SARS-CoV-2/FLU/RSV testing.  Fact Sheet for Patients: BloggerCourse.com  Fact Sheet for Healthcare Providers: SeriousBroker.it  This test is not yet approved or cleared by the [place] FDA and has been authorized for detection and/or diagnosis of SARS-CoV-2 by FDA under an  Emergency Use Authorization (EUA). This EUA will remain in effect (meaning this test can be used) for the duration of the COVID-19 declaration under Section 564(b)(1) of the Act, 21 U.S.C. section 360bbb-3(b)(1), unless the authorization is terminated  or revoked.  Performed at [facility], [address], [place], [place] [address]   Urine culture     Status: Abnormal   Collection Time: 03/07/2021  3:28 PM   Specimen: In/Out Cath Urine  Result Value Ref Range Status   Specimen Description   Final    IN/OUT CATH URINE Performed at [facility], [address] [place], [place] [address]    Special Requests   Final    NONE Performed at [facility], [address] [place], [place] [address]    Culture >=100,000 COLONIES/mL STAPHYLOCOCCUS AUREUS (A)  Final   Report Status 03/07/2021 FINAL  Final   Organism ID, Bacteria STAPHYLOCOCCUS AUREUS (A)  Final      Susceptibility   Staphylococcus aureus - MIC*    CIPROFLOXACIN <=0.5 SENSITIVE Sensitive     GENTAMICIN <=0.5 SENSITIVE Sensitive     NITROFURANTOIN <=16 SENSITIVE Sensitive     OXACILLIN <=0.25 SENSITIVE Sensitive     TETRACYCLINE >=16 RESISTANT Resistant     VANCOMYCIN 1 SENSITIVE Sensitive     TRIMETH/SULFA <=10 SENSITIVE Sensitive     CLINDAMYCIN <=0.25 SENSITIVE Sensitive     RIFAMPIN <=0.5 SENSITIVE Sensitive     Inducible Clindamycin NEGATIVE Sensitive     * >=100,000 COLONIES/mL STAPHYLOCOCCUS AUREUS  Blood Culture (routine x 2)     Status: Abnormal   Collection Time: 03/12/2021  3:30 PM   Specimen: BLOOD  Result Value Ref Range Status   Specimen Description   Final    BLOOD LEFT ARM Performed at [facility], [address], [place], [place] [address]    Special Requests   Final    BOTTLES DRAWN AEROBIC AND ANAEROBIC Blood Culture results may not be optimal due to an inadequate volume of blood received in culture bottles Performed at [facility], [address] [place], [place] [address]    Culture  Setup Time   Final    GRAM POSITIVE COCCI IN CLUSTERS IN BOTH AEROBIC AND ANAEROBIC BOTTLES CRITICAL RESULT CALLED TO, READ BACK BY AND VERIFIED WITH: SCOTT CHRISTY  PHARMD @0850  03/05/21 EB Performed at [facility], [address] [place]    Culture STAPHYLOCOCCUS AUREUS (A)  Final   Report Status 03/07/2021 FINAL  Final   Organism ID, Bacteria STAPHYLOCOCCUS AUREUS  Final      Susceptibility   Staphylococcus aureus - MIC*    CIPROFLOXACIN <=0.5 SENSITIVE Sensitive     ERYTHROMYCIN <=0.25 SENSITIVE Sensitive     GENTAMICIN <=0.5 SENSITIVE Sensitive     OXACILLIN <=0.25 SENSITIVE Sensitive     TETRACYCLINE >=16 RESISTANT Resistant     VANCOMYCIN 1 SENSITIVE Sensitive     TRIMETH/SULFA <=10 SENSITIVE Sensitive     CLINDAMYCIN <=0.25 SENSITIVE Sensitive     RIFAMPIN <=0.5 SENSITIVE Sensitive     Inducible Clindamycin NEGATIVE Sensitive     * STAPHYLOCOCCUS AUREUS  Blood Culture ID Panel (Reflexed)     Status: Abnormal   Collection Time: 03/01/2021  3:30 PM  Result Value Ref Range Status   Enterococcus faecalis NOT DETECTED NOT DETECTED Final   Enterococcus Faecium NOT DETECTED NOT DETECTED Final   Listeria monocytogenes NOT DETECTED NOT DETECTED Final  Staphylococcus species DETECTED (A) NOT DETECTED Final    Comment: CRITICAL RESULT CALLED TO, READ BACK BY AND VERIFIED WITH: SCOTT CHRISTY PHARMD @0850  03/05/21 EB    Staphylococcus aureus (BCID) DETECTED (A) NOT DETECTED Final    Comment: CRITICAL RESULT CALLED TO, READ BACK BY AND VERIFIED WITH: SCOTT CHRISTY PHARMD @0850  03/05/21 EB    Staphylococcus epidermidis NOT DETECTED NOT DETECTED Final   Staphylococcus lugdunensis NOT DETECTED NOT DETECTED Final   Streptococcus species NOT DETECTED NOT DETECTED Final   Streptococcus agalactiae NOT DETECTED NOT DETECTED Final   Streptococcus pneumoniae NOT DETECTED NOT DETECTED Final   Streptococcus pyogenes NOT DETECTED NOT DETECTED Final   A.calcoaceticus-baumannii NOT DETECTED NOT DETECTED Final   Bacteroides fragilis NOT DETECTED NOT DETECTED Final   Enterobacterales NOT DETECTED NOT DETECTED Final   Enterobacter cloacae  complex NOT DETECTED NOT DETECTED Final   Escherichia coli NOT DETECTED NOT DETECTED Final   Klebsiella aerogenes NOT DETECTED NOT DETECTED Final   Klebsiella oxytoca NOT DETECTED NOT DETECTED Final   Klebsiella pneumoniae NOT DETECTED NOT DETECTED Final   Proteus species NOT DETECTED NOT DETECTED Final   Salmonella species NOT DETECTED NOT DETECTED Final   Serratia marcescens NOT DETECTED NOT DETECTED Final   Haemophilus influenzae NOT DETECTED NOT DETECTED Final   Neisseria meningitidis NOT DETECTED NOT DETECTED Final   Pseudomonas aeruginosa NOT DETECTED NOT DETECTED Final   Stenotrophomonas maltophilia NOT DETECTED NOT DETECTED Final   Candida albicans NOT DETECTED NOT DETECTED Final   Candida auris NOT DETECTED NOT DETECTED Final   Candida glabrata NOT DETECTED NOT DETECTED Final   Candida krusei NOT DETECTED NOT DETECTED Final   Candida parapsilosis NOT DETECTED NOT DETECTED Final   Candida tropicalis NOT DETECTED NOT DETECTED Final   Cryptococcus neoformans/gattii NOT DETECTED NOT DETECTED Final   Meth resistant mecA/C and MREJ NOT DETECTED NOT DETECTED Final    Comment: Performed at [facility], [address] [place], [place] [address]  Blood Culture (routine x 2)     Status: Abnormal   Collection Time: 03/13/2021  3:33 PM   Specimen: BLOOD  Result Value Ref Range Status   Specimen Description   Final    BLOOD RIGHT ANTECUBITAL Performed at [facility], [address] [place], [place] [address]    Special Requests   Final    BOTTLES DRAWN AEROBIC AND ANAEROBIC Blood Culture results may not be optimal due to an inadequate volume of blood received in culture bottles Performed at [facility], [address] [place], [place] [address]    Culture  Setup Time   Final    GRAM POSITIVE COCCI IN CLUSTERS ANAEROBIC BOTTLE ONLY CRITICAL VALUE NOTED.  VALUE IS CONSISTENT WITH PREVIOUSLY REPORTED AND CALLED VALUE.    Culture (A)  Final     STAPHYLOCOCCUS AUREUS SUSCEPTIBILITIES PERFORMED ON PREVIOUS CULTURE WITHIN THE LAST 5 DAYS. Performed at [facility], [address] [place], [place] [address]    Report Status 03/07/2021 FINAL  Final  MRSA PCR Screening     Status: None   Collection Time: 03/05/21  8:19 AM   Specimen: Nasal Mucosa; Nasopharyngeal  Result Value Ref Range Status   MRSA by PCR NEGATIVE NEGATIVE Final    Comment:        The GeneXpert MRSA Assay (FDA approved for NASAL specimens only), is one component of a comprehensive MRSA colonization surveillance program. It is not intended to diagnose MRSA infection nor  to guide or monitor treatment for MRSA infections. Performed at [facility], [address] [place], [place] [address]   Culture, blood (Routine X 2) w Reflex to ID Panel     Status: None (Preliminary result)   Collection Time: 03/05/21  2:22 PM   Specimen: BLOOD  Result Value Ref Range Status   Specimen Description   Final    BLOOD LEFT ANTECUBITAL Performed at [facility], [address], [place], [place] [address]    Special Requests   Final    BOTTLES DRAWN AEROBIC ONLY Blood Culture adequate volume Performed at [facility], [address], [place], [place] [address]    Culture   Final    NO GROWTH 4 DAYS Performed at [facility], [address], [place], [place] [address]    Report Status PENDING  Incomplete  Culture, blood (Routine X 2) w Reflex to ID Panel     Status: None (Preliminary result)   Collection Time: 03/05/21  2:24 PM   Specimen: BLOOD LEFT HAND  Result Value Ref Range Status   Specimen Description   Final    BLOOD LEFT HAND Performed at [facility], [address] [place], [place] [address]    Special Requests   Final    BOTTLES DRAWN AEROBIC ONLY Blood Culture results may not be optimal due to an inadequate volume of blood received in culture bottles Performed at  [facility], [address], [place], [place] [address]    Culture   Final    NO GROWTH 4 DAYS Performed at [facility], [address], [place], [place] [address]    Report Status PENDING  Incomplete    Impression/Plan:  1. 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  On cefazolin and will need a prolonged treatment of 6 weeks with presumed endocarditis/emboli to multiple sites.  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    2.  Wrist septic arthritis - s/p debridment by Dr. Izora Ribas and stable.  On treatment as above.  3.  Hemorrhagic mass of right kidney - related to above and managed by urology as well.   Some continued blood loss and coagulopathy.

## 2021-03-09 NOTE — Progress Notes (Addendum)
Occupational Therapy Treatment Patient Details Name: Greg Bean MRN: [id number] DOB: 27-Nov-1965 Today's Date: 03/09/2021    History of present illness 55 year old male with medical history of alcohol abuse and COVID-19. He presented to the ED with hematuria, flank pain, weight loss, and confusion. He was dx with SIRS, liver cirrhosis with splenic varices, coagulopathy, hyperbilirubinemia, and AKI. MRI of L wrist and forearm showed small abscess and complex joint effusion concerning for septic arthritis,renal hematoma and gross hematuria.For I and D of left arm 03/20/2021   OT comments  Patient just back to bed after being up in chair with nursing and not agreeable to out of bed activity. Rn reports patient desatting with minimal activity and weak. Patient left hand dressing in ace wrap from palmar crease all the way up forearm with drain present. Patient provided with exercises to perform in order to maintain ROM and strength of left non-dominant hand. Patient performed 10 ball squeezes, tolerated finger extension stretch (digits 2-5) x 3, finger to thumb reps and thumb circumduction movement. Patient verbalized understanding of exercises and activities to perform. Patient's affect flat and communication short and brief. At times patient's responses not inline with question asked. Will f/u in regards to HEP to maximize patient's retention of instruction/education.     Follow Up Recommendations  Other (comment) (Patient not agreeable to out of bed activity. Will maintain current recomendations but suspect may need to update POC.)    Equipment Recommendations  Other (comment) (TBD)    Recommendations for Other Services      Precautions / Restrictions Precautions Precautions: Fall Precaution Comments: bladder irrigation Restrictions Weight Bearing Restrictions: No       Mobility Bed Mobility                    Transfers                      Balance                                            ADL either performed or assessed with clinical judgement   ADL                                               Vision   Vision Assessment?: No apparent visual deficits   Perception     Praxis      Cognition Arousal/Alertness: Awake/alert Behavior During Therapy: Flat affect Overall Cognitive Status: Difficult to assess                                 General Comments: Patient communication with therapist limited. At times his repsonses are not in line with therapist question. Unsure of cause.        Exercises Other Exercises Other Exercises: Ball squeezes x 10, finger extension stretch, thumb circumduction, finger to thumb coordination reps   Shoulder Instructions       General Comments      Pertinent Vitals/ Pain       Pain Assessment: Faces Faces Pain Scale: Hurts a little bit Pain Location: left wrist with movement Pain Descriptors / Indicators: Discomfort Pain Intervention(s): Limited activity within patient's tolerance  Home Living                                          Prior Functioning/Environment              Frequency  Min 2X/week        Progress Toward Goals  OT Goals(current goals can now be found in the care plan section)  Progress towards OT goals: OT to reassess next treatment  Acute Rehab OT Goals Patient Stated Goal: did not state Potential to Achieve Goals: Fair  Plan Discharge plan remains appropriate    Co-evaluation                 AM-PAC OT "6 Clicks" Daily Activity     Outcome Measure   Help from another person eating meals?: A Little Help from another person taking care of personal grooming?: A Little Help from another person toileting, which includes using toliet, bedpan, or urinal?: A Lot Help from another person bathing (including washing, rinsing, drying)?: A Lot Help from another person to put on and taking off  regular upper body clothing?: A Little Help from another person to put on and taking off regular lower body clothing?: A Lot 6 Click Score: 15    End of Session    OT Visit Diagnosis: Unsteadiness on feet (R26.81);Pain   Activity Tolerance Patient tolerated treatment well   Patient Left in bed;with bed alarm set;with call bell/phone within reach;with family/visitor present   Nurse Communication  (okay to see per RN)        Time: 1300-1309 OT Time Calculation (min): 9 min  Charges: OT General Charges $OT Visit: 1 Visit OT Treatments $Therapeutic Exercise: 8-22 mins  Waldron Session, OTR/L Acute Care Rehab Services  Office [phone] Pager: [phone]    Kelli Churn 03/09/2021, 1:39 PM

## 2021-03-10 ENCOUNTER — Inpatient Hospital Stay (HOSPITAL_COMMUNITY): Payer: 59 | Admitting: Certified Registered Nurse Anesthetist

## 2021-03-10 DIAGNOSIS — R7881 Bacteremia: Secondary | ICD-10-CM | POA: Diagnosis not present

## 2021-03-10 DIAGNOSIS — B9561 Methicillin susceptible Staphylococcus aureus infection as the cause of diseases classified elsewhere: Secondary | ICD-10-CM | POA: Diagnosis not present

## 2021-03-10 LAB — GLUCOSE, CAPILLARY: Glucose-Capillary: 45 mg/dL — ABNORMAL LOW (ref 70–99)

## 2021-03-10 LAB — COMPREHENSIVE METABOLIC PANEL
ALT: 11 U/L (ref 0–44)
AST: 68 U/L — ABNORMAL HIGH (ref 15–41)
Albumin: 1.7 g/dL — ABNORMAL LOW (ref 3.5–5.0)
Alkaline Phosphatase: 79 U/L (ref 38–126)
Anion gap: 11 (ref 5–15)
BUN: 59 mg/dL — ABNORMAL HIGH (ref 6–20)
CO2: 20 mmol/L — ABNORMAL LOW (ref 22–32)
Calcium: 7.9 mg/dL — ABNORMAL LOW (ref 8.9–10.3)
Chloride: 97 mmol/L — ABNORMAL LOW (ref 98–111)
Creatinine, Ser: 2.2 mg/dL — ABNORMAL HIGH (ref 0.61–1.24)
GFR, Estimated: 35 mL/min — ABNORMAL LOW (ref >60)
Glucose, Bld: 106 mg/dL — ABNORMAL HIGH (ref 70–99)
Potassium: 3.7 mmol/L (ref 3.5–5.1)
Sodium: 128 mmol/L — ABNORMAL LOW (ref 135–145)
Total Bilirubin: 4.3 mg/dL — ABNORMAL HIGH (ref 0.3–1.2)
Total Protein: 7.5 g/dL (ref 6.5–8.1)

## 2021-03-10 LAB — CULTURE, BLOOD (ROUTINE X 2)
Culture: NO GROWTH
Culture: NO GROWTH
Special Requests: ADEQUATE

## 2021-03-10 LAB — HEMOGLOBIN AND HEMATOCRIT, BLOOD
HCT: 19.6 % — ABNORMAL LOW (ref 39.0–52.0)
HCT: 21.1 % — ABNORMAL LOW (ref 39.0–52.0)
Hemoglobin: 6.8 g/dL — CL (ref 13.0–17.0)
Hemoglobin: 7.3 g/dL — ABNORMAL LOW (ref 13.0–17.0)

## 2021-03-10 LAB — PREPARE RBC (CROSSMATCH)

## 2021-03-10 MED ORDER — SODIUM CHLORIDE 0.9% IV SOLUTION: Freq: Once | INTRAVENOUS | Status: AC

## 2021-03-10 MED ORDER — PROSOURCE PLUS PO LIQD
30.0000 mL | Freq: Three times a day (TID) | ORAL | Status: DC
  Filled 2021-03-10: qty 30

## 2021-03-10 MED ORDER — METOPROLOL TARTRATE 5 MG/5ML IV SOLN
2.5000 mg | Freq: Four times a day (QID) | INTRAVENOUS | Status: DC | PRN
  Administered 2021-03-10: 2.5 mg via INTRAVENOUS
  Filled 2021-03-10: qty 5

## 2021-03-10 MED ORDER — VITAMIN K1 10 MG/ML IJ SOLN
5.0000 mg | Freq: Every day | INTRAVENOUS | Status: DC
  Administered 2021-03-10: 5 mg via INTRAVENOUS
  Filled 2021-03-10: qty 0.5

## 2021-03-10 MED ORDER — AMLODIPINE BESYLATE 5 MG PO TABS
5.0000 mg | ORAL_TABLET | Freq: Every day | ORAL | Status: DC
  Filled 2021-03-10: qty 1

## 2021-03-10 MED ORDER — SIMETHICONE 80 MG PO CHEW
80.0000 mg | CHEWABLE_TABLET | Freq: Four times a day (QID) | ORAL | Status: DC
  Administered 2021-03-10: 80 mg via ORAL
  Filled 2021-03-10 (×2): qty 1

## 2021-03-10 MED FILL — Medication: Qty: 1 | Status: AC

## 2021-03-11 LAB — TYPE AND SCREEN
ABO/RH(D): B POS
Antibody Screen: NEGATIVE
Unit division: 0
Unit division: 0
Unit division: 0
Unit division: 0

## 2021-03-11 LAB — BPAM RBC
Blood Product Expiration Date: 202206102359
Blood Product Expiration Date: 202206102359
Blood Product Expiration Date: 202206142359
Blood Product Expiration Date: 202206142359
ISSUE DATE / TIME: 202205171920
ISSUE DATE / TIME: 202205180108
ISSUE DATE / TIME: 202205181315
ISSUE DATE / TIME: 202205190901
Unit Type and Rh: 7300
Unit Type and Rh: 7300
Unit Type and Rh: 7300
Unit Type and Rh: 7300

## 2021-03-11 MED FILL — Medication: Qty: 1 | Status: AC

## 2021-03-11 NOTE — Progress Notes (Signed)
Greg Bean, was contacted and informed of patient's death.  Telephone number for patient's mother is not working.  Kodi knocked on mother's door, but no one answered.  The number we have in the system is the same number Kodi has.  She will continue to try and make contact with patient's mother.  Kodi came to the hospital and spent approximately 40 minutes with patient before leaving.

## 2021-03-11 NOTE — Progress Notes (Signed)
Attempted to call Pt mother again. No answer. Voicemail was left.

## 2021-03-14 LAB — BPAM FFP
Blood Product Expiration Date: 202205222359
Blood Product Expiration Date: 202205222359
ISSUE DATE / TIME: 202205171333
Unit Type and Rh: 7300
Unit Type and Rh: 7300

## 2021-03-14 LAB — PREPARE FRESH FROZEN PLASMA: Unit division: 0

## 2021-03-23 NOTE — Progress Notes (Addendum)
Patient complained of breathing difficulty.  Respirations increased as well as diaphoresis.  Oxygen saturations decreased to 82%.  Patient's oxygen intake was increased from 6L on [place] to 10 L with a salter.  Patient's O2 level improved to upper 90's.  Cold cloth was applied to forehead.  Patient believes he experienced a panic attack.  This RN began to make a Progress Note concerning the change in oxygen demand.  This RN turned to check on the patient and noticed he had a fixed stare.  The patient's HR dropped into the 40's.  Chest compression were started and a Code Blue initiated.  During the code the following medications were administered: 4 doses of epinephrine 2 doses of calcium 2 doses of bicarbonate

## 2021-03-23 NOTE — Progress Notes (Addendum)
PROGRESS NOTE  Kairos Panetta KDT:[id number] DOB: May 25, 1966 DOA: 02/24/2021 PCP: Pcp, No  HPI/Recap of past 74 hours: 55 year old M with no PMH other than previous alcohol abuse and COVID-19 illness presenting with hematuria, flank pain, weight loss and confusion, and found to have SIRS, liver cirrhosis with splenic varices, coagulopathy, hyperbilirubinemia, AKI,  acute blood loss anemia due to renal hematoma and gross hematuria.   Patient received 5 units of PRBC, 7 units of FFP and multiple lines of IV vitamin K for coagulopathy.  Started on IV ceftriaxone for SIRS, and IVF for AKI.  GI, urology and infectious disease following.  Blood culture +MSSA.  MRI left wrist with small abscess and complex joint effusion concerning for septic arthritis. TTE without vegetation or significant finding. ID de-escalated antibiotic from ceftriaxone to Ancef and recommending a minimum of 6 weeks from negative blood culture.  Repeat blood culture NGTD.  Post I&D for his left wrist septic arthritis.  On CBI per urology.  April 09, 2021: Patient was seen and examined at his bedside.  Left wrist pain about 8 out of 10 prior to taking his pain medication.  He denies any abdominal pain, states he had a bowel movement this morning and felt better.      Assessment/Plan: Principal Problem:   MSSA bacteremia Active Problems:   Renal hemorrhage, right   SIRS (systemic inflammatory response syndrome) (HCC)   Cirrhosis of liver (HCC)   ABLA (acute blood loss anemia)   AKI (acute kidney injury) (HCC)   Anemia   Thrombocytopenia (HCC)   Staphylococcal arthritis of left wrist (HCC)  Severe sepsis due to MSSA bacteremia/MSSA UTI/left wrist septic arthritis/small left pronator abscess-all POA.  Met criteria with fever, tachycardia, leukocytosis and multiorgan failure including coagulopathy, hyperbilirubinemia, encephalopathy and lactic acidosis.  Blood cultures with MSSA.  TTE without significant finding.  MRI left wrist  with complex effusion raising concern for septic arthritis and a small abscess.  Repeat blood culture negative to date. -Post washout of his left wrist on 2021-04-09 -Ceftriaxone>> Ancef.  ID recommends minimum of 6 weeks for the date of negative blood culture -Patient needs PICC line before discharge. -Appreciate general surgery and ID's assistance.  Presumptive infective endocarditis with septic emboli Likely showering emboli into both kidneys which is causing upper tract bleeding per urology Day#6 of unasyn Plan for 6 weeks course, end date 04/15/21 Appreciate ID's assistance  Worsening AKI with hematuria Baseline creatinine appears to be 0.8 with GFR greater than 60 Creatinine increased to 1.68 with GFR of 48. Avoid dehydration, hypotension, nephrotoxic agents. Currently on continuous bladder irrigation. Encourage oral intake as tolerated. Monitor urine output Net I&O +5.2 L. Repeat chemistry panel. Per urology, hematuria will likely persists until coagulopathy is corrected.  Coagulopathy 2/2 to decompensated liver cirrhosis Last INR 2.4 on 03/09/21 Start IV vitamin K 5 mg daily x 3 days Repeat INR in the AM  Acute hypoxic respiratory failure secondary to bilateral pleural effusions seen on CT scan 03/07/2021.:  Personally reviewed CT scan.  Showing bilateral small pleural effusions.  Incentive spirometer. Out of bed to chair with every shift Ambulate as tolerated.  Acute blood loss anemia superimposed on anemia of chronic disease Coagulopathy-likely due to cirrhosis and sepsis. Renal hemorrhage/gross hematuria: Noted on initial CT with interval development on repeat CT concerning for multifocal hemorrhagic renal infarcts.   Received 5 units of blood so far -Received 5 units of FFP.  Ordered 2 more units of FFP -Has been receiving IV vitamin K daily-but did  not seem to be helping -On CBI per urology-urine seems to be pink light -Needs outpatient MRI  -Monitor H&H, and coag  labs.  Liver cirrhosis with splenic varices Likely from chronic alcohol use.  Reportedly quit drinking about 6 months ago.   Acute hepatitis panel negative.   Low suspicion for SBP. -Appreciate input by GI.   No plan for EGD at this time  Elevated LFT/hyperbilirubinemia Repeat CMP. Avoid hepatotoxic agent as possible.  Uncontrolled hypertention/sinus tachycardia Started Norvasc 5 mg daily IV lopressor prn with parameters  Severe protein calorie malnutrition Albumin 1.8, BMI 21. Severe muscle mass loss Increase oral protein calorie intake.  Resolved hepatic encephalopathy-Oriented x4.  Ammonia 48>>>20 -Continue lactulose 20 g 3 times daily-titrate for 2-3 bowel movements a day -At the time of this visit, he is alert oriented x3.  Leukocytosis/bandemia: Likely due to #1.  Leukocytosis, suspect reactive.  Repeat CBC.  Monitor fever curve and WBC.  Thrombocytopenia:  Likely due to liver cirrhosis and acute illness -Continue monitoring -Platelet count 85,000. Repeat CBC.  Debility/physical deconditioning -PT/OT -Fall precautions Mobilize as tolerated  Severe protein calorie malnutrition Severe muscle mass loss. Albumin 1.8. Body mass index is 21.29 kg/m. Nutrition Problem: Increased nutrient needs Etiology: acute illness Signs/Symptoms: estimated needs Interventions: Ensure Enlive (each supplement provides 350kcal and 20 grams of protein),Prostat,MVI   DVT prophylaxis:  SCDs Start: 03/14/2021 2147  Code Status: Full code  Family Communication:  None at bedside.  Level of care: Stepdown Status is: Inpatient  Remains inpatient appropriate because:Hemodynamically unstable, Ongoing diagnostic testing needed not appropriate for outpatient work up, Unsafe d/c plan, IV treatments appropriate due to intensity of illness or inability to take PO and Inpatient level of care appropriate due to severity of illness   Dispo: The patient is from: Home   Anticipated d/c is to: Home after infectious disease signs off.  Patient currently is not medically stable to d/c.              Difficult to place patient No       Consultants:  Infectious disease Urology Gastroenterology Hand surgery         Objective: Vitals:   04-08-2021 1200 04-08-2021 1300 2021-04-08 1351 04-08-2021 1400  BP: (!) 149/87  (!) 152/94 (!) 161/95  Pulse: (!) 105 (!) 107 (!) 112 (!) 112  Resp: (!) 30 (!) 32 (!) 25 (!) 30  Temp:      TempSrc:      SpO2: 92% 91% (!) 88% (!) 89%  Weight:      Height:        Intake/Output Summary (Last 24 hours) at 08-Apr-2021 1414 Last data filed at 04-08-21 1400 Gross per 24 hour  Intake 25913.28 ml  Output 24860 ml  Net 1053.28 ml   Filed Weights   03/05/2021 1451 03/05/21 0040 02/23/2021 1803  Weight: 60.3 kg 65.4 kg 65.4 kg    Exam:  . General: 55 y.o. year-old male frail-appearing no acute distress.  He is alert and oriented x3.   . Cardiovascular: Regular rate and rhythm no rubs or gallops.   Marland Kitchen Respiratory: Mild rales at bases no wheezing noted.  Poor inspiratory effort. . Abdomen: Nontender bowel sounds present.   . Musculoskeletal: No lower extremity edema bilaterally.  Left wrist in surgical dressing.   . Skin: Left wrist in surgical dressing. Marland Kitchen Psychiatry: Mood is appropriate for condition setting.   Data Reviewed: CBC: Recent Labs  Lab 03/19/2021 1530 03/05/21 0824 03/07/21 0135 03/07/21 8003 03/07/21  4585 03/07/21 1702 03/22/2021 0256 02/28/2021 1137 03/09/21 0746 03/09/21 1119 03/09/21 1748 03/09/21 2325 03-14-2021 0651  WBC 18.9*   < > 17.0* 22.7* 22.7*  --  18.2*  --  22.5*  --   --   --   --   NEUTROABS 16.2*  --   --   --   --   --   --   --   --   --   --   --   --   HGB 7.8*   < > 8.7* 7.0* 6.6*   < > 8.3*  8.2*   < > 7.7* 7.2* 7.7* 7.0* 6.8*  HCT 23.2*   < > 25.4* 21.2* 19.4*   < > 24.4*  24.3*   < > 22.8* 21.5* 23.1* 21.0* 19.6*  MCV 91.0   < > 90.7 93.8 91.9  --   91.0  --  90.8  --   --   --   --   PLT 62*   < > 106* 98* 92*  --  81*  --  85*  --   --   --   --    < > = values in this interval not displayed.   Basic Metabolic Panel: Recent Labs  Lab 03/21/2021 1619 03/05/21 0824 03/06/21 0329 03/07/21 0637 03/22/2021 0256 03/09/21 0746  NA 126* 133* 133* 129* 131* 133*  K 3.9 3.8 3.7 3.9 4.0 5.0  CL 95* 101 103 98 100 102  CO2 22 24 21* _0 GLUCOSE 108* 101* 122* 123* 106* 134*  BUN 41* 38* 31* 25* 21* 39*  CREATININE 1.50* 1.22 1.07 1.22 0.81 1.68*  CALCIUM 7.7* 8.2* 8.0* 7.9* 7.9* 7.8*  MG 2.0  --  1.9 1.7 1.8 1.8  PHOS  --   --  3.0 2.7 2.9  --    GFR: Estimated Creatinine Clearance: 46 mL/min (A) (by C-G formula based on SCr of 1.68 mg/dL (H)). Liver Function Tests: Recent Labs  Lab 03/05/21 0824 03/06/21 0329 03/07/21 0637 02/28/2021 0256 03/09/21 0746  AST 64* 59* 70* 64* 73*  ALT 37 32 _1 ALKPHOS 85 85 102 87 77  BILITOT 6.5* 5.2* 4.4* 4.2* 5.8*  PROT 8.0 7.7 7.4 7.2 7.0  ALBUMIN 1.8* 1.6* 1.6* 1.7* 1.8*   Recent Labs  Lab 03/13/2021 1619  LIPASE 47   Recent Labs  Lab 02/26/2021 2127 03/06/21 0329 03/06/2021 0256  AMMONIA 48* 47* 20   Coagulation Profile: Recent Labs  Lab 03/07/21 0637 02/27/2021 0256 03/01/2021 1137 02/25/2021 1732 03/09/21 0746  INR 2.3* 1.9* 2.1* 2.1* 2.4*   Cardiac Enzymes: No results for input(s): CKTOTAL, CKMB, CKMBINDEX, TROPONINI in the last 168 hours. BNP (last 3 results) No results for input(s): PROBNP in the last 8760 hours. HbA1C: No results for input(s): HGBA1C in the last 72 hours. CBG: No results for input(s): GLUCAP in the last 168 hours. Lipid Profile: No results for input(s): CHOL, HDL, LDLCALC, TRIG, CHOLHDL, LDLDIRECT in the last 72 hours. Thyroid Function Tests: No results for input(s): TSH, T4TOTAL, FREET4, T3FREE, THYROIDAB in the last 72 hours. Anemia Panel: No results for input(s): VITAMINB12, FOLATE, FERRITIN, TIBC, IRON, RETICCTPCT in the last 72  hours. Urine analysis:    Component Value Date/Time   COLORURINE RED (A) 02/21/2021 1528   APPEARANCEUR HAZY (A) 03/07/2021 1528   LABSPEC 1.011 03/14/2021 1528   PHURINE 6.0 02/26/2021 1528   GLUCOSEU 50 (A) 02/27/2021 1528   HGBUR MODERATE (  A) 02/25/2021 [place] 03/17/2021 [place] 03/12/2021 1528   PROTEINUR 100 (A) 02/24/2021 1528   NITRITE NEGATIVE 03/09/2021 [place] 03/07/2021 1528   Sepsis Labs: _0 (procalcitonin:4,lacticidven:4)  ) Recent Results (from the past 240 hour(s))  Resp Panel by RT-PCR (Flu A&B, Covid) Nasopharyngeal Swab     Status: None   Collection Time: 03/03/2021  3:28 PM   Specimen: Nasopharyngeal Swab; Nasopharyngeal(NP) swabs in vial transport medium  Result Value Ref Range Status   SARS Coronavirus 2 by RT PCR NEGATIVE NEGATIVE Final    Comment: (NOTE) SARS-CoV-2 target nucleic acids are NOT DETECTED.  The SARS-CoV-2 RNA is generally detectable in upper respiratory specimens during the acute phase of infection. The lowest concentration of SARS-CoV-2 viral copies this assay can detect is 138 copies/mL. A negative result does not preclude SARS-Cov-2 infection and should not be used as the sole basis for treatment or other patient management decisions. A negative result may occur with  improper specimen collection/handling, submission of specimen other than nasopharyngeal swab, presence of viral mutation(s) within the areas targeted by this assay, and inadequate number of viral copies(<138 copies/mL). A negative result must be combined with clinical observations, patient history, and epidemiological information. The expected result is Negative.  Fact Sheet for Patients:  EntrepreneurPulse.com.au  Fact Sheet for Healthcare Providers:  IncredibleEmployment.be  This test is no t yet approved or cleared by the [place] FDA and  has been authorized  for detection and/or diagnosis of SARS-CoV-2 by FDA under an Emergency Use Authorization (EUA). This EUA will remain  in effect (meaning this test can be used) for the duration of the COVID-19 declaration under Section 564(b)(1) of the Act, 21 U.S.C.section 360bbb-3(b)(1), unless the authorization is terminated  or revoked sooner.       Influenza A by PCR NEGATIVE NEGATIVE Final   Influenza B by PCR NEGATIVE NEGATIVE Final    Comment: (NOTE) The Xpert Xpress SARS-CoV-2/FLU/RSV plus assay is intended as an aid in the diagnosis of influenza from Nasopharyngeal swab specimens and should not be used as a sole basis for treatment. Nasal washings and aspirates are unacceptable for Xpert Xpress SARS-CoV-2/FLU/RSV testing.  Fact Sheet for Patients: EntrepreneurPulse.com.au  Fact Sheet for Healthcare Providers: IncredibleEmployment.be  This test is not yet approved or cleared by the [place] FDA and has been authorized for detection and/or diagnosis of SARS-CoV-2 by FDA under an Emergency Use Authorization (EUA). This EUA will remain in effect (meaning this test can be used) for the duration of the COVID-19 declaration under Section 564(b)(1) of the Act, 21 U.S.C. section 360bbb-3(b)(1), unless the authorization is terminated or revoked.  Performed at [facility], [place] [address], [place], [place] [address]   Urine culture     Status: Abnormal   Collection Time: 02/27/2021  3:28 PM   Specimen: In/Out Cath Urine  Result Value Ref Range Status   Specimen Description   Final    IN/OUT CATH URINE Performed at [facility] [address], [place], [place] [address]    Special Requests   Final    NONE Performed at [facility], [place] [address], [place], [place] [address]    Culture >=100,000 COLONIES/mL STAPHYLOCOCCUS AUREUS (A)  Final   Report Status 03/07/2021 FINAL  Final   Organism ID,  Bacteria STAPHYLOCOCCUS AUREUS (A)  Final      Susceptibility   Staphylococcus aureus - MIC*    CIPROFLOXACIN <=  0.5 SENSITIVE Sensitive     GENTAMICIN <=0.5 SENSITIVE Sensitive     NITROFURANTOIN <=16 SENSITIVE Sensitive     OXACILLIN <=0.25 SENSITIVE Sensitive     TETRACYCLINE >=16 RESISTANT Resistant     VANCOMYCIN 1 SENSITIVE Sensitive     TRIMETH/SULFA <=10 SENSITIVE Sensitive     CLINDAMYCIN <=0.25 SENSITIVE Sensitive     RIFAMPIN <=0.5 SENSITIVE Sensitive     Inducible Clindamycin NEGATIVE Sensitive     * >=100,000 COLONIES/mL STAPHYLOCOCCUS AUREUS  Blood Culture (routine x 2)     Status: Abnormal   Collection Time: 03/13/2021  3:30 PM   Specimen: BLOOD  Result Value Ref Range Status   Specimen Description   Final    BLOOD LEFT ARM Performed at [facility] [address], [place], [place] [address]    Special Requests   Final    BOTTLES DRAWN AEROBIC AND ANAEROBIC Blood Culture results may not be optimal due to an inadequate volume of blood received in culture bottles Performed at [facility] [address], [place],  [address]    Culture  Setup Time   Final    GRAM POSITIVE COCCI IN CLUSTERS IN BOTH AEROBIC AND ANAEROBIC BOTTLES CRITICAL RESULT CALLED TO, READ BACK BY AND VERIFIED WITH: SCOTT CHRISTY PHARMD _0  03/05/21 EB Performed at [facility], [place] [address], [place],  [address]    Culture STAPHYLOCOCCUS AUREUS (A)  Final   Report Status 03/07/2021 FINAL  Final   Organism ID, Bacteria STAPHYLOCOCCUS AUREUS  Final      Susceptibility   Staphylococcus aureus - MIC*    CIPROFLOXACIN <=0.5 SENSITIVE Sensitive     ERYTHROMYCIN <=0.25 SENSITIVE Sensitive     GENTAMICIN <=0.5 SENSITIVE Sensitive     OXACILLIN <=0.25 SENSITIVE Sensitive     TETRACYCLINE >=16 RESISTANT Resistant     VANCOMYCIN 1 SENSITIVE Sensitive     TRIMETH/SULFA <=10 SENSITIVE Sensitive     CLINDAMYCIN <=0.25 SENSITIVE Sensitive      RIFAMPIN <=0.5 SENSITIVE Sensitive     Inducible Clindamycin NEGATIVE Sensitive     * STAPHYLOCOCCUS AUREUS  Blood Culture ID Panel (Reflexed)     Status: Abnormal   Collection Time: 03/20/2021  3:30 PM  Result Value Ref Range Status   Enterococcus faecalis NOT DETECTED NOT DETECTED Final   Enterococcus Faecium NOT DETECTED NOT DETECTED Final   Listeria monocytogenes NOT DETECTED NOT DETECTED Final   Staphylococcus species DETECTED (A) NOT DETECTED Final    Comment: CRITICAL RESULT CALLED TO, READ BACK BY AND VERIFIED WITH: SCOTT CHRISTY PHARMD _1  03/05/21 EB    Staphylococcus aureus (BCID) DETECTED (A) NOT DETECTED Final    Comment: CRITICAL RESULT CALLED TO, READ BACK BY AND VERIFIED WITH: SCOTT CHRISTY PHARMD _2  03/05/21 EB    Staphylococcus epidermidis NOT DETECTED NOT DETECTED Final   Staphylococcus lugdunensis NOT DETECTED NOT DETECTED Final   Streptococcus species NOT DETECTED NOT DETECTED Final   Streptococcus agalactiae NOT DETECTED NOT DETECTED Final   Streptococcus pneumoniae NOT DETECTED NOT DETECTED Final   Streptococcus pyogenes NOT DETECTED NOT DETECTED Final   A.calcoaceticus-baumannii NOT DETECTED NOT DETECTED Final   Bacteroides fragilis NOT DETECTED NOT DETECTED Final   Enterobacterales NOT DETECTED NOT DETECTED Final   Enterobacter cloacae complex NOT DETECTED NOT DETECTED Final   Escherichia coli NOT DETECTED NOT DETECTED Final   Klebsiella aerogenes NOT DETECTED NOT DETECTED Final   Klebsiella oxytoca NOT DETECTED NOT DETECTED Final   Klebsiella pneumoniae NOT  DETECTED NOT DETECTED Final   Proteus species NOT DETECTED NOT DETECTED Final   Salmonella species NOT DETECTED NOT DETECTED Final   Serratia marcescens NOT DETECTED NOT DETECTED Final   Haemophilus influenzae NOT DETECTED NOT DETECTED Final   Neisseria meningitidis NOT DETECTED NOT DETECTED Final   Pseudomonas aeruginosa NOT DETECTED NOT DETECTED Final   Stenotrophomonas maltophilia NOT DETECTED NOT  DETECTED Final   Candida albicans NOT DETECTED NOT DETECTED Final   Candida auris NOT DETECTED NOT DETECTED Final   Candida glabrata NOT DETECTED NOT DETECTED Final   Candida krusei NOT DETECTED NOT DETECTED Final   Candida parapsilosis NOT DETECTED NOT DETECTED Final   Candida tropicalis NOT DETECTED NOT DETECTED Final   Cryptococcus neoformans/gattii NOT DETECTED NOT DETECTED Final   Meth resistant mecA/C and MREJ NOT DETECTED NOT DETECTED Final    Comment: Performed at [facility], [place] [address] [place], [place] [address]  Blood Culture (routine x 2)     Status: Abnormal   Collection Time: 03/18/2021  3:33 PM   Specimen: BLOOD  Result Value Ref Range Status   Specimen Description   Final    BLOOD RIGHT ANTECUBITAL Performed at [facility] [address] [place], [place] [address]    Special Requests   Final    BOTTLES DRAWN AEROBIC AND ANAEROBIC Blood Culture results may not be optimal due to an inadequate volume of blood received in culture bottles Performed at [facility] [address], [place], [place] [address]    Culture  Setup Time   Final    GRAM POSITIVE COCCI IN CLUSTERS ANAEROBIC BOTTLE ONLY CRITICAL VALUE NOTED.  VALUE IS CONSISTENT WITH PREVIOUSLY REPORTED AND CALLED VALUE.    Culture (A)  Final    STAPHYLOCOCCUS AUREUS SUSCEPTIBILITIES PERFORMED ON PREVIOUS CULTURE WITHIN THE LAST 5 DAYS. Performed at [facility], [place] [address] [place], [place] [address]    Report Status 03/07/2021 FINAL  Final  MRSA PCR Screening     Status: None   Collection Time: 03/05/21  8:19 AM   Specimen: Nasal Mucosa; Nasopharyngeal  Result Value Ref Range Status   MRSA by PCR NEGATIVE NEGATIVE Final    Comment:        The GeneXpert MRSA Assay (FDA approved for NASAL specimens only), is one component of a comprehensive MRSA colonization surveillance program. It is not intended to diagnose MRSA infection nor to guide  or monitor treatment for MRSA infections. Performed at [facility], [place] [address], [place], [place] [address]   Culture, blood (Routine X 2) w Reflex to ID Panel     Status: None   Collection Time: 03/05/21  2:22 PM   Specimen: BLOOD  Result Value Ref Range Status   Specimen Description   Final    BLOOD LEFT ANTECUBITAL Performed at [facility] [address] [place], [place] [address]    Special Requests   Final    BOTTLES DRAWN AEROBIC ONLY Blood Culture adequate volume Performed at [facility] [address], [place], [place] [address]    Culture   Final    NO GROWTH 5 DAYS Performed at [facility], [place] [address] [place],  [address]    Report Status 2021/04/06 FINAL  Final  Culture, blood (Routine X 2) w Reflex to ID Panel     Status: None   Collection Time: 03/05/21  2:24 PM   Specimen: BLOOD  LEFT HAND  Result Value Ref Range Status   Specimen Description   Final    BLOOD LEFT HAND Performed at [facility] [address], [place], [place] [address]    Special Requests   Final    BOTTLES DRAWN AEROBIC ONLY Blood Culture results may not be optimal due to an inadequate volume of blood received in culture bottles Performed at [facility] [address] [place], [place] [address]    Culture   Final    NO GROWTH 5 DAYS Performed at [facility], [place] [address], [place], [place] [address]    Report Status 2021-04-09 FINAL  Final      Studies: No results found.  Scheduled Meds: . (feeding supplement) PROSource Plus  30 mL Oral TID BM  . Chlorhexidine Gluconate Cloth  6 each Topical Daily  . lactulose  20 g Oral BID  . mouth rinse  15 mL Mouth Rinse BID  . multivitamin with minerals  1 tablet Oral Daily    Continuous Infusions: . sodium chloride Stopped (2021/04/09 1008)  .  ceFAZolin (ANCEF) IV Stopped (Apr 09, 2021 0844)  . sodium chloride  irrigation       LOS: 6 days     Kayleen Memos, MD [facility] Pager [phone]  If 7PM-7AM, please contact night-coverage [email or web address] Password [facility] 04-09-21, 2:14 PM

## 2021-03-23 NOTE — Progress Notes (Signed)
Nutrition Follow-up  DOCUMENTATION CODES:   Non-severe (moderate) malnutrition in context of chronic illness  INTERVENTION:  - will d/c Ensure per patient request.  - will increase 30 ml Prosource Plus from BID to TID, each supplement provides 100 kcal and 15 grams protein. - diet advancement as medically feasible.    NUTRITION DIAGNOSIS:   Moderate Malnutrition related to chronic illness (cirrhosis) as evidenced by moderate fat depletion,moderate muscle depletion. -revised, ongoing  GOAL:   Patient will meet greater than or equal to 90% of their needs -unmet, inable to meet with CLD  MONITOR:   PO intake,Supplement acceptance,Diet advancement,Labs,Weight trends  REASON FOR ASSESSMENT:   Consult Assessment of nutrition requirement/status  ASSESSMENT:   55 year old male with medical history of alcohol abuse and COVID-19. He presented to the ED with hematuria, flank pain, weight loss, and confusion. He was dx with SIRS, liver cirrhosis with splenic varices, coagulopathy, hyperbilirubinemia, and AKI. MRI of L wrist and forearm showed small abscess and complex joint effusion concerning for septic arthritis.  Diet changed from FLD to NPO on 5/17 at 0400 and advanced to CLD yesterday at 0757. No intakes documented while on FLD or since advancement to CLD.   Patient laying in bed with no family or visitors present. Breakfast tray at bedside, untouched. Patient declines RD's offer to set up tray for him. He is not interested in consuming anything at this time.  He reports that appetite has been very poor for the past 3 weeks and that even if he felt hungry, he was only able to eat 1-2 bites of something. He was unable to provide clear details/answers when asked what was preventing him from eating (pain, nausea, early satiety, etc.). It has been 3-4 weeks since he had a meal and reports he has not had any solid food during that time frame.  No chewing or swallowing pain or difficulties  at baseline. Abdomen is still distended and feels gassy but feels better than yesterday, per patient. He denies nausea.  UBW reported as 180 lb and that he has lost weight from this weight over the past 3 weeks. Weight on 5/13 documented as 133 lb and on 5/17 as 144 lb. This would indicate 36 lb weight loss (20% body weight) in 3 weeks.  He is POD #2 L wrist I&D and drain placement.  Per notes: - severe sepsis d/t MSSA bacteremia - L wrist septic arthritis - AKI - acute blood loss anemia superimposed on anemia of chronic disease   Labs reviewed; Na: 133 mmol/l, BUN: 39 mg/dl, creatinine: 7.90 mg/dl, Ca: 7.8 mg/dl, GFR: 48 ml/min.  Medications reviewed; 20 g lactulose BID, 1 tablet multivitamin with minerals/day.     NUTRITION - FOCUSED PHYSICAL EXAM:  Flowsheet Row Most Recent Value  Orbital Region Mild depletion  Upper Arm Region Moderate depletion  Thoracic and Lumbar Region Unable to assess  Buccal Region Moderate depletion  Temple Region Moderate depletion  Clavicle Bone Region Moderate depletion  Clavicle and Acromion Bone Region Moderate depletion  Scapular Bone Region Mild depletion  Dorsal Hand Mild depletion  Patellar Region Mild depletion  Anterior Thigh Region Moderate depletion  Posterior Calf Region Mild depletion  Edema (RD Assessment) None  Hair Reviewed  Eyes Reviewed  Mouth Reviewed  Skin Reviewed  Nails Reviewed       Diet Order:   Diet Order            Diet clear liquid Room service appropriate? Yes; Fluid consistency: Thin  Diet  effective now                 EDUCATION NEEDS:   Education needs have been addressed  Skin:  Skin Assessment: Skin Integrity Issues: Skin Integrity Issues:: Incisions Incisions: L hand (5/17)  Last BM:  5/19 (type 6 x2)  Height:   Ht Readings from Last 1 Encounters:  02/26/2021 5\' 9"  (1.753 m)    Weight:   Wt Readings from Last 1 Encounters:  03/19/2021 65.4 kg    Estimated Nutritional Needs:  Kcal:   2050-2300 kcal Protein:  110-125 grams Fluid:  >/= 2.2 L/day      03/19/2021, MS, RD, LDN, CNSC Inpatient Clinical Dietitian RD pager # available in [facility]  After hours/weekend pager # available in [facility]

## 2021-03-23 NOTE — Anesthesia Procedure Notes (Signed)
Procedure Name: Intubation Date/Time: 09-Apr-2021 11:07 PM Performed by: Montel Clock, CRNA Pre-anesthesia Checklist: Patient identified, Emergency Drugs available, Suction available, Patient being monitored and Timeout performed Patient Re-evaluated:Patient Re-evaluated prior to induction Oxygen Delivery Method: Ambu bag Preoxygenation: Pre-oxygenation with 100% oxygen Ventilation: Mask ventilation without difficulty Laryngoscope Size: 3 and Mac Grade View: Grade I Tube type: Oral Tube size: 7.0 mm Number of attempts: 1 Airway Equipment and Method: Stylet Placement Confirmation: ETT inserted through vocal cords under direct vision,  positive ETCO2 and breath sounds checked- equal and bilateral Secured at: 24 cm Tube secured with: Tape Dental Injury: Teeth and Oropharynx as per pre-operative assessment  Comments: After intubation, gastric secretions noted in ETT. RT suctioned ETT.

## 2021-03-23 NOTE — Progress Notes (Signed)
Na came back 128, and pt has been having poor oral intake d/t Nausea and GI pain/gas. Dr. Margo Aye notified by this RN. No new orders received from MD d/t bladder irrigation. RN will continue to carefully monitor pt and assess for changes.

## 2021-03-23 NOTE — ED Provider Notes (Signed)
.  Critical Care Performed by: Sabas Sous, MD Authorized by: Sabas Sous, MD   Critical care provider statement:    Critical care time (minutes):  34   Critical care was necessary to treat or prevent imminent or life-threatening deterioration of the following conditions: cardiac arrest.   Critical care was time spent personally by me on the following activities:  Discussions with consultants, evaluation of patient's response to treatment, examination of patient, ordering and performing treatments and interventions, ordering and review of laboratory studies, ordering and review of radiographic studies, pulse oximetry, re-evaluation of patient's condition, obtaining history from patient or surrogate and review of old charts CPR  Date/Time: 2021-03-16 11:35 PM Performed by: Sabas Sous, MD Authorized by: Sabas Sous, MD  CPR Procedure Details:      Amount of time prior to administration of ACLS/BLS (minutes):  0   ACLS/BLS initiated by EMS: No     CPR/ACLS performed in the ED: No     Duration of CPR (minutes):  25   Outcome: Pt declared dead    CPR performed via ACLS guidelines under my direct supervision.  See RN documentation for details including defibrillator use, medications, doses and timing. Ultrasound ED Echo  Date/Time: 03-16-2021 11:40 PM Performed by: Sabas Sous, MD Authorized by: Sabas Sous, MD   Procedure details:    Indications: cardiac arrest     Views: apical 4 chamber view     Images: not archived   Findings:    Pericardium: no pericardial effusion     Cardiac Activity: no cardiac activity     Called to ICU for CODE BLUE, cardiac arrest.  Patient suddenly became short of breath, felt very anxious, was put on nonrebreather, shortly after became unresponsive, pulseless.  On my arrival CPR had been in process for 9 minutes, asystole during this time.  ACLS continued, there was report that patient was a "renal patient" and so calcium chloride and  bicarbonate given as well.  There was 1 rhythm check where the asystole could have been interpreted as a fine V. fib and so defibrillation attempted x1 at 120 J.  Difficulty obtaining an accurate history or hospital course, able to pull up patient's chart, admitted for renal hemorrhage, transfused yesterday.  Patient's Foley bag is filled with a lightly sanguinous blood.  Now that the hospital course is better understood, there is concern the patient has hemorrhaged, leading to a cardiac arrhythmia and asystole.  Patient has now received epinephrine x4, bicarb x2, calcium chloride x2.  Remains in an unfavorable rhythm.  Patient was intubated prior to my arrival, bagging without issue.  PE considered but felt unlikely and treatment with tenecteplase felt to be more of a danger than the benefit given his hemorrhage.  Cardiac ultrasound utilized with 3 separate evaluations there is complete cardiac standstill.  Emergent blood transfusion was of course considered but at this point with no cardiac activity, very low end-tidal CO2, unfavorable initial rhythm, felt to be futile.  Time of death called at 11:26 PM.    Sabas Sous, MD 03-16-2021 [phone]

## 2021-03-23 NOTE — TOC Progression Note (Signed)
Transition of Care [facility]) - Progression Note    Patient Details  Name: Greg Bean MRN: [id number] Date of Birth: 02/19/1966  Transition of Care [facility]) CM/SW Contact  Golda Acre, RN Phone Number: 03/16/2021, 7:20 AM  Clinical Narrative:     DATE OF PROCEDURE: 02/27/2021  PREOPERATIVE DIAGNOSIS:  Septic left wrist.  POSTOPERATIVE DIAGNOSIS:  Septic left wrist.  PROCEDURE: 1.  Incision and drainage of left wrist including open drainage of a small abscess volar wrist, arthrotomy with radiocarpal joint washout, arthrotomy with distal radioulnar joint washout. 2.  Placement of 15-French drain.  folloiwing for progression and home needs may need hhc for iv abx      Expected Discharge Plan and Services                                                 Social Determinants of Health (SDOH) Interventions    Readmission Risk Interventions No flowsheet data found.

## 2021-03-23 NOTE — Progress Notes (Signed)
[facility]   Reason for visit: Follow up on bacteremia  Interval History: repeat cultures ngtd from 5/14.  WBC 22.5, remains afebrile.   Day 7 antibiotics Day 6 cefazolin  Physical Exam: Constitutional:  Vitals:   03/05/2021 1351 03/21/2021 1400  BP: (!) 152/94 (!) 161/95  Pulse: (!) 112 (!) 112  Resp: (!) 25 (!) 30  Temp:    SpO2: (!) 88% (!) 89%   patient appears in NAD Respiratory: Normal respiratory effort; CTA B Cardiovascular: RRR GI: soft, nt, some mild distention MS: left wrist  Review of Systems: Constitutional: negative for fevers and chills Integument/breast: negative for rash  Lab Results  Component Value Date   WBC 22.5 (H) 03/09/2021   HGB 6.8 (LL) 03/17/2021   HCT 19.6 (L) 03/19/2021   MCV 90.8 03/09/2021   PLT 85 (L) 03/09/2021    Lab Results  Component Value Date   CREATININE 1.68 (H) 03/09/2021   BUN 39 (H) 03/09/2021   NA 133 (L) 03/09/2021   K 5.0 03/09/2021   CL 102 03/09/2021   CO2 22 03/09/2021    Lab Results  Component Value Date   ALT 19 03/09/2021   AST 73 (H) 03/09/2021   ALKPHOS 77 03/09/2021     Microbiology: Recent Results (from the past 240 hour(s))  Resp Panel by RT-PCR (Flu A&B, Covid) Nasopharyngeal Swab     Status: None   Collection Time: 02/27/2021  3:28 PM   Specimen: Nasopharyngeal Swab; Nasopharyngeal(NP) swabs in vial transport medium  Result Value Ref Range Status   SARS Coronavirus 2 by RT PCR NEGATIVE NEGATIVE Final    Comment: (NOTE) SARS-CoV-2 target nucleic acids are NOT DETECTED.  The SARS-CoV-2 RNA is generally detectable in upper respiratory specimens during the acute phase of infection. The lowest concentration of SARS-CoV-2 viral copies this assay can detect is 138 copies/mL. A negative result does not preclude SARS-Cov-2 infection and should not be used as the sole basis for treatment or other patient management decisions. A negative result may occur with  improper  specimen collection/handling, submission of specimen other than nasopharyngeal swab, presence of viral mutation(s) within the areas targeted by this assay, and inadequate number of viral copies(<138 copies/mL). A negative result must be combined with clinical observations, patient history, and epidemiological information. The expected result is Negative.  Fact Sheet for Patients:  BloggerCourse.com  Fact Sheet for Healthcare Providers:  SeriousBroker.it  This test is no t yet approved or cleared by the [place] FDA and  has been authorized for detection and/or diagnosis of SARS-CoV-2 by FDA under an Emergency Use Authorization (EUA). This EUA will remain  in effect (meaning this test can be used) for the duration of the COVID-19 declaration under Section 564(b)(1) of the Act, 21 U.S.C.section 360bbb-3(b)(1), unless the authorization is terminated  or revoked sooner.       Influenza A by PCR NEGATIVE NEGATIVE Final   Influenza B by PCR NEGATIVE NEGATIVE Final    Comment: (NOTE) The Xpert Xpress SARS-CoV-2/FLU/RSV plus assay is intended as an aid in the diagnosis of influenza from Nasopharyngeal swab specimens and should not be used as a sole basis for treatment. Nasal washings and aspirates are unacceptable for Xpert Xpress SARS-CoV-2/FLU/RSV testing.  Fact Sheet for Patients: BloggerCourse.com  Fact Sheet for Healthcare Providers: SeriousBroker.it  This test is not yet approved or cleared by the [place] FDA and has been authorized for detection and/or diagnosis of SARS-CoV-2 by FDA under an  Emergency Use Authorization (EUA). This EUA will remain in effect (meaning this test can be used) for the duration of the COVID-19 declaration under Section 564(b)(1) of the Act, 21 U.S.C. section 360bbb-3(b)(1), unless the authorization is terminated or revoked.  Performed at  [facility], [address], [place], [place] [address]   Urine culture     Status: Abnormal   Collection Time: 02/23/2021  3:28 PM   Specimen: In/Out Cath Urine  Result Value Ref Range Status   Specimen Description   Final    IN/OUT CATH URINE Performed at [facility], [address], [place], [place] [address]    Special Requests   Final    NONE Performed at [facility], [address] [place], [place] [address]    Culture >=100,000 COLONIES/mL STAPHYLOCOCCUS AUREUS (A)  Final   Report Status 03/07/2021 FINAL  Final   Organism ID, Bacteria STAPHYLOCOCCUS AUREUS (A)  Final      Susceptibility   Staphylococcus aureus - MIC*    CIPROFLOXACIN <=0.5 SENSITIVE Sensitive     GENTAMICIN <=0.5 SENSITIVE Sensitive     NITROFURANTOIN <=16 SENSITIVE Sensitive     OXACILLIN <=0.25 SENSITIVE Sensitive     TETRACYCLINE >=16 RESISTANT Resistant     VANCOMYCIN 1 SENSITIVE Sensitive     TRIMETH/SULFA <=10 SENSITIVE Sensitive     CLINDAMYCIN <=0.25 SENSITIVE Sensitive     RIFAMPIN <=0.5 SENSITIVE Sensitive     Inducible Clindamycin NEGATIVE Sensitive     * >=100,000 COLONIES/mL STAPHYLOCOCCUS AUREUS  Blood Culture (routine x 2)     Status: Abnormal   Collection Time: 03/18/2021  3:30 PM   Specimen: BLOOD  Result Value Ref Range Status   Specimen Description   Final    BLOOD LEFT ARM Performed at [facility], [address], [place], [place] [address]    Special Requests   Final    BOTTLES DRAWN AEROBIC AND ANAEROBIC Blood Culture results may not be optimal due to an inadequate volume of blood received in culture bottles Performed at [facility], [address], [place], [place] [address]    Culture  Setup Time   Final    GRAM POSITIVE COCCI IN CLUSTERS IN BOTH AEROBIC AND ANAEROBIC BOTTLES CRITICAL RESULT CALLED TO, READ BACK BY AND VERIFIED WITH: SCOTT CHRISTY PHARMD @0850  03/05/21  EB Performed at [facility], [address] [place], [place]    Culture STAPHYLOCOCCUS AUREUS (A)  Final   Report Status 03/07/2021 FINAL  Final   Organism ID, Bacteria STAPHYLOCOCCUS AUREUS  Final      Susceptibility   Staphylococcus aureus - MIC*    CIPROFLOXACIN <=0.5 SENSITIVE Sensitive     ERYTHROMYCIN <=0.25 SENSITIVE Sensitive     GENTAMICIN <=0.5 SENSITIVE Sensitive     OXACILLIN <=0.25 SENSITIVE Sensitive     TETRACYCLINE >=16 RESISTANT Resistant     VANCOMYCIN 1 SENSITIVE Sensitive     TRIMETH/SULFA <=10 SENSITIVE Sensitive     CLINDAMYCIN <=0.25 SENSITIVE Sensitive     RIFAMPIN <=0.5 SENSITIVE Sensitive     Inducible Clindamycin NEGATIVE Sensitive     * STAPHYLOCOCCUS AUREUS  Blood Culture ID Panel (Reflexed)     Status: Abnormal   Collection Time: 03/18/2021  3:30 PM  Result Value Ref Range Status   Enterococcus faecalis NOT DETECTED NOT DETECTED Final   Enterococcus Faecium NOT DETECTED NOT DETECTED Final   Listeria monocytogenes NOT DETECTED NOT DETECTED Final  Staphylococcus species DETECTED (A) NOT DETECTED Final    Comment: CRITICAL RESULT CALLED TO, READ BACK BY AND VERIFIED WITH: SCOTT CHRISTY PHARMD @0850  03/05/21 EB    Staphylococcus aureus (BCID) DETECTED (A) NOT DETECTED Final    Comment: CRITICAL RESULT CALLED TO, READ BACK BY AND VERIFIED WITH: SCOTT CHRISTY PHARMD @0850  03/05/21 EB    Staphylococcus epidermidis NOT DETECTED NOT DETECTED Final   Staphylococcus lugdunensis NOT DETECTED NOT DETECTED Final   Streptococcus species NOT DETECTED NOT DETECTED Final   Streptococcus agalactiae NOT DETECTED NOT DETECTED Final   Streptococcus pneumoniae NOT DETECTED NOT DETECTED Final   Streptococcus pyogenes NOT DETECTED NOT DETECTED Final   A.calcoaceticus-baumannii NOT DETECTED NOT DETECTED Final   Bacteroides fragilis NOT DETECTED NOT DETECTED Final   Enterobacterales NOT DETECTED NOT DETECTED Final   Enterobacter cloacae complex NOT DETECTED  NOT DETECTED Final   Escherichia coli NOT DETECTED NOT DETECTED Final   Klebsiella aerogenes NOT DETECTED NOT DETECTED Final   Klebsiella oxytoca NOT DETECTED NOT DETECTED Final   Klebsiella pneumoniae NOT DETECTED NOT DETECTED Final   Proteus species NOT DETECTED NOT DETECTED Final   Salmonella species NOT DETECTED NOT DETECTED Final   Serratia marcescens NOT DETECTED NOT DETECTED Final   Haemophilus influenzae NOT DETECTED NOT DETECTED Final   Neisseria meningitidis NOT DETECTED NOT DETECTED Final   Pseudomonas aeruginosa NOT DETECTED NOT DETECTED Final   Stenotrophomonas maltophilia NOT DETECTED NOT DETECTED Final   Candida albicans NOT DETECTED NOT DETECTED Final   Candida auris NOT DETECTED NOT DETECTED Final   Candida glabrata NOT DETECTED NOT DETECTED Final   Candida krusei NOT DETECTED NOT DETECTED Final   Candida parapsilosis NOT DETECTED NOT DETECTED Final   Candida tropicalis NOT DETECTED NOT DETECTED Final   Cryptococcus neoformans/gattii NOT DETECTED NOT DETECTED Final   Meth resistant mecA/C and MREJ NOT DETECTED NOT DETECTED Final    Comment: Performed at [facility], [address] [place], [address] [place]  Blood Culture (routine x 2)     Status: Abnormal   Collection Time: 03/14/2021  3:33 PM   Specimen: BLOOD  Result Value Ref Range Status   Specimen Description   Final    BLOOD RIGHT ANTECUBITAL Performed at [facility], [address], [place], [place]    Special Requests   Final    BOTTLES DRAWN AEROBIC AND ANAEROBIC Blood Culture results may not be optimal due to an inadequate volume of blood received in culture bottles Performed at [facility], [address], [place], [place]    Culture  Setup Time   Final    GRAM POSITIVE COCCI IN CLUSTERS ANAEROBIC BOTTLE ONLY CRITICAL VALUE NOTED.  VALUE IS CONSISTENT WITH PREVIOUSLY REPORTED AND CALLED VALUE.    Culture (A)  Final    STAPHYLOCOCCUS  AUREUS SUSCEPTIBILITIES PERFORMED ON PREVIOUS CULTURE WITHIN THE LAST 5 DAYS. Performed at [facility], [address] [place], [address] [place]    Report Status 03/07/2021 FINAL  Final  MRSA PCR Screening     Status: None   Collection Time: 03/05/21  8:19 AM   Specimen: Nasal Mucosa; Nasopharyngeal  Result Value Ref Range Status   MRSA by PCR NEGATIVE NEGATIVE Final    Comment:        The GeneXpert MRSA Assay (FDA approved for NASAL specimens only), is one component of a comprehensive MRSA colonization surveillance program. It is not intended to diagnose MRSA infection nor  to guide or monitor treatment for MRSA infections. Performed at [facility], [address] [place], [place] [address]   Culture, blood (Routine X 2) w Reflex to ID Panel     Status: None   Collection Time: 03/05/21  2:22 PM   Specimen: BLOOD  Result Value Ref Range Status   Specimen Description   Final    BLOOD LEFT ANTECUBITAL Performed at [facility], [address] [place], [place] [address]    Special Requests   Final    BOTTLES DRAWN AEROBIC ONLY Blood Culture adequate volume Performed at [facility], [address] [place], [place] [address]    Culture   Final    NO GROWTH 5 DAYS Performed at [facility], [address] [place], [place] [address]    Report Status 01/27/21 FINAL  Final  Culture, blood (Routine X 2) w Reflex to ID Panel     Status: None   Collection Time: 03/05/21  2:24 PM   Specimen: BLOOD LEFT HAND  Result Value Ref Range Status   Specimen Description   Final    BLOOD LEFT HAND Performed at [facility], [address] [place], [place] [address]    Special Requests   Final    BOTTLES DRAWN AEROBIC ONLY Blood Culture results may not be optimal due to an inadequate volume of blood received in culture bottles Performed at [facility], [address]  [place], [place] [address]    Culture   Final    NO GROWTH 5 DAYS Performed at [facility], [address] [place], [place] [address]    Report Status 01/27/21 FINAL  Final    Impression/Plan:  1. Disseminated MSSA infection - repeat cultures are ngtd.  He is on cefazolin and needs a 6 week course of treatment through June 24th.  No obvious vegetation on TTE but with emboli, c/w vegetation.  picc line ordered   2.  Wrist septic arthritis - improving now after surgical debridement.  On treatment as above.   3.  Hemorrhagic mass of right kidney - still likely showering emboli into kidneys and getting close monitoring of the Hgb.

## 2021-03-23 NOTE — Progress Notes (Signed)
Received page at 2308 for code blue. According to RN pt suddenly became SOB, anxious and hypoxic. Pt then became unresponsive and pulseless. CPR was done for approximately 24 minutes. TOD 2326. Mother and friend called to inform them of pt passing away. Friend and mother made aware.   Audrea Muscat, NP [facility] 7p-7a [phone]

## 2021-03-23 NOTE — Progress Notes (Signed)
IVT note:  Code blue resuscitative measures ongoing;PT w/ IV Access.

## 2021-03-23 NOTE — Progress Notes (Signed)
PT Cancellation Note  Patient Details Name: Greg Bean MRN: [id number] DOB: 1966/03/09   Cancelled Treatment:    Reason Eval/Treat Not Completed: Medical issues which prohibited therapy;Other (comment) Patient nauseous and BP/HR elevated today. Will follow up at later date/time as schedule allows and pt able.   Wynn Maudlin, DPT Acute Rehabilitation Services Office [phone] Pager [phone]

## 2021-03-23 NOTE — Progress Notes (Signed)
2 Days Post-Op Subjective: No acute events overnight.  H&H remained stable.  He continues to require CBI  Objective: Vital signs in last 24 hours: Temp:  [97.7 F (36.5 C)-99 F (37.2 C)] 98.6 F (37 C) (05/19 1154) Pulse Rate:  [105-113] 106 (05/19 1549) Resp:  [22-37] 31 (05/19 1549) BP: (127-190)/(64-113) 165/96 (05/19 1549) SpO2:  [88 %-96 %] 95 % (05/19 1549)  Intake/Output from previous day: 05/18 0701 - 05/19 0700 In: 25246.1 [P.O.:720; I.V.:146.1; Blood:380] Out: 84132 [Urine:33400; Drains:10]  Intake/Output this shift: Total I/O In: 7387.2 [P.O.:480; I.V.:34.9; Blood:398; GMWNU:2725; IV Piggyback:384.3] Out: 7950 [Urine:7950]  Physical Exam:  General: Alert and oriented Gu: Three-way Foley catheter in place and draining clear to light pink urine with CBI   Lab Results: Recent Labs    03/09/21 2325 03-22-21 0651 03/22/2021 1353  HGB 7.0* 6.8* 7.3*  HCT 21.0* 19.6* 21.1*   BMET Recent Labs    03/09/21 0746 03-22-21 0650  NA 133* 128*  K 5.0 3.7  CL 102 97*  CO2 22 20*  GLUCOSE 134* 106*  BUN 39* 59*  CREATININE 1.68* 2.20*  CALCIUM 7.8* 7.9*     Studies/Results: DG Chest Port 1 View  Result Date: 03/07/2021 CLINICAL DATA:  Dyspnea EXAM: PORTABLE CHEST 1 VIEW COMPARISON:  03/07/2021 FINDINGS: Cardiac shadow is within normal limits. The lungs are well aerated bilaterally. No focal infiltrate or sizable effusion is seen. The vascular congestion has improved when compared with the prior study. Overall aeration has improved as well. IMPRESSION: Improved aeration with decreased vascular congestion. No new focal abnormality is noted. Electronically Signed   By: Alcide Clever M.D.   On: 02/23/2021 17:35   [place] EKG SITE RITE  Result Date: 03/09/2021 If Site Rite image not attached, placement could not be confirmed due to current cardiac rhythm.   Assessment/Plan: 55 year old male with a hemorrhagic mass involving the right kidney associatedwith gross  hematuria along with endocarditis with bacteremia and septic emboli involving multiple organ systems  -Continue CBI.  Patient is likely showering emboli into both kidneys which is causing upper tract bleeding (especially on the right). -Transfuse as needed -Correction of coagulopathy per primary team.  Hematuria will likely persist until his INR is corrected.    LOS: 6 days   Rhoderick Moody, MD [facility] Pager: [phone]  2021/03/22, 4:23 PM

## 2021-03-23 DEATH — deceased

## 2021-03-24 NOTE — Discharge Summary (Signed)
Death Summary  Greg Bean ZOX:[id number] DOB: [birth year] DOA: 03/22/2021  PCP: Pcp, No  Admit date: 03-22-2021 Date of Death: 03/28/2021 Time of Death: 11:26 PM Notification: Pcp, No notified of death of 03-28-2021.  History of present illness:  Greg Bean is a 55 y.o. male with a history of previous alcohol abuse and COVID-19 illness who presented with hematuria, flank pain, weight loss and confusion, and found to have SIRS, liver cirrhosis with splenic varices, coagulopathy, hyperbilirubinemia,AKI,acute blood loss anemiadue to renal hematoma and gross hematuria.  Patient received 5 units of PRBCs,7 units of FFPs and multiple lines of IV vitamin K for coagulopathy. Started on IV ceftriaxone for SIRS, and IVF for AKI.  GI, urology and infectious disease were consulted.  Blood culture +MSSA. MRI left wrist with small abscess and complex joint effusion concerning for septic arthritis.  No obvious vegetation on TTE but with emboli consistent with vegetation.  Repeat cultures were negative to date.  He was on cefazolin with plan for 6-week course of treatment through April 15, 2021.  He was post I&D for his left wrist septic arthritis.  He was on CBI per urology for hemorrhagic mass of right kidney with concern for showering emboli into the kidneys.  The evening of 2021/03/28, provider on-call, Bruna Potter, NP, received a page at [phone] for CODE BLUE.  CPR was done for approximately 24 minutes.    Patient expired on 03-28-21, time of death was 03-11-2325.  Final Diagnoses:  1.   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 -Severe sepsis due to MSSA bacteremia, disseminated MSSA infection. -Septic emboli consistent with vegetation. -Left wrist septic arthritis. -Hemorrhagic mass of right kidney. -AKI with hematuria. -Coagulopathy secondary to decompensated liver cirrhosis. -Liver cirrhosis with splenic  varices. -Severe protein calorie malnutrition. -Thrombocytopenia.   The results of significant diagnostics from this hospitalization (including imaging, microbiology, ancillary and laboratory) are listed below for reference.    Significant Diagnostic Studies: CT ABDOMEN PELVIS WO CONTRAST  Result Date: 03/07/2021 CLINICAL DATA:  Hematuria.  Cirrhosis. EXAM: CT ABDOMEN AND PELVIS WITHOUT CONTRAST TECHNIQUE: Multidetector CT imaging of the abdomen and pelvis was performed following the standard protocol without IV contrast. COMPARISON:  03/05/2021 FINDINGS: Lower chest: Trace bilateral pleural effusions. Dependent bibasilar atelectasis. Heart size is normal. Relative hypoattenuation of the cardiac blood pool indicative of anemia. Trace pericardial effusion. Hepatobiliary: Nodular hepatic surface contour indicative of underlying cirrhosis. No liver lesion evident on unenhanced study. Pancreas: Unremarkable. No pancreatic ductal dilatation or surrounding inflammatory changes. Spleen: Normal in size without focal abnormality. Adrenals/Urinary Tract: Unremarkable adrenal glands. Mixed density intraparenchymal hematoma within the upper pole the right kidney measuring approximately 7.3 cm (remeasured at 6.7 cm in a similar plane on 03/05/2021 exam). 4 mm lower pole right renal stone. No hydronephrosis. Interval development of streaky areas of high attenuation within the subcapsular cortex of both kidneys (series 4, image 55). Urinary bladder wall appears mildly thickened, which may be accentuated by underdistention. Stomach/Bowel: Stomach is within normal limits. No dilated loops of small bowel. Mild long segment wall thickening of the ascending and proximal transverse colon, which may reflect underlying portal colopathy. Vascular/Lymphatic: Upper abdominal varices. Otherwise, no significant vascular findings are evident on non contrasted study. See recent CT angiogram. No enlarged abdominal or pelvic lymph nodes.  Reproductive: Prostate is unremarkable. Other: Small amount of free fluid located dependently within the pelvis, new from prior. No organized abdominopelvic fluid collection. Fat containing paraumbilical hernia.  No free air. Musculoskeletal: No acute or significant osseous findings. IMPRESSION: 1. Interval development of streaky areas of high attenuation within the subcapsular cortex of both kidneys, which may represent multifocal hemorrhagic renal infarcts. 2. Continued slight enlargement of large intraparenchymal hemorrhage within the right kidney. Follow-up with outpatient contrast-enhanced abdominal MRI is recommended to assess for an underlying mass. 3. Nonobstructing 4 mm lower pole right renal stone. No hydronephrosis. 4. Mild long segment wall thickening of the ascending and proximal transverse colon, which may reflect underlying portal colopathy. 5. Small amount of free fluid located dependently within the pelvis, new from prior. 6. Trace bilateral pleural effusions and trace pericardial effusion. 7. Cirrhotic hepatic morphology. Electronically Signed   By: Duanne Guess D.O.   On: 03/07/2021 13:04   CT ABDOMEN PELVIS WO CONTRAST  Result Date: 03-17-2021 CLINICAL DATA:  Fever with hematuria EXAM: CT ABDOMEN AND PELVIS WITHOUT CONTRAST TECHNIQUE: Multidetector CT imaging of the abdomen and pelvis was performed following the standard protocol without oral or IV contrast. COMPARISON:  None. FINDINGS: Lower chest: There are small pleural effusions bilaterally. There is scarring with lower lobe bronchiectatic change in the base regions. There are scattered foci of coronary artery calcification. Hepatobiliary: The liver contour is nodular, likely due to a degree of underlying hepatic cirrhosis. No focal liver lesions are appreciable on this noncontrast enhanced study. Gallbladder wall is not appreciably thickened. There is no biliary duct dilatation. Pancreas: There is no appreciable pancreatic mass or  inflammatory focus. Spleen: No splenic lesions are evident. There are apparent varices in the splenic region. Adrenals/Urinary Tract: Adrenals bilaterally appear normal. The right kidney appears edematous. There is mild soft tissue stranding in the perinephric fascia on the right. There is an area of increased attenuation in the upper pole the right kidney measuring 5.5 x 5.3 x 5.2 cm. This lesion has an appearance suggesting focal hemorrhage within this area. This lesion may connect with the upper pole collecting system. There is no hydronephrosis on the right. There is no demonstrable mass in the left kidney on this noncontrast enhanced study. No hydronephrosis. There is a 3 x 3 mm calculus in the lower pole the right kidney. There is no appreciable ureteral calculus on either side. Urinary bladder is midline with wall thickness within normal limits. Stomach/Bowel: There is no appreciable bowel wall or mesenteric thickening. No appreciable bowel obstruction. Terminal ileum appears normal. Appendix absent. No free air or portal venous air. Vascular/Lymphatic: No abdominal aortic aneurysm. Foci of calcification noted in each common iliac artery. No adenopathy is evident in the abdomen or pelvis. Subcentimeter inguinal lymph nodes are considered nonspecific. Reproductive: Occasional prostatic calculi noted. Prostate and seminal vesicles normal in size and contour. Other: There is a focal umbilical region hernia containing fat and mild panniculitis. This hernia at its neck measures 1.4 cm from right to left dimension and 1.5 cm from superior to inferior dimension. No bowel containing hernia is evident. There is no abscess or ascites in the abdomen or pelvis. Musculoskeletal: No blastic or lytic bone lesions. No intramuscular lesions. IMPRESSION: 1. Area of increased attenuation in the upper pole of the right kidney measuring 5.5 x 5.3 x 5.2 cm which has an appearance concerning for localized hemorrhage. Etiology for  focal hemorrhage in this area is uncertain. Hemorrhage within a mass or vascular malformation could present in this manner. This finding may warrant CT angiogram for further assessment and characterization. This lesion abuts and may invade a portion of the upper pole collecting  system which could account for hematuria. Note that the right kidney appears edematous with mild stranding in the right perinephric fascia. 2. Nonobstructing calculus in the lower pole right kidney measuring 3 x 3 mm. 3. 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 No focal liver lesions evident on noncontrast enhanced study. 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 Spleen is not enlarged. 4. Umbilical hernia containing fat and mild panniculitis. No bowel containing hernia evident. 5. No evident bowel obstruction. No abscess in the abdomen or pelvis. Appendix absent. 6. Small pleural effusions bilaterally. Areas of scarring and bronchiectasis in the lung bases. 7.  Foci of iliac artery and coronary artery calcification. Electronically Signed   By: Bretta Bang III M.D.   On: 2021-03-21 20:08   DG Chest 2 View  Result Date: 03/07/2021 CLINICAL DATA:  Hypoxic respiratory failure, tachycardia and tachypnea in a 55 year old male. EXAM: CHEST - 2 VIEW COMPARISON:  03-21-2021 FINDINGS: EKG leads project over the chest. Trachea midline. Cardiomediastinal contours and hilar structures are stable. Partially obscured LEFT hemidiaphragm in the retrocardiac region. Small LEFT effusion. Mildly increased interstitial markings. On limited assessment no acute skeletal process. IMPRESSION: Developing airspace disease at the LEFT lung base associated with effusion, may represent worsening atelectasis or developing infection. Increased interstitial markings slightly increased could represent sequela of heart failure or pneumonitis. Electronically Signed   By: Donzetta Kohut M.D.   On: 03/07/2021  08:15   DG Wrist Complete Left  Result Date: 03/05/2021 CLINICAL DATA:  Left wrist pain for years. Base of first metacarpal phalangeal joint pain. EXAM: LEFT WRIST - COMPLETE 3+ VIEW COMPARISON:  None. FINDINGS: No signs of acute fracture or dislocation. There is narrowing of the radiocarpal joint with degenerative changes at the radiocarpal and distal radioulnar joint. Mild degenerative changes are also noted at the basilar joint. IMPRESSION: 1. 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 2. No acute fracture or dislocation. Electronically Signed   By: Signa Kell M.D.   On: 03/05/2021 11:18   MR WRIST LEFT W WO CONTRAST  Result Date: 03/05/2021 CLINICAL DATA:  Acute on chronic left wrist pain.  MSSA bacteremia. EXAM: MR OF THE LEFT WRIST WITHOUT AND WITH CONTRAST TECHNIQUE: Multiplanar multisequence MR imaging of the left wrist was performed both before and after the administration of intravenous contrast. CONTRAST:  62mL GADAVIST GADOBUTROL 1 MMOL/ML IV SOLN COMPARISON:  Left wrist x-rays from same day. FINDINGS: Ligaments: Torn lunotriquetral ligament. Intact scapholunate ligament. Triangular fibrocartilage: Large tear of the articular disc. Tendons: Intact flexor and extensor compartment tendons. Small amount of fluid in the common flexor tendon sheath with mild synovial enhancement. Carpal tunnel/median nerve: Normal carpal tunnel. Normal median nerve. Guyon's canal: Normal. Joint/cartilage: Prominent radiocarpal joint space narrowing with extensive full-thickness cartilage loss over the proximal lunate and adjacent distal radius. Degenerative subchondral marrow edema in the volar distal radius centrally. No significant radiocarpal joint effusion. Small midcarpal and distal radioulnar joint complex effusions with thick synovial enhancement. Additional full-thickness cartilage loss over the ulnar aspect of the lunate with underlying subchondral  cystic change. Mild first CMC joint osteoarthritis. Bones/carpal alignment: Positive ulnar variance. Normal carpal alignment. No suspicious bone lesion. Other: Focal edema and enlargement of the pronator quadratus muscle with small 1.0 x 0.5 x 1.6 cm rim enhancing fluid collection between the muscle and distal radius (series 5, image 10; series 10, image 17). Prominent fascial edema between the volar  margin of the pronator quadratus muscle and flexor muscles (series 5, image 1). IMPRESSION: 1. Infectious myofasciitis of the volar wrist involving the pronator quadratus muscle with small 1.6 cm abscess between the muscle and distal radius. 2. Small midcarpal and distal radioulnar joint complex effusions with thick synovial enhancement, concerning for septic arthritis. No significant radiocarpal joint effusion. 3. Mild common flexor tenosynovitis, likely infectious in etiology given proximity to pronator quadratus myofasciitis. 4. 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 Electronically Signed   By: Obie Dredge M.D.   On: 03/05/2021 14:31   DG Chest Port 1 View  Result Date: 03-31-21 CLINICAL DATA:  Dyspnea EXAM: PORTABLE CHEST 1 VIEW COMPARISON:  03/07/2021 FINDINGS: Cardiac shadow is within normal limits. The lungs are well aerated bilaterally. No focal infiltrate or sizable effusion is seen. The vascular congestion has improved when compared with the prior study. Overall aeration has improved as well. IMPRESSION: Improved aeration with decreased vascular congestion. No new focal abnormality is noted. Electronically Signed   By: Alcide Clever M.D.   On: 31-Mar-2021 17:35   DG CHEST PORT 1 VIEW  Result Date: 03/07/2021 CLINICAL DATA:  Shortness of breath EXAM: PORTABLE CHEST 1 VIEW  COMPARISON:  Three days ago FINDINGS: Normal heart size for technique. Possible vascular pedicle widening. Diffuse interstitial prominence with hazy density at both lung bases. Negative for pneumothorax. IMPRESSION: Vascular congestion and atelectasis at the lung bases. Electronically Signed   By: Marnee Spring M.D.   On: 03/07/2021 04:02   DG Chest Port 1 View  Result Date: 03/13/2021 CLINICAL DATA:  Weakness EXAM: PORTABLE CHEST 1 VIEW COMPARISON:  None. FINDINGS: There is mild fullness of the AP window, possibly due to technique. Heart is normal in size. No pleural effusion. No pneumothorax. No acute pleuroparenchymal abnormality. Visualized abdomen is unremarkable. No acute osseous abnormality noted. IMPRESSION: 1. Mild fullness of the AP window, likely due to technique. Consider repeat PA and lateral chest radiograph for improved evaluation. 2. Otherwise no acute cardiopulmonary abnormality. Electronically Signed   By: Meda Klinefelter MD   On: 03/15/2021 16:29   ECHOCARDIOGRAM COMPLETE  Result Date: 03/05/2021    ECHOCARDIOGRAM REPORT   Patient Name:   [facility] Date of Exam: 03/05/2021 Medical Rec #:  [id number]       Height:       69.0 in Accession #:    [id number]      Weight:       144.2 lb Date of Birth:  1966-03-12       BSA:          1.798 m Patient Age:    54 years        BP:           138/76 mmHg Patient Gender: M               HR:           112 bpm. Exam Location:  Inpatient Procedure: 2D Echo, Cardiac Doppler and Color Doppler Indications:    Bacteremia R78.81  History:        Patient has no prior history of Echocardiogram examinations.  Sonographer:    Elmarie Shiley Dance Referring Phys: [id number] CORNELIUS N VAN DAM IMPRESSIONS  1. Left ventricular ejection fraction, by estimation, is 60 to 65%. The left ventricle has normal function. The  left ventricle has no regional wall motion abnormalities. Left ventricular diastolic parameters were normal.  2. Right ventricular systolic function is  normal. The right ventricular size is normal. Tricuspid regurgitation signal is inadequate for assessing PA pressure.  3. Left atrial size was moderately dilated.  4. The mitral valve is normal in structure. Trivial mitral valve regurgitation.  5. The aortic valve is tricuspid. Aortic valve regurgitation is not visualized. No aortic stenosis is present.  6. Aortic dilatation noted. There is mild dilatation of the ascending aorta, measuring 38 mm.  7. Possible PFO, not well visualized  8. The inferior vena cava is normal in size with greater than 50% respiratory variability, suggesting right atrial pressure of 3 mmHg. Conclusion(s)/Recommendation(s): No vegetation seen. If high clinical suspicion for endocarditis, consider TEE. FINDINGS  Left Ventricle: Left ventricular ejection fraction, by estimation, is 60 to 65%. The left ventricle has normal function. The left ventricle has no regional wall motion abnormalities. The left ventricular internal cavity size was normal in size. There is  no left ventricular hypertrophy. Left ventricular diastolic parameters were normal. Right Ventricle: The right ventricular size is normal. No increase in right ventricular wall thickness. Right ventricular systolic function is normal. Tricuspid regurgitation signal is inadequate for assessing PA pressure. Left Atrium: Left atrial size was moderately dilated. Right Atrium: Right atrial size was normal in size. Pericardium: There is no evidence of pericardial effusion. Mitral Valve: The mitral valve is normal in structure. Trivial mitral valve regurgitation. Tricuspid Valve: The tricuspid valve is normal in structure. Tricuspid valve regurgitation is trivial. Aortic Valve: The aortic valve is tricuspid. Aortic valve regurgitation is not visualized. No aortic stenosis is present. Pulmonic Valve: The pulmonic valve was not well visualized. Pulmonic valve regurgitation is not visualized. Aorta: The aortic root is normal in size and  structure and aortic dilatation noted. There is mild dilatation of the ascending aorta, measuring 38 mm. Venous: The inferior vena cava is normal in size with greater than 50% respiratory variability, suggesting right atrial pressure of 3 mmHg. IAS/Shunts: Evidence of atrial level shunting detected by color flow Doppler.  LEFT VENTRICLE PLAX 2D LVIDd:         5.00 cm  Diastology LVIDs:         2.80 cm  LV e' medial:    10.10 cm/s LV PW:         1.00 cm  LV E/e' medial:  13.9 LV IVS:        0.80 cm  LV e' lateral:   10.20 cm/s LVOT diam:     2.00 cm  LV E/e' lateral: 13.7 LV SV:         89 LV SV Index:   49 LVOT Area:     3.14 cm  RIGHT VENTRICLE             IVC RV Basal diam:  2.80 cm     IVC diam: 1.70 cm RV S prime:     18.20 cm/s TAPSE (M-mode): 2.4 cm LEFT ATRIUM             Index       RIGHT ATRIUM           Index LA diam:        5.50 cm 3.06 cm/m  RA Area:     13.20 cm LA Vol (A2C):   66.3 ml 36.88 ml/m RA Volume:   30.00 ml  16.69 ml/m LA Vol (A4C):   85.5 ml 47.56 ml/m  LA Biplane Vol: 76.3 ml 42.44 ml/m  AORTIC VALVE LVOT Vmax:   153.00 cm/s LVOT Vmean:  107.000 cm/s LVOT VTI:    0.283 m  AORTA Ao Root diam: 3.40 cm Ao Asc diam:  3.80 cm MITRAL VALVE MV Area (PHT): 4.21 cm     SHUNTS MV Decel Time: 180 msec     Systemic VTI:  0.28 m MV E velocity: 140.00 cm/s  Systemic Diam: 2.00 cm MV A velocity: 133.00 cm/s MV E/A ratio:  1.05 Epifanio Lescheshristopher Schumann MD Electronically signed by Epifanio Lescheshristopher Schumann MD Signature Date/Time: 03/05/2021/2:38:22 PM    Final    US EKG SITE RITE  Result Date: 03/09/2021 If Site Rite image not attached, placement could not be confirmed due to current cardiac rhythm.  CT Angio Abd/Pel w/ and/or w/o  Result Date: 03/05/2021 CLINICAL DATA:  Right renal hemorrhage, flank pain, hematuria EXAM: CTA ABDOMEN AND PELVIS WITH CONTRAST TECHNIQUE: Multidetector CT imaging of the abdomen and pelvis was performed using the standard protocol during bolus administration of intravenous  contrast. Multiplanar reconstructed images and MIPs were obtained and reviewed to evaluate the vascular anatomy. CONTRAST:  100mL OMNIPAQUE IOHEXOL 350 MG/ML SOLN COMPARISON:  The previous day's study FINDINGS: VASCULAR Aorta: Normal caliber aorta without aneurysm, dissection, vasculitis or significant stenosis. Celiac: Partially calcified ostial plaque with short-segment stenosis of only mild severity, patent and unremarkable distally. SMA: Patent without evidence of aneurysm, dissection, vasculitis or significant stenosis. Replaced right hepatic arterial supply, an anatomic variant. Renals: Single right, widely patent. No pseudoaneurysm, active extravasation, or other vascular lesion evident. Single left, with calcified ostial plaque, no high-grade stenosis, unremarkable distally. IMA: Patent without evidence of aneurysm, dissection, vasculitis or significant stenosis. Inflow: Calcified plaque at the aortic bifurcation without aneurysm or stenosis, unremarkable distally. Proximal Outflow: Mildly atheromatous, patent. Veins: Prominent left retroperitoneal splenorenal shunt. Small enhancing gastric varices are evident on venous phase. Portal vein patent. SMV patent. Splenic vein patent. Hepatic veins patent. Review of the MIP images confirms the above findings. NON-VASCULAR Lower chest: Dependent atelectasis/consolidation in the lung bases. No pleural or pericardial effusion. Hepatobiliary: Nodular liver contour. No focal lesion or biliary ductal dilatation. Gallbladder unremarkable. Pancreas: Unremarkable. No pancreatic ductal dilatation or surrounding inflammatory changes. Spleen: 11.2 cm maximum diameter.  No focal lesion. Adrenals/Urinary Tract: Adrenal glands unremarkable. Left kidney unremarkable. 6.3 cm right upper pole renal intraparenchymal hematoma (previously 5.9). No active arterial extravasation. No aneurysm or other evident etiology of the collection. 8 mm right lower pole collecting system calculus.  No hydronephrosis. Urinary bladder physiologically distended. Stomach/Bowel: Stomach, small bowel, and colon are decompressed. Lymphatic: No abdominal or pelvic adenopathy. Reproductive: Mild prostate enlargement with central coarse calcifications Other: No ascites.  No free air. Musculoskeletal: Moderate paraumbilical hernia containing only mesenteric fat. Regional bones unremarkable. IMPRESSION: 1. Minimal enlargement of right upper pole intraparenchymal renal hematoma, with no active extravasation, pseudoaneurysm, or other evident etiology. After resolution of hematoma, consider elective renal MR with contrast to exclude underlying lesion. 2. Nodular hepatic contour suggesting cirrhosis, with small esophageal varices and a large decompressive splenorenal venous shunt. 3. Right nephrolithiasis without hydronephrosis. Electronically Signed   By: Corlis Leak  Hassell M.D.   On: 03/05/2021 14:38    Microbiology: No results found for this or any previous visit (from the past 240 hour(s)).   Labs: Basic Metabolic Panel: No results for input(s): NA, K, CL, CO2, GLUCOSE, BUN, CREATININE, CALCIUM, MG, PHOS in the last 168 hours. Liver Function Tests: No results for input(s): AST, ALT, ALKPHOS,  BILITOT, PROT, ALBUMIN in the last 168 hours. No results for input(s): LIPASE, AMYLASE in the last 168 hours. No results for input(s): AMMONIA in the last 168 hours. CBC: No results for input(s): WBC, NEUTROABS, HGB, HCT, MCV, PLT in the last 168 hours. Cardiac Enzymes: No results for input(s): CKTOTAL, CKMB, CKMBINDEX, TROPONINI in the last 168 hours. D-Dimer No results for input(s): DDIMER in the last 72 hours. BNP: Invalid input(s): POCBNP CBG: No results for input(s): GLUCAP in the last 168 hours. Anemia work up No results for input(s): VITAMINB12, FOLATE, FERRITIN, TIBC, IRON, RETICCTPCT in the last 72 hours. Urinalysis    Component Value Date/Time   COLORURINE RED (A) 03/20/2021 1528   APPEARANCEUR HAZY (A)  03/03/2021 1528   LABSPEC 1.011 03/22/2021 1528   PHURINE 6.0 03/20/2021 1528   GLUCOSEU 50 (A) 02/28/2021 1528   HGBUR MODERATE (A) 02/26/2021 1528   BILIRUBINUR NEGATIVE 03/03/2021 1528   KETONESUR NEGATIVE 03/02/2021 1528   PROTEINUR 100 (A) 03/03/2021 1528   NITRITE NEGATIVE 03/15/2021 1528   LEUKOCYTESUR NEGATIVE 02/26/2021 1528   Sepsis Labs Invalid input(s): PROCALCITONIN,  WBC,  LACTICIDVEN     SIGNED:  Darlin Drop, MD  [facility] 03/24/2021, 10:51 PM Pager   If 7PM-7AM, please contact night-coverage [email or web address] Password [email or web address]

## 2023-05-13 IMAGING — DX DG CHEST 2V
2 series · 2 of 2 positions shown · non-contrast
Comparison: March 04, 2021

CLINICAL DATA: Hypoxic respiratory failure, tachycardia and
tachypnea in a 54-year-old male.

EXAM:
CHEST - 2 VIEW

[chest ap]
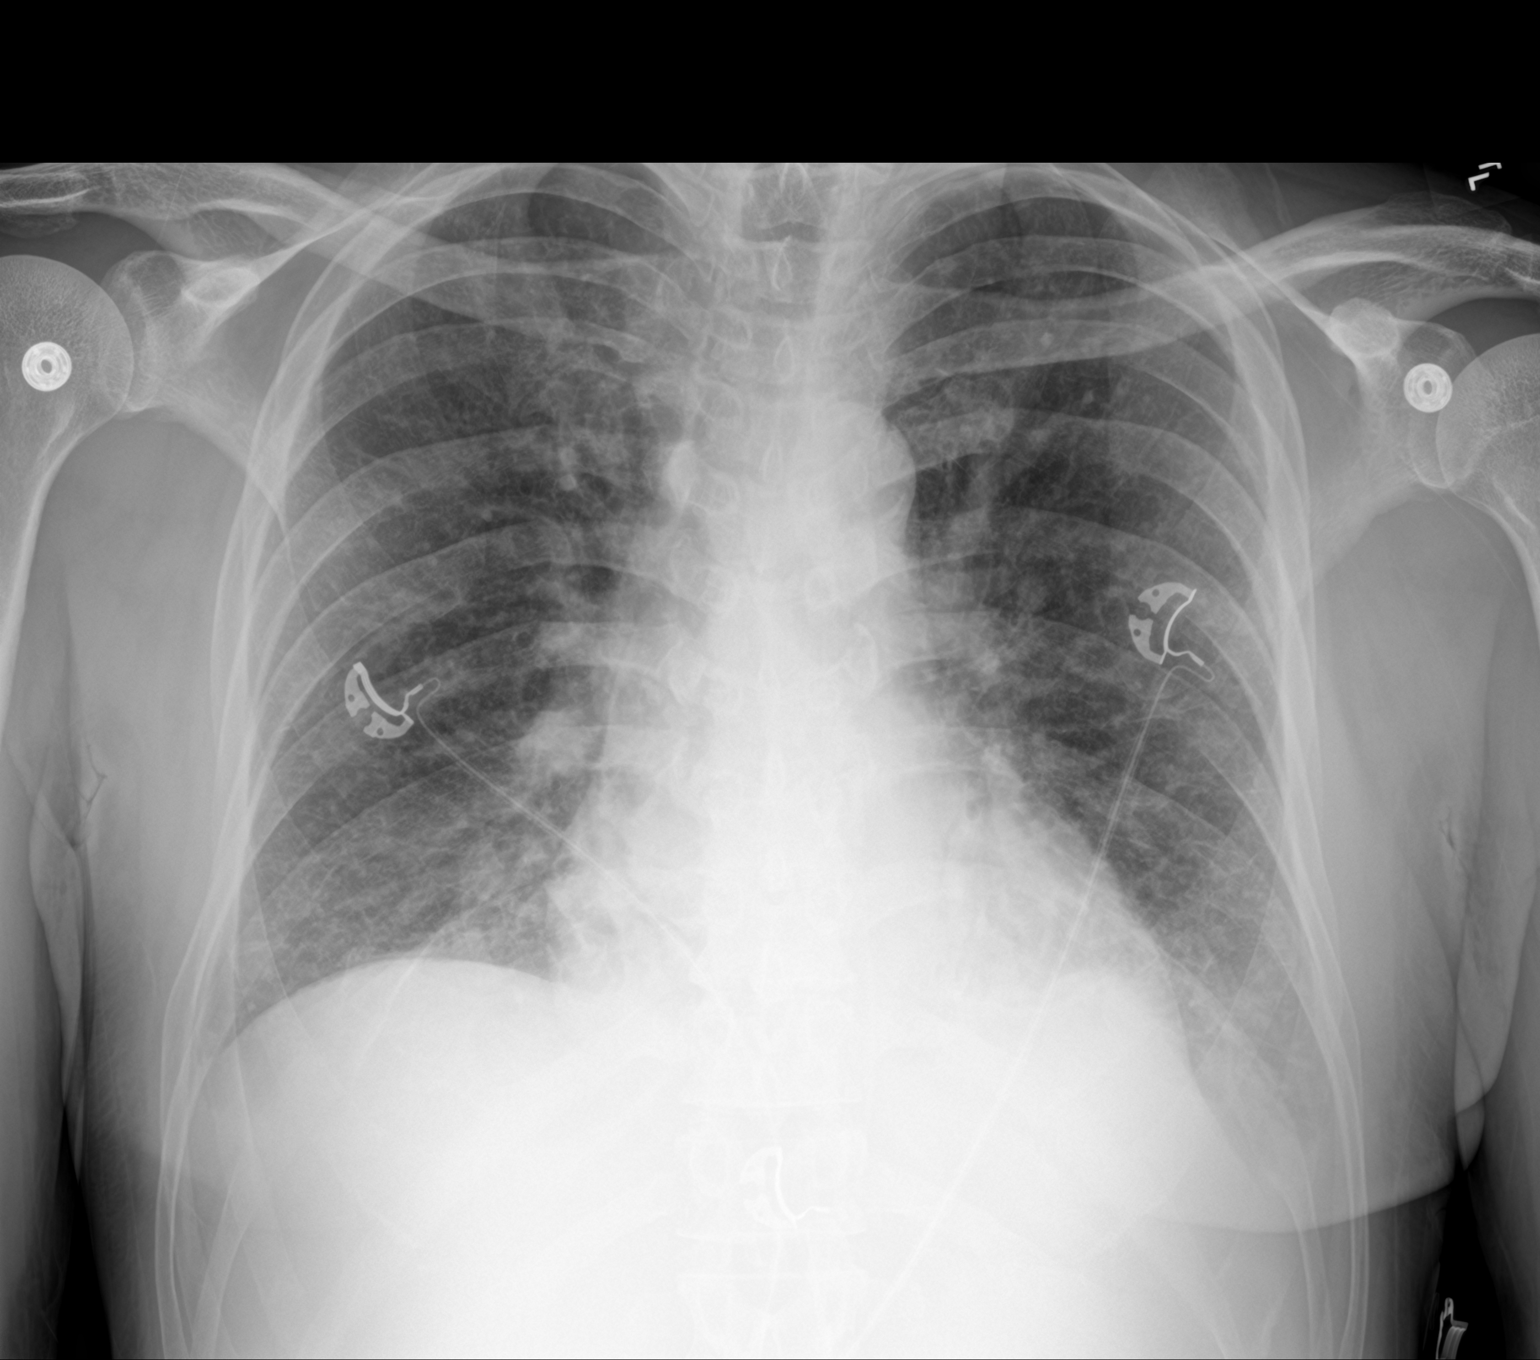

[chest lat]
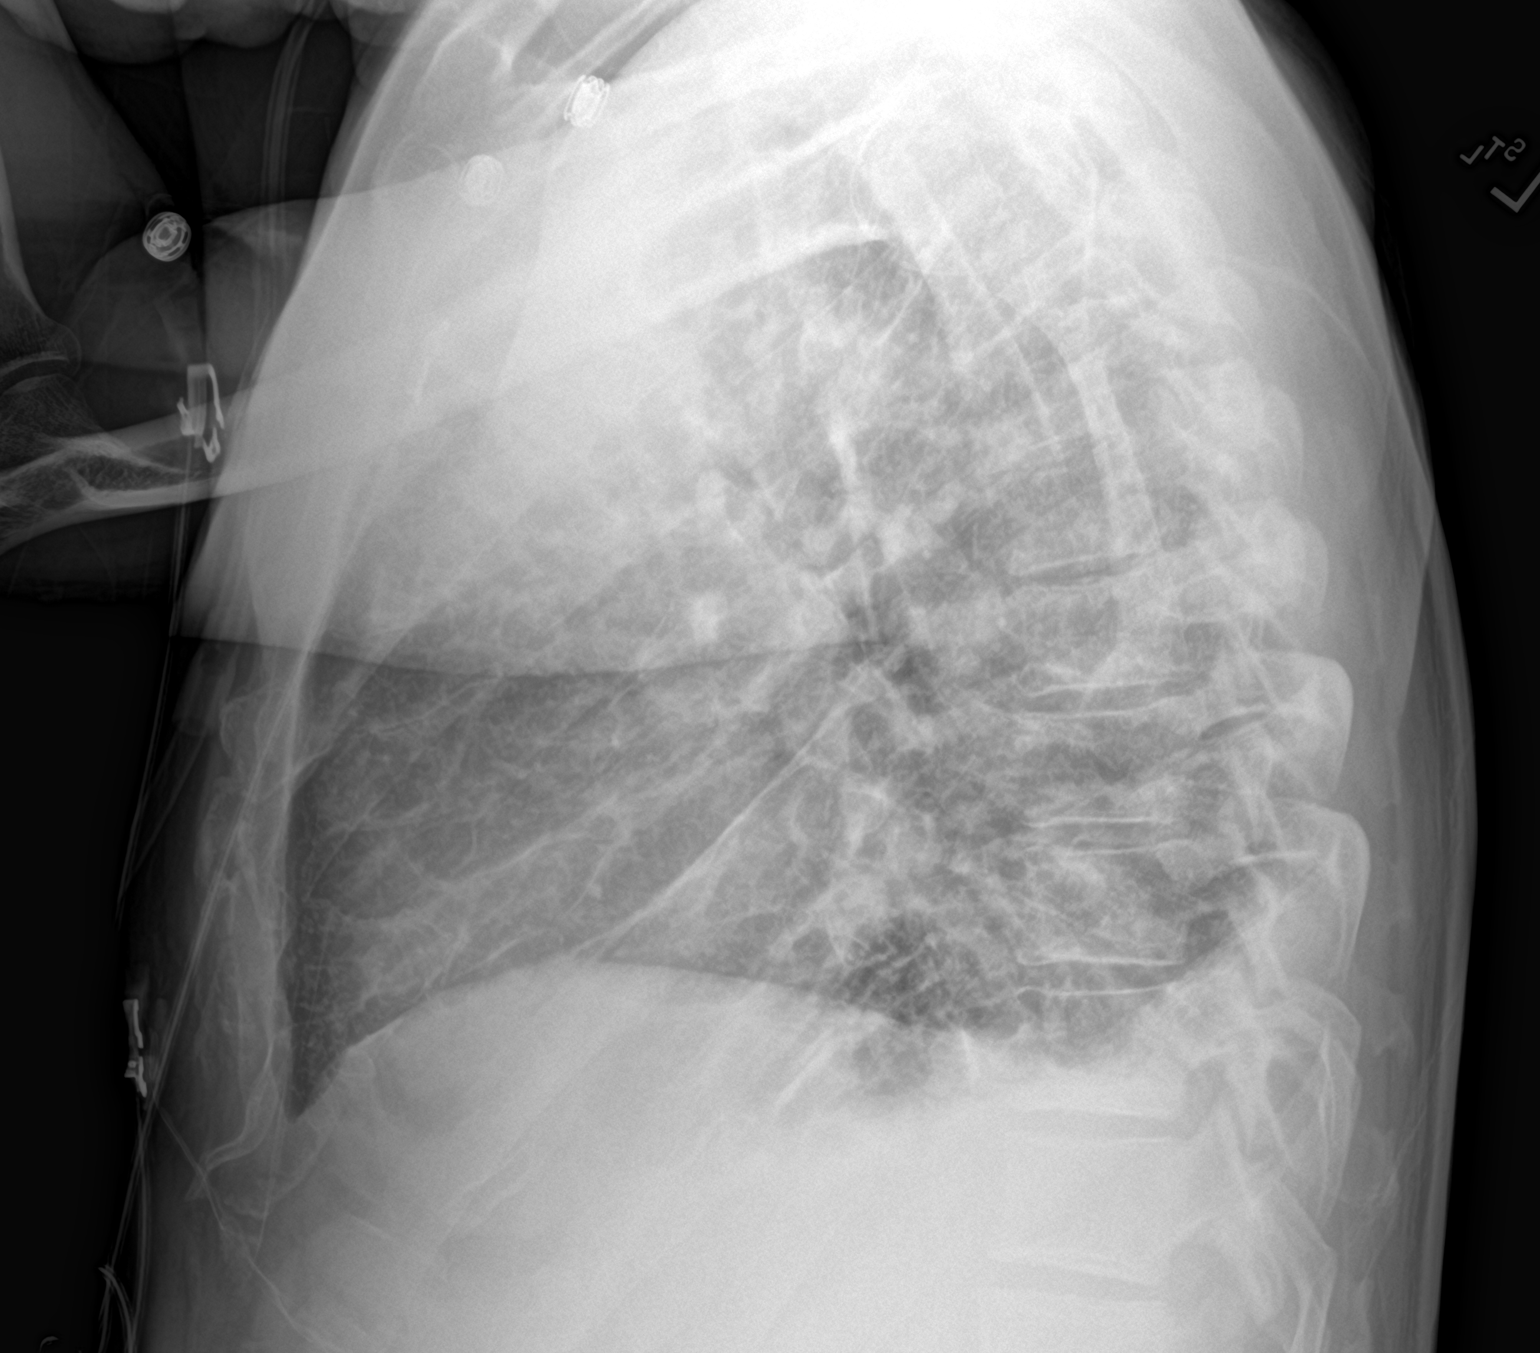

[2 of 2 positions shown; findings below may reference images not displayed]

FINDINGS: EKG leads project over the chest.

Trachea midline.

Cardiomediastinal contours and hilar structures are stable.

Partially obscured LEFT hemidiaphragm in the retrocardiac region.

Small LEFT effusion.

Mildly increased interstitial markings.

On limited assessment no acute skeletal process.
IMPRESSION: Developing airspace disease at the LEFT lung base associated with
effusion, may represent worsening atelectasis or developing
infection.

Increased interstitial markings slightly increased could represent
sequela of heart failure or pneumonitis.
